# Patient Record
Sex: Female | Born: 1945
Health system: Southern US, Community
[De-identification: ages and names within clinical notes are randomized; demographics above are authoritative.]

## PROBLEM LIST (undated history)

## (undated) DIAGNOSIS — I700 Atherosclerosis of aorta: Secondary | ICD-10-CM

## (undated) DIAGNOSIS — Z87891 Personal history of nicotine dependence: Secondary | ICD-10-CM

## (undated) DIAGNOSIS — D500 Iron deficiency anemia secondary to blood loss (chronic): Secondary | ICD-10-CM

## (undated) DIAGNOSIS — K449 Diaphragmatic hernia without obstruction or gangrene: Secondary | ICD-10-CM

## (undated) DIAGNOSIS — E785 Hyperlipidemia, unspecified: Secondary | ICD-10-CM

## (undated) DIAGNOSIS — Z9889 Other specified postprocedural states: Secondary | ICD-10-CM

## (undated) DIAGNOSIS — Z7982 Long term (current) use of aspirin: Secondary | ICD-10-CM

## (undated) DIAGNOSIS — R0602 Shortness of breath: Secondary | ICD-10-CM

## (undated) DIAGNOSIS — M1990 Unspecified osteoarthritis, unspecified site: Secondary | ICD-10-CM

## (undated) DIAGNOSIS — I739 Peripheral vascular disease, unspecified: Secondary | ICD-10-CM

## (undated) DIAGNOSIS — R053 Chronic cough: Secondary | ICD-10-CM

## (undated) DIAGNOSIS — H9192 Unspecified hearing loss, left ear: Secondary | ICD-10-CM

## (undated) DIAGNOSIS — K257 Chronic gastric ulcer without hemorrhage or perforation: Secondary | ICD-10-CM

## (undated) DIAGNOSIS — I10 Essential (primary) hypertension: Secondary | ICD-10-CM

## (undated) DIAGNOSIS — K219 Gastro-esophageal reflux disease without esophagitis: Secondary | ICD-10-CM

## (undated) DIAGNOSIS — Z7902 Long term (current) use of antithrombotics/antiplatelets: Secondary | ICD-10-CM

## (undated) DIAGNOSIS — R05 Cough: Secondary | ICD-10-CM

## (undated) DIAGNOSIS — D649 Anemia, unspecified: Secondary | ICD-10-CM

## (undated) DIAGNOSIS — J42 Unspecified chronic bronchitis: Secondary | ICD-10-CM

## (undated) HISTORY — PX: BUNIONECTOMY: SHX129

## (undated) HISTORY — DX: Hyperlipidemia, unspecified: E78.5

## (undated) HISTORY — DX: Gastro-esophageal reflux disease without esophagitis: K21.9

## (undated) HISTORY — DX: Essential (primary) hypertension: I10

## (undated) HISTORY — PX: TUBAL LIGATION: SHX77

## (undated) HISTORY — PX: BREAST CYST ASPIRATION: SHX578

---
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
## 1976-04-16 HISTORY — PX: BREAST EXCISIONAL BIOPSY: SUR124

## 2000-04-16 LAB — HM COLONOSCOPY

## 2004-05-09 ENCOUNTER — Ambulatory Visit: Payer: Self-pay | Admitting: Internal Medicine

## 2005-02-16 ENCOUNTER — Ambulatory Visit: Payer: Self-pay | Admitting: Internal Medicine

## 2006-10-25 ENCOUNTER — Telehealth (INDEPENDENT_AMBULATORY_CARE_PROVIDER_SITE_OTHER): Payer: Self-pay | Admitting: *Deleted

## 2008-02-10 ENCOUNTER — Ambulatory Visit: Payer: Self-pay | Admitting: Family Medicine

## 2008-03-31 ENCOUNTER — Ambulatory Visit: Payer: Self-pay | Admitting: Family Medicine

## 2008-03-31 ENCOUNTER — Telehealth (INDEPENDENT_AMBULATORY_CARE_PROVIDER_SITE_OTHER): Payer: Self-pay | Admitting: *Deleted

## 2008-03-31 DIAGNOSIS — J069 Acute upper respiratory infection, unspecified: Secondary | ICD-10-CM | POA: Insufficient documentation

## 2008-04-04 ENCOUNTER — Encounter: Payer: Self-pay | Admitting: Family Medicine

## 2008-04-04 ENCOUNTER — Emergency Department: Payer: Self-pay | Admitting: Emergency Medicine

## 2008-04-06 ENCOUNTER — Ambulatory Visit: Payer: Self-pay | Admitting: Family Medicine

## 2008-04-06 DIAGNOSIS — I10 Essential (primary) hypertension: Secondary | ICD-10-CM | POA: Insufficient documentation

## 2008-04-06 DIAGNOSIS — R079 Chest pain, unspecified: Secondary | ICD-10-CM | POA: Insufficient documentation

## 2008-04-13 ENCOUNTER — Ambulatory Visit: Payer: Self-pay

## 2008-04-13 ENCOUNTER — Encounter: Payer: Self-pay | Admitting: Family Medicine

## 2008-04-19 ENCOUNTER — Ambulatory Visit: Payer: Self-pay | Admitting: Internal Medicine

## 2008-04-19 DIAGNOSIS — K219 Gastro-esophageal reflux disease without esophagitis: Secondary | ICD-10-CM | POA: Insufficient documentation

## 2008-04-22 LAB — CONVERTED CEMR LAB
ALT: 26 units/L (ref 0–35)
AST: 28 units/L (ref 0–37)
Albumin: 4.2 g/dL (ref 3.5–5.2)
Alkaline Phosphatase: 81 units/L (ref 39–117)
BUN: 26 mg/dL — ABNORMAL HIGH (ref 6–23)
Basophils Absolute: 0 10*3/uL (ref 0.0–0.1)
Basophils Relative: 0 % (ref 0.0–3.0)
Bilirubin, Direct: 0.1 mg/dL (ref 0.0–0.3)
CO2: 30 meq/L (ref 19–32)
Calcium: 9.3 mg/dL (ref 8.4–10.5)
Chloride: 102 meq/L (ref 96–112)
Cholesterol: 237 mg/dL (ref 0–200)
Creatinine, Ser: 1.5 mg/dL — ABNORMAL HIGH (ref 0.4–1.2)
Direct LDL: 174.1 mg/dL
Eosinophils Absolute: 0.1 10*3/uL (ref 0.0–0.7)
Eosinophils Relative: 1.3 % (ref 0.0–5.0)
GFR calc Af Amer: 45 mL/min
GFR calc non Af Amer: 37 mL/min
Glucose, Bld: 104 mg/dL — ABNORMAL HIGH (ref 70–99)
HCT: 44.1 % (ref 36.0–46.0)
HDL: 41.6 mg/dL (ref >39.0)
Hemoglobin: 15.1 g/dL — ABNORMAL HIGH (ref 12.0–15.0)
Lymphocytes Relative: 21 % (ref 12.0–46.0)
MCHC: 34.3 g/dL (ref 30.0–36.0)
MCV: 92.9 fL (ref 78.0–100.0)
Monocytes Absolute: 0.3 10*3/uL (ref 0.1–1.0)
Monocytes Relative: 4.1 % (ref 3.0–12.0)
Neutro Abs: 5.4 10*3/uL (ref 1.4–7.7)
Neutrophils Relative %: 73.6 % (ref 43.0–77.0)
Phosphorus: 3.3 mg/dL (ref 2.3–4.6)
Platelets: 265 10*3/uL (ref 150–400)
Potassium: 3.6 meq/L (ref 3.5–5.1)
RBC: 4.75 M/uL (ref 3.87–5.11)
RDW: 13.1 % (ref 11.5–14.6)
Sodium: 140 meq/L (ref 135–145)
TSH: 1.06 microintl units/mL (ref 0.35–5.50)
Total Bilirubin: 0.8 mg/dL (ref 0.3–1.2)
Total CHOL/HDL Ratio: 5.7
Total Protein: 6.6 g/dL (ref 6.0–8.3)
Triglycerides: 143 mg/dL (ref 0–149)
VLDL: 29 mg/dL (ref 0–40)
WBC: 7.4 10*3/uL (ref 4.5–10.5)

## 2008-10-25 ENCOUNTER — Ambulatory Visit: Payer: Self-pay | Admitting: Internal Medicine

## 2008-10-25 DIAGNOSIS — E785 Hyperlipidemia, unspecified: Secondary | ICD-10-CM | POA: Insufficient documentation

## 2008-10-27 LAB — CONVERTED CEMR LAB
Albumin: 4 g/dL (ref 3.5–5.2)
BUN: 26 mg/dL — ABNORMAL HIGH (ref 6–23)
CO2: 29 meq/L (ref 19–32)
Calcium: 9.7 mg/dL (ref 8.4–10.5)
Chloride: 103 meq/L (ref 96–112)
Creatinine, Ser: 1.1 mg/dL (ref 0.4–1.2)
Glucose, Bld: 85 mg/dL (ref 70–99)
Phosphorus: 4 mg/dL (ref 2.3–4.6)
Potassium: 3.9 meq/L (ref 3.5–5.1)
Sodium: 141 meq/L (ref 135–145)

## 2009-04-25 ENCOUNTER — Ambulatory Visit: Payer: Self-pay | Admitting: Internal Medicine

## 2009-04-25 DIAGNOSIS — N3000 Acute cystitis without hematuria: Secondary | ICD-10-CM | POA: Insufficient documentation

## 2009-04-25 LAB — CONVERTED CEMR LAB
Bilirubin Urine: NEGATIVE
Glucose, Urine, Semiquant: NEGATIVE
Ketones, urine, test strip: NEGATIVE
Nitrite: NEGATIVE
Specific Gravity, Urine: 1.01
Urobilinogen, UA: 0.2
pH: 6.5

## 2009-04-27 LAB — CONVERTED CEMR LAB
ALT: 24 units/L (ref 0–35)
AST: 26 units/L (ref 0–37)
Albumin: 4 g/dL (ref 3.5–5.2)
Alkaline Phosphatase: 69 units/L (ref 39–117)
BUN: 25 mg/dL — ABNORMAL HIGH (ref 6–23)
Basophils Absolute: 0.1 10*3/uL (ref 0.0–0.1)
Basophils Relative: 0.9 % (ref 0.0–3.0)
Bilirubin, Direct: 0 mg/dL (ref 0.0–0.3)
CO2: 31 meq/L (ref 19–32)
Calcium: 10.2 mg/dL (ref 8.4–10.5)
Chloride: 102 meq/L (ref 96–112)
Creatinine, Ser: 1.1 mg/dL (ref 0.4–1.2)
Eosinophils Absolute: 0.1 10*3/uL (ref 0.0–0.7)
Eosinophils Relative: 1.5 % (ref 0.0–5.0)
GFR calc non Af Amer: 53.09 mL/min (ref >60)
Glucose, Bld: 88 mg/dL (ref 70–99)
HCT: 44.3 % (ref 36.0–46.0)
Hemoglobin: 14.6 g/dL (ref 12.0–15.0)
Lymphocytes Relative: 20.2 % (ref 12.0–46.0)
Lymphs Abs: 1.7 10*3/uL (ref 0.7–4.0)
MCHC: 33 g/dL (ref 30.0–36.0)
MCV: 96.5 fL (ref 78.0–100.0)
Monocytes Absolute: 0.5 10*3/uL (ref 0.1–1.0)
Monocytes Relative: 5.8 % (ref 3.0–12.0)
Neutro Abs: 6 10*3/uL (ref 1.4–7.7)
Neutrophils Relative %: 71.6 % (ref 43.0–77.0)
Phosphorus: 3.2 mg/dL (ref 2.3–4.6)
Platelets: 233 10*3/uL (ref 150.0–400.0)
Potassium: 3.9 meq/L (ref 3.5–5.1)
RBC: 4.59 M/uL (ref 3.87–5.11)
RDW: 13.1 % (ref 11.5–14.6)
Sodium: 142 meq/L (ref 135–145)
TSH: 1.85 microintl units/mL (ref 0.35–5.50)
Total Bilirubin: 1 mg/dL (ref 0.3–1.2)
Total Protein: 7.2 g/dL (ref 6.0–8.3)
WBC: 8.4 10*3/uL (ref 4.5–10.5)

## 2009-05-13 ENCOUNTER — Ambulatory Visit: Payer: Self-pay | Admitting: Internal Medicine

## 2009-05-13 ENCOUNTER — Encounter: Payer: Self-pay | Admitting: Internal Medicine

## 2009-05-25 ENCOUNTER — Encounter: Payer: Self-pay | Admitting: Internal Medicine

## 2009-10-24 ENCOUNTER — Ambulatory Visit: Payer: Self-pay | Admitting: Internal Medicine

## 2009-10-24 DIAGNOSIS — F438 Other reactions to severe stress: Secondary | ICD-10-CM | POA: Insufficient documentation

## 2010-04-13 ENCOUNTER — Encounter: Payer: Self-pay | Admitting: Internal Medicine

## 2010-05-01 ENCOUNTER — Encounter: Payer: Self-pay | Admitting: Internal Medicine

## 2010-05-01 ENCOUNTER — Ambulatory Visit
Admission: RE | Admit: 2010-05-01 | Discharge: 2010-05-01 | Payer: Self-pay | Source: Home / Self Care | Attending: Internal Medicine | Admitting: Internal Medicine

## 2010-05-01 ENCOUNTER — Encounter (INDEPENDENT_AMBULATORY_CARE_PROVIDER_SITE_OTHER): Payer: Self-pay | Admitting: *Deleted

## 2010-05-01 ENCOUNTER — Other Ambulatory Visit: Payer: Self-pay | Admitting: Internal Medicine

## 2010-05-01 LAB — RENAL FUNCTION PANEL
Albumin: 4.2 g/dL (ref 3.5–5.2)
BUN: 19 mg/dL (ref 6–23)
CO2: 29 mEq/L (ref 19–32)
Calcium: 9.7 mg/dL (ref 8.4–10.5)
Chloride: 99 mEq/L (ref 96–112)
Creatinine, Ser: 1 mg/dL (ref 0.4–1.2)
GFR: 59.07 mL/min — ABNORMAL LOW (ref >60.00)
Glucose, Bld: 87 mg/dL (ref 70–99)
Phosphorus: 3.2 mg/dL (ref 2.3–4.6)
Potassium: 3.5 mEq/L (ref 3.5–5.1)
Sodium: 139 mEq/L (ref 135–145)

## 2010-05-01 LAB — CBC WITH DIFFERENTIAL/PLATELET
Basophils Absolute: 0 10*3/uL (ref 0.0–0.1)
Basophils Relative: 0.4 % (ref 0.0–3.0)
Eosinophils Absolute: 0.1 10*3/uL (ref 0.0–0.7)
Eosinophils Relative: 1 % (ref 0.0–5.0)
HCT: 47.2 % — ABNORMAL HIGH (ref 36.0–46.0)
Hemoglobin: 15.9 g/dL — ABNORMAL HIGH (ref 12.0–15.0)
Lymphocytes Relative: 17.6 % (ref 12.0–46.0)
Lymphs Abs: 1.8 10*3/uL (ref 0.7–4.0)
MCHC: 33.7 g/dL (ref 30.0–36.0)
MCV: 94.8 fl (ref 78.0–100.0)
Monocytes Absolute: 0.6 10*3/uL (ref 0.1–1.0)
Monocytes Relative: 5.4 % (ref 3.0–12.0)
Neutro Abs: 7.9 10*3/uL — ABNORMAL HIGH (ref 1.4–7.7)
Neutrophils Relative %: 75.6 % (ref 43.0–77.0)
Platelets: 274 10*3/uL (ref 150.0–400.0)
RBC: 4.97 Mil/uL (ref 3.87–5.11)
RDW: 13.9 % (ref 11.5–14.6)
WBC: 10.5 10*3/uL (ref 4.5–10.5)

## 2010-05-01 LAB — LDL CHOLESTEROL, DIRECT: Direct LDL: 187.2 mg/dL

## 2010-05-01 LAB — HEPATIC FUNCTION PANEL
ALT: 21 U/L (ref 0–35)
AST: 22 U/L (ref 0–37)
Albumin: 4.2 g/dL (ref 3.5–5.2)
Alkaline Phosphatase: 86 U/L (ref 39–117)
Bilirubin, Direct: 0.1 mg/dL (ref 0.0–0.3)
Total Bilirubin: 1 mg/dL (ref 0.3–1.2)
Total Protein: 6.9 g/dL (ref 6.0–8.3)

## 2010-05-01 LAB — TSH: TSH: 1.24 u[IU]/mL (ref 0.35–5.50)

## 2010-05-01 LAB — LIPID PANEL
Cholesterol: 258 mg/dL — ABNORMAL HIGH (ref 0–200)
HDL: 50.8 mg/dL (ref >39.00)
Total CHOL/HDL Ratio: 5
Triglycerides: 153 mg/dL — ABNORMAL HIGH (ref 0.0–149.0)
VLDL: 30.6 mg/dL (ref 0.0–40.0)

## 2010-05-03 ENCOUNTER — Encounter: Payer: Self-pay | Admitting: Internal Medicine

## 2010-05-16 NOTE — Letter (Signed)
Summary: Results Follow up Letter  [facility]  [address] [place], [place] [address]   Phone: [phone]  Fax: [phone]    05/25/2009 MRN: [id number]  Meghan Moody [address] [place], [place]  [address]  Dear [name],  The following are the results of your recent test(s):  Test         Result    Pap Smear:        Normal _____  Not Normal _____ Comments: ______________________________________________________ Cholesterol: LDL(Bad cholesterol):         Your goal is less than:         HDL (Good cholesterol):       Your goal is more than: Comments:  ______________________________________________________ Mammogram:        Normal __X___  Not Normal _____ Comments:mammo looks fine. Repeat is recommended in 1-2 years   ___________________________________________________________________ Hemoccult:        Normal _____  Not normal _______ Comments:    _____________________________________________________________________ Other Tests:    We routinely do not discuss normal results over the telephone.  If you desire a copy of the results, or you have any questions about this information we can discuss them at your next office visit.   Sincerely,     Tillman Abide, MD

## 2010-05-16 NOTE — Assessment & Plan Note (Signed)
Summary: CPX / LFW   Vital Signs:  Patient profile:   65 year old female Weight:      150 pounds Temp:     98.6 degrees F oral Pulse rate:   68 / minute Pulse rhythm:   regular BP sitting:   138 / 90  (left arm) Cuff size:   regular  Vitals Entered By: Mervin Hack CMA Duncan Dull) (April 25, 2009 11:54 AM) CC: adult physical   History of Present Illness: Urine checked Very yellow---takes a fair amount of vitamins Does have bad aroma No dysuria or hematuria  Otherwise fine Hasn't been to her gyn in some time Still hasn't been back to Dr Judge Stall care here--she prefers to defer to Dr Fenton Malling Will allow mammo though  Allergies: No Known Drug Allergies  Past History:  Past medical, surgical, family and social histories (including risk factors) reviewed for relevance to current acute and chronic problems.  Past Medical History: Reviewed history from 10/25/2008 and no changes required. Hypertension GERD Hyperlipidemia  Past Surgical History: Reviewed history from 04/19/2008 and no changes required. Tubal ligation Bunionectomy  Family History: Reviewed history from 04/19/2008 and no changes required. Dad died of stroke in 45's Mom with CAD 2 brothers--1 with drug addiction problems  Social History: Reviewed history from 10/25/2008 and no changes required. 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 Eyes:  Denies double vision and vision loss-1 eye. ENT:  Denies decreased hearing and ringing in ears; teeth okay--regular with dentist. CV:  Denies chest pain or discomfort, difficulty breathing at night, difficulty breathing while lying down, fainting, lightheadness, near fainting,  palpitations, and shortness of breath with exertion. Resp:  Complains of cough; denies shortness of breath; occ mild cough. GI:  Complains of indigestion; denies abdominal pain, bloody stools, change in bowel habits, and dark tarry stools; takes prilosec--controls heartburn unless she misses. GU:  See HPI; No sex recently--had dyspareunia. MS:  Denies joint pain and joint swelling. Derm:  Complains of lesion(s); denies rash; moles on abdomen and other places Palpable lesion left low back--not sure if it is concerning. Neuro:  Complains of tingling; denies headaches, numbness, and weakness; occ tingling in right arm in bed some cramps in thighs and calves at night. Psych:  Denies anxiety and depression. Heme:  Denies abnormal bruising and enlarge lymph nodes. Allergy:  Denies seasonal allergies and sneezing.  Physical Exam  General:  alert and normal appearance.   Eyes:  pupils equal, pupils round, pupils reactive to light, and no optic disk abnormalities.   Ears:  R ear normal and L ear normal.   Mouth:  no erythema and no lesions.   Neck:  supple, no masses, no thyromegaly, no carotid bruits, and no cervical lymphadenopathy.   Breasts:  deferred at patient's request Lungs:  normal respiratory effort and normal breath sounds.   Heart:  normal rate, regular rhythm, no murmur, and no gallop.   Abdomen:  soft, non-tender, and no masses.   Genitalia:  deferrred at patient's request Msk:  no joint tenderness and no joint swelling.   Pulses:  1+ in feet Extremities:  no edema Neurologic:  alert & oriented X3, strength normal in all extremities, and gait normal.   Skin:  seb keratosis on lower left back No worrisone lesions Axillary Nodes:  No palpable lymphadenopathy Psych:  normally interactive, good eye contact, not anxious appearing, and not depressed appearing.     Impression & Recommendations:  Problem # 1:  PREVENTIVE HEALTH CARE (ICD-V70.0) Assessment Comment Only will do  mammo  Up to date on colon defers Pap  Problem # 2:  HYPERTENSION (ICD-401.9) Assessment: Unchanged  reasonable control no changes needed  Her updated medication list for this problem includes:    Hydrochlorothiazide 25 Mg Tabs (Hydrochlorothiazide) .Marland Kitchen... Take 1 tablet by mouth once daily  BP today: 138/90 Prior BP: 128/80 (10/25/2008)  Labs Reviewed: K+: 3.9 (10/25/2008) Creat: : 1.1 (10/25/2008)   Chol: 237 (04/19/2008)   HDL: 41.6 (04/19/2008)   LDL: DEL (04/19/2008)   TG: 143 (04/19/2008)  Orders: Venipuncture ([address]) TLB-Renal Function Panel (80069-RENAL) TLB-CBC Platelet - w/Differential (85025-CBCD) TLB-Hepatic/Liver Function Pnl (80076-HEPATIC) TLB-TSH (Thyroid Stimulating Hormone) (84443-TSH)  Problem # 3:  ACUTE CYSTITIS (ICD-595.0) Assessment: New  will give 3 days of Rx  Her updated medication list for this problem includes:    Ciprofloxacin Hcl 250 Mg Tabs (Ciprofloxacin hcl) .Marland Kitchen... 1 tab by mouth two times a day for bladder infection  Orders: UA Dipstick W/ Micro (manual) (98119)  Problem # 4:  HYPERLIPIDEMIA (ICD-272.4) Assessment: Unchanged no Rx  Labs Reviewed: SGOT: 28 (04/19/2008)   SGPT: 26 (04/19/2008)   HDL:41.6 (04/19/2008)  LDL:DEL (04/19/2008)  Chol:237 (04/19/2008)  Trig:143 (04/19/2008)  Complete Medication List: 1)  Prilosec Otc 20 Mg Tbec (Omeprazole magnesium) .Marland Kitchen.. 1 by mouth bid 2)  Proair Hfa 108 (90 Base) Mcg/act Aers (Albuterol sulfate) .... 2 inh q4h as needed shortness of breath 3)  Hydrochlorothiazide 25 Mg Tabs (Hydrochlorothiazide) .... Take 1 tablet by mouth once daily 4)  B Complex Tabs (B complex vitamins) .... Take 1 by mouth once daily 5)  Bayer Low Strength 81 Mg Tbec (Aspirin) .... Take 1 by mouth once daily 6)  Centrum Silver Tabs (Multiple vitamins-minerals) .... Take 1 by mouth once daily 7)  Vitamin D3 1000 Unit Tabs (Cholecalciferol) .... Take 1 by mouth once daily 8)  Fish Oil 1000 Mg Caps (Omega-3 fatty acids)  .... Take 1 by mouth once daily 9)  Ciprofloxacin Hcl 250 Mg Tabs (Ciprofloxacin hcl) .Marland Kitchen.. 1 tab by mouth two times a day for bladder infection  Other Orders: Radiology Referral (Radiology)  Patient Instructions: 1)  Please schedule a follow-up appointment in 6 months .  2)  Schedule your mammogram.  Prescriptions: CIPROFLOXACIN HCL 250 MG TABS (CIPROFLOXACIN HCL) 1 tab by mouth two times a day for bladder infection  #6 x 0   Entered and Authorized by:   Cindee Salt MD   Signed by:   Cindee Salt MD on 04/25/2009   Method used:   Electronically to        [facility] [id number] (retail)       [address]       [place], [place]  [address]       Ph: [phone] or [phone]       Fax: [phone]   RxID:   [id number]   Current Allergies (reviewed today): No known allergies   Laboratory Results   Urine Tests  Date/Time Received: April 25, 2009 12:15 PM Date/Time Reported: April 25, 2009 12:15 PM  Routine Urinalysis   Color: yellow Appearance: Cloudy Glucose: negative   (Normal Range: Negative) Bilirubin: negative   (Normal Range: Negative) Ketone: negative   (  Normal Range: Negative) Spec. Gravity: 1.010   (Normal Range: 1.003-1.035) Blood: moderate   (Normal Range: Negative) pH: 6.5   (Normal Range: 5.0-8.0) Protein: trace   (Normal Range: Negative) Urobilinogen: 0.2   (Normal Range: 0-1) Nitrite: negative   (Normal Range: Negative) Leukocyte Esterace: moderate   (Normal Range: Negative)  Urine Microscopic WBC/HPF: 5-10 RBC/HPF: 1-3 Bacteria/HPF: packed Epithelial/HPF: none

## 2010-05-16 NOTE — Assessment & Plan Note (Signed)
Summary: 6 MONTH FOLLOW UP/RBH   Vital Signs:  Patient profile:   65 year old female Weight:      148 pounds BMI:     23.97 Temp:     98.7 degrees F oral Pulse rate:   60 / minute Pulse rhythm:   regular BP sitting:   128 / 80  (left arm) Cuff size:   regular  Vitals Entered By: Mervin Hack CMA Duncan Dull) (October 24, 2009 3:40 PM) CC: 6 month follow-up   History of Present Illness: doing okay Husband has just been diagnosed with prostate cancer Biopsy recently done 12/12 biopsy positive Gleason 6 mostly  Stress from husband's trial also He is having trouble dealing with all this and this carries over to her  Checks BP occ usually 128/80 No headaches NO chest pain No SOB  stomach quiet  continues on medication   Allergies: No Known Drug Allergies  Past History:  Past medical, surgical, family and social histories (including risk factors) reviewed for relevance to current acute and chronic problems.  Past Medical History: Reviewed history from 10/25/2008 and no changes required. Hypertension GERD Hyperlipidemia  Past Surgical History: Reviewed history from 04/19/2008 and no changes required. Tubal ligation Bunionectomy  Family History: Reviewed history from 04/19/2008 and no changes required. Dad died of stroke in 77's Mom with CAD 2 brothers--1 with drug addiction problems  Social History: Reviewed history from 10/25/2008 and no changes required. 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   Neck:  supple, no masses, no thyromegaly, no carotid bruits,  and no cervical lymphadenopathy.   Lungs:  normal respiratory effort and normal breath sounds.   Heart:  normal rate, regular rhythm, no murmur, and no gallop.   Abdomen:  soft and non-tender.   Msk:  no joint tenderness and no joint swelling.   Extremities:  no edema Psych:  normally interactive, good eye contact, not anxious appearing, and not depressed appearing.     Impression & Recommendations:  Problem # 1:  ANXIETY, SITUATIONAL (ICD-308.3) Assessment New issues with her husband she is dealing with this okay  Problem # 2:  HYPERTENSION (ICD-401.9) Assessment: Unchanged doing well no changes needed  Her updated medication list for this problem includes:    Hydrochlorothiazide 25 Mg Tabs (Hydrochlorothiazide) .Marland Kitchen... Take 1 tablet by mouth once daily  BP today: 128/80 Prior BP: 138/90 (04/25/2009)  Labs Reviewed: K+: 3.9 (04/25/2009) Creat: : 1.1 (04/25/2009)   Chol: 237 (04/19/2008)   HDL: 41.6 (04/19/2008)   LDL: DEL (04/19/2008)   TG: 143 (04/19/2008)  Problem # 3:  GERD (ICD-530.81) Assessment: Unchanged doing well no changes needed  Her updated medication list for this problem includes:    Prilosec Otc 20 Mg Tbec (Omeprazole magnesium) .Marland Kitchen... 1 by mouth bid  Complete Medication List: 1)  Prilosec Otc 20 Mg Tbec (Omeprazole magnesium) .Marland Kitchen.. 1 by mouth bid 2)  Proair Hfa 108 (90 Base) Mcg/act Aers (Albuterol sulfate) .... 2 inh q4h as needed shortness of breath 3)  Hydrochlorothiazide 25 Mg Tabs (Hydrochlorothiazide) .... Take 1 tablet by mouth once daily 4)  B Complex Tabs (B complex vitamins) .... Take 1 by mouth once daily 5)  Bayer Low Strength 81 Mg Tbec (Aspirin) .... Take 1 by mouth once daily 6)  Centrum Silver Tabs (Multiple vitamins-minerals) .... Take 1 by mouth once daily 7)  Vitamin D3 1000 Unit Tabs (Cholecalciferol) .... Take 1 by mouth once daily 8)  Fish Oil 1000 Mg Caps (Omega-3 fatty acids) .... Take 1 by mouth once daily 9)  Vitamin B-12 1000 Mcg  Tabs (Cyanocobalamin) .... Take 1 by mouth once daily as needed 10)  Benzonatate 200 Mg Caps (Benzonatate) .... Take 1 by mouth three times a day as needed  Patient Instructions: 1)  Please schedule a follow-up appointment in 6 months for Medicare physical  Current Allergies (reviewed today): No known allergies

## 2010-05-18 NOTE — Letter (Signed)
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

## 2010-05-18 NOTE — Miscellaneous (Signed)
Summary: Pap results   Clinical Lists Changes  Observations: Added new observation of PAP SMEAR: normal (04/13/2010 14:58)      Preventive Care Screening  Pap Smear:    Date:  04/13/2010    Results:  normal

## 2010-05-18 NOTE — Miscellaneous (Signed)
Summary: FLU VACCINE   Clinical Lists Changes  Observations: Added new observation of FLU VAX: Historical (05/01/2010 14:48)      Influenza Immunization History:    Influenza # 1:  Historical (05/01/2010)

## 2010-05-18 NOTE — Letter (Signed)
Summary: Pre Visit Letter Revised  [facility]  [address] [place], [place] [address]   Phone: [phone]  Fax: [phone]        05/01/2010 MRN: [id number] LEXYS MILLINER [address] [place], [place]  [address]             Procedure Date:  06-05-10   Welcome to the Gastroenterology Division at [facility].    You are scheduled to see a nurse for your pre-procedure visit on 05-22-10 at 10:30a.m. on the 3rd floor at [facility], [address]  We ask that you try to arrive at our office 15 minutes prior to your appointment time to allow for check-in.  Please take a minute to review the attached form.  If you answer "Yes" to one or more of the questions on the first page, we ask that you call the person listed at your earliest opportunity.  If you answer "No" to all of the questions, please complete the rest of the form and bring it to your appointment.    Your nurse visit will consist of discussing your medical and surgical history, your immediate family medical history, and your medications.   If you are unable to list all of your medications on the form, please bring the medication bottles to your appointment and we will list them.  We will need to be aware of both prescribed and over the counter drugs.  We will need to know exact dosage information as well.    Please be prepared to read and sign documents such as consent forms, a financial agreement, and acknowledgement forms.  If necessary, and with your consent, a friend or relative is welcome to sit-in on the nurse visit with you.  Please bring your insurance card so that we may make a copy of it.  If your insurance requires a referral to see a specialist, please bring your referral form from your primary care physician.  No co-pay is required for this nurse visit.     If you cannot keep your appointment, please call [phone] to cancel or reschedule prior to your appointment date.  This allows [place] the  opportunity to schedule an appointment for another patient in need of care.    Thank you for choosing  Gastroenterology for your medical needs.  We appreciate the opportunity to care for you.  Please visit [place] at our website  to learn more about our practice.  Sincerely, The Gastroenterology Division

## 2010-05-18 NOTE — Assessment & Plan Note (Signed)
Summary: MEDICARE CPX/DLO   Vital Signs:  Patient profile:   65 year old female Height:      65 inches Weight:      143 pounds Temp:     98.0 degrees F oral Pulse rate:   70 / minute Pulse rhythm:   regular BP sitting:   147 / 93  (left arm) Cuff size:   regular  Vitals Entered By: Mervin Hack CMA Duncan Dull) (May 01, 2010 11:51 AM) CC: adult physical   History of Present Illness: Husband died 1 month ago has been having good days and bad Feels that her grieving is fairly normal They had sig independent lives so this is easier for her  Saw Dr Toya Smothers Pap smear done--always normal mammo due next month due for colonoscopy    Allergies: No Known Drug Allergies  Past History:  Past medical, surgical, family and social histories (including risk factors) reviewed for relevance to current acute and chronic problems.  Past Medical History: Reviewed history from 10/25/2008 and no changes required. Hypertension GERD Hyperlipidemia  Past Surgical History: Reviewed history from 04/19/2008 and no changes required. Tubal ligation Bunionectomy  Family History: Reviewed history from 04/19/2008 and no changes required. 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 Trying to change habits wears seat belt. Eyes:  Denies double vision and vision loss-1 eye. 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 CV:  Denies chest pain or discomfort, difficulty breathing at night, difficulty breathing while lying down, fainting, lightheadness,  palpitations, shortness of breath with exertion, and swelling of feet. Resp:  Denies cough and shortness of breath. GI:  Complains of indigestion; denies abdominal pain, bloody stools, change in bowel habits, dark tarry stools, nausea, and vomiting; some heartburn if she forgets prilosec. GU:  Complains of incontinence; denies dysuria; occ slight urge incontinence. not bad enough to need pad no sexual problems---discussed safe sex if she has new relationship. MS:  Complains of joint pain and low back pain; denies joint swelling; occ finger pain--no meds some low back pain afer mopping or doing yard work---discussed back fitness and symptom care. Derm:  Denies lesion(s) and rash. Neuro:  Denies headaches, numbness, tingling, and weakness. Psych:  Denies anxiety and depression; grieving now but doing okay. Heme:  Denies abnormal bruising and enlarge lymph nodes. Allergy:  Denies seasonal allergies and sneezing.  Physical Exam  General:  alert and normal appearance.   Eyes:  pupils equal, pupils round, pupils reactive to light, and no optic disk abnormalities.   Ears:  R ear normal and L ear normal.   Mouth:  no erythema, no exudates, and no lesions.   Neck:  supple, no masses, no thyromegaly, no carotid bruits, and no cervical lymphadenopathy.   Lungs:  normal respiratory effort, no intercostal retractions, no accessory muscle use, and normal breath sounds.   Heart:  normal rate, regular rhythm, no murmur, and no gallop.   Abdomen:  soft, non-tender, and no masses.   Msk:  no joint tenderness and no joint swelling.   Pulses:  1+  in feet Extremities:  no edema Neurologic:  alert & oriented X3, strength normal in all extremities, and gait normal.   Skin:  no rashes and no suspicious lesions.   Axillary Nodes:  No palpable lymphadenopathy Psych:  normally interactive, good eye contact, not anxious appearing, and not depressed appearing.     Impression & Recommendations:  Problem # 1:   PREVENTIVE HEALTH CARE (ICD-V70.0) Assessment Comment Only due for pneumovax will go today to [facility] for flu vaccine Rx given for zostavax will set up direct colonoscopy mammo is scheduled  Problem # 2:  HYPERTENSION (ICD-401.9) Assessment: Unchanged  up a bit but lots of stress no changes for now check labs  Her updated medication list for this problem includes:    Hydrochlorothiazide 25 Mg Tabs (Hydrochlorothiazide) .Marland Kitchen... Take 1 tablet by mouth once daily  BP today: 147/93 Prior BP: 128/80 (10/24/2009)  Labs Reviewed: K+: 3.9 (04/25/2009) Creat: : 1.1 (04/25/2009)   Chol: 237 (04/19/2008)   HDL: 41.6 (04/19/2008)   LDL: DEL (04/19/2008)   TG: 143 (04/19/2008)  Orders: TLB-Renal Function Panel (80069-RENAL) TLB-CBC Platelet - w/Differential (85025-CBCD) TLB-Hepatic/Liver Function Pnl (80076-HEPATIC) TLB-TSH (Thyroid Stimulating Hormone) (84443-TSH) Venipuncture ([address])  Problem # 3:  GERD (ICD-530.81) Assessment: Unchanged okay with meds  Her updated medication list for this problem includes:    Prilosec Otc 20 Mg Tbec (Omeprazole magnesium) .Marland Kitchen... 1 by mouth bid  Problem # 4:  ANXIETY, SITUATIONAL (ICD-308.3) Assessment: Deteriorated now with grieving seems to be doing okay with this though  Complete Medication List: 1)  Prilosec Otc 20 Mg Tbec (Omeprazole magnesium) .Marland Kitchen.. 1 by mouth bid 2)  Hydrochlorothiazide 25 Mg Tabs (Hydrochlorothiazide) .... Take 1 tablet by mouth once daily 3)  B Complex Tabs (B complex vitamins) .... Take 1 by mouth once daily 4)  Bayer Low Strength 81 Mg Tbec (Aspirin) .... Take 1 by mouth once daily 5)  Centrum Silver Tabs (Multiple vitamins-minerals) .... Take 1 by mouth once daily 6)  Vitamin D3 1000 Unit Tabs (Cholecalciferol) .... Take 1 by mouth once daily 7)  Fish Oil 1000 Mg Caps (Omega-3 fatty acids) .... Take 1 by mouth once daily 8)  Vitamin B-12 1000 Mcg Tabs (Cyanocobalamin) .... Take 1 by mouth once daily as needed 9)   Benzonatate 200 Mg Caps (Benzonatate) .... Take 1 by mouth three times a day as needed  Other Orders: TLB-Lipid Panel (80061-LIPID) Pneumococcal Vaccine (60[address]) Admin 1st Vaccine (09811) Gastroenterology Referral (GI)  Patient Instructions: 1)  Please schedule a follow-up appointment in 6 months .  2)  Schedule a colonoscopy/ sigmoidoscopy to help detect colon cancer.    Orders Added: 1)  Est. Patient 65& > [99397] 2)  TLB-Renal Function Panel [80069-RENAL] 3)  TLB-CBC Platelet - w/Differential [85025-CBCD] 4)  TLB-Hepatic/Liver Function Pnl [80076-HEPATIC] 5)  TLB-TSH (Thyroid Stimulating Hormone) [84443-TSH] 6)  Venipuncture [91478] 7)  TLB-Lipid Panel [80061-LIPID] 8)  Pneumococcal Vaccine [90732] 9)  Admin 1st Vaccine [90471] 10)  Gastroenterology Referral [GI]   Immunizations Administered:  Pneumonia Vaccine:    Vaccine Type: Pneumovax    Site: right deltoid    Mfr: Merck    Dose: 0.5 ml    Route: IM    Given by: Mervin Hack CMA (AAMA)    Exp. Date: 08/24/2011    Lot #: [id number]    VIS given: 03/21/09 version given May 01, 2010.   Immunizations Administered:  Pneumonia Vaccine:    Vaccine Type: Pneumovax    Site: right deltoid  Mfr: Merck    Dose: 0.5 ml    Route: IM    Given by: Mervin Hack CMA (AAMA)    Exp. Date: 08/24/2011    Lot #: [id number]    VIS given: 03/21/09 version given May 01, 2010.  Current Allergies (reviewed today): No known allergies

## 2010-05-31 ENCOUNTER — Ambulatory Visit: Payer: Self-pay | Admitting: Obstetrics and Gynecology

## 2010-06-05 ENCOUNTER — Other Ambulatory Visit: Payer: Self-pay | Admitting: Gastroenterology

## 2010-08-24 ENCOUNTER — Other Ambulatory Visit: Payer: Self-pay | Admitting: *Deleted

## 2010-08-24 MED ORDER — OMEPRAZOLE MAGNESIUM 20 MG PO TBEC: 20.0000 mg | DELAYED_RELEASE_TABLET | Freq: Two times a day (BID) | ORAL | Status: DC

## 2010-08-24 NOTE — Telephone Encounter (Signed)
rx called into pharmacy

## 2010-09-22 ENCOUNTER — Telehealth: Payer: Self-pay | Admitting: *Deleted

## 2010-09-22 NOTE — Telephone Encounter (Signed)
Patient says that she been having terrible pain in her neck for a few months and started seeing a chiropractor and they have suggested that she come in for an xray. Patient is asking if she can come in for an xray here. Please advise.

## 2010-09-23 NOTE — Telephone Encounter (Signed)
Really needs to have an appt  I can do the x-ray then if appropriate

## 2010-09-25 NOTE — Telephone Encounter (Signed)
Patient called back and scheduled appt 

## 2010-09-25 NOTE — Telephone Encounter (Signed)
.  left message to have patient return my call.  

## 2010-10-04 ENCOUNTER — Encounter: Payer: Self-pay | Admitting: Internal Medicine

## 2010-10-06 ENCOUNTER — Ambulatory Visit (INDEPENDENT_AMBULATORY_CARE_PROVIDER_SITE_OTHER): Payer: Medicare Other | Admitting: Internal Medicine

## 2010-10-06 ENCOUNTER — Ambulatory Visit: Payer: Self-pay | Admitting: Internal Medicine

## 2010-10-06 ENCOUNTER — Encounter: Payer: Self-pay | Admitting: Internal Medicine

## 2010-10-06 VITALS — BP 120/70 | HR 74 | Temp 98.6°F | Ht 65.0 in | Wt 144.0 lb

## 2010-10-06 DIAGNOSIS — G5690 Unspecified mononeuropathy of unspecified upper limb: Secondary | ICD-10-CM

## 2010-10-06 NOTE — Progress Notes (Signed)
  Subjective:    Patient ID: Meghan Moody, female    DOB: 12-08-1945, 65 y.o.   MRN: [id number]  HPI Having numbness in left arm First noticed while doing pull ups in gym--started 4-5 months ago Started with left thumb numbness Gets heavy feeling along arm and forearm down to radial wrist  Neck is somewhat stiff though fair motion Notices the numbness with certain motions---like hugging someone Has slight sensation like shoulder going out of place but no actual dislocation No arm weakness No sig pain  Went to chiropractor Neck felt better but limited by insurance (he couldn't do x-rays)  Current Outpatient Prescriptions on File Prior to Visit  Medication Sig Dispense Refill  . aspirin 81 MG tablet Take 81 mg by mouth daily.        Marland Kitchen b complex vitamins tablet Take 1 tablet by mouth daily.        . Cholecalciferol (VITAMIN D3) 1000 UNITS CAPS Take by mouth daily.        . fish oil-omega-3 fatty acids 1000 MG capsule Take 2 g by mouth daily.        . hydrochlorothiazide 25 MG tablet Take 25 mg by mouth daily.        . Multiple Vitamins-Minerals (CENTRUM SILVER PO) Take by mouth daily.        Marland Kitchen omeprazole (PRILOSEC OTC) 20 MG tablet Take 1 tablet (20 mg total) by mouth 2 (two) times daily.  60 tablet  6  . vitamin B-12 (CYANOCOBALAMIN) 1000 MCG tablet Take 1,000 mcg by mouth daily.         Past Medical History  Diagnosis Date  . Hypertension   . GERD (gastroesophageal reflux disease)   . Hyperlipidemia     Past Surgical History  Procedure Date  . Tubal ligation   . Bunionectomy     Family History  Problem Relation Age of Onset  . Coronary artery disease Mother   . Stroke Father   . Drug abuse Brother     History   Social History  . Marital Status: Single    Spouse Name: N/A    Number of Children: 1  . Years of Education: N/A   Occupational History  . 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 Smoking status: Former Games developer  .  Smokeless tobacco: Never Used  . Alcohol Use: Yes  . Drug Use: No  . Sexually Active: Not on file   Other Topics Concern  . Not on file   Social History Narrative  . No narrative on file   Review of Systems No chest pain No SOb No change in stamina or exercise tolerance     Objective:   Physical Exam  Constitutional: She appears well-developed and well-nourished. No distress.  Neck:       Normal rotation and flexion/extension Mild restriction of tilt--more to left than right Mild tightness in trapezii  Musculoskeletal: Normal range of motion. She exhibits no tenderness.       Some crepitus in left shoulder Normal ROM in shoulders  Neurological:       Normal strength, tone and bulk in arms Reflexes symmetric in arms Normal sensation          Assessment & Plan:

## 2010-10-06 NOTE — Patient Instructions (Addendum)
Please try naproxen 220mg  (aleve) 1-2 twice a day to see if it helps the numbness Call if worsens Cancel July visit

## 2010-10-06 NOTE — Assessment & Plan Note (Addendum)
Unlikely to be cervical disc problem--more likely peripheral like near brachial plexus No weakness or sig pain Discussed avoiding activities that exacerbate ?heat Try NSAIDs No reason for imaging now If worsens, would try steroids orally and then  would consider referral or MRI

## 2010-10-30 ENCOUNTER — Telehealth: Payer: Self-pay | Admitting: *Deleted

## 2010-10-30 ENCOUNTER — Ambulatory Visit: Payer: Self-pay | Admitting: Internal Medicine

## 2010-10-30 MED ORDER — BENZONATATE 200 MG PO CAPS: 200.0000 mg | ORAL_CAPSULE | Freq: Three times a day (TID) | ORAL | Status: DC | PRN

## 2010-10-30 NOTE — Telephone Encounter (Signed)
Pt is asking for a refill on tesselon.  She had this filled 04/2008 for 60 and she says she takes this as needed for a cough.  She has a cough now, with a little sore throat and she is asking that a refill be called to [facility].  She states you had told her that if she ever needed a refill to just call.

## 2010-10-30 NOTE — Telephone Encounter (Signed)
Okay to refill #60 x 0 1 tid prn for cough  Have her set up appt if sig fever, shortness of breath or cough is not improving after a week or so

## 2010-10-30 NOTE — Telephone Encounter (Signed)
rx sent to pharmacy by e-script Spoke with patient and advised results   

## 2010-11-22 ENCOUNTER — Other Ambulatory Visit: Payer: Self-pay | Admitting: Internal Medicine

## 2010-11-27 ENCOUNTER — Ambulatory Visit (INDEPENDENT_AMBULATORY_CARE_PROVIDER_SITE_OTHER): Payer: Medicare Other | Admitting: Family Medicine

## 2010-11-27 ENCOUNTER — Encounter: Payer: Self-pay | Admitting: Family Medicine

## 2010-11-27 VITALS — BP 120/70 | HR 73 | Temp 98.2°F | Ht 66.0 in | Wt 145.1 lb

## 2010-11-27 DIAGNOSIS — M549 Dorsalgia, unspecified: Secondary | ICD-10-CM

## 2010-11-27 MED ORDER — CARISOPRODOL 350 MG PO TABS: 350.0000 mg | ORAL_TABLET | Freq: Four times a day (QID) | ORAL | Status: DC | PRN

## 2010-11-27 MED ORDER — IBUPROFEN 800 MG PO TABS: 800.0000 mg | ORAL_TABLET | Freq: Three times a day (TID) | ORAL | Status: DC | PRN

## 2010-11-27 NOTE — Progress Notes (Signed)
  Subjective:    Patient ID: Meghan Moody, female    DOB: Feb 02, 1946, 65 y.o.   MRN: [id number]  HPI  Meghan Moody, a 65 y.o. female presents today in the office for the following:    Was getting ready to go to church and felt like about six inches of her back fell to the floor. Feeling better some today. Was still hurting a lot around six i the morning.   Did not go to the emergency room -- taking some Soma and Ibuprofen.   Back Pain: Patient presents for presents evaluation of low back problems.  Symptoms have been present for 2 days and include pain in low back (dull in character; 3/10 in severity) and stiffness in low back. Initial inciting event: bending over. Symptoms are worst: morning. Alleviating factors identifiable by patient are medication motrin and soma. Exacerbating factors identifiable by patient are bending forwards. Treatments so far initiated by patient: motrin, soma Previous lower back problems: none. Previous workup: none. Previous treatments: none.  The PMH, PSH, Social History, Family History, Medications, and allergies have been reviewed in [facility] and have been updated if relevant.  Review of Systems REVIEW OF SYSTEMS  GEN: No fevers, chills. Nontoxic. Primarily MSK c/o today. MSK: Detailed in the HPI GI: tolerating PO intake without difficulty Neuro: Occ prior L arm numbness with motion Otherwise the pertinent positives of the ROS are noted above.      Objective:   Physical Exam   Physical Exam  Blood pressure 120/70, pulse 73, temperature 98.2 F (36.8 C), temperature source Oral, height 5\' 6"  (1.676 m), weight 145 lb 1.9 oz (65.826 kg), SpO2 98.00%.  Gen: Well-developed,well-nourished,in no acute distress; alert,appropriate and cooperative throughout examination HEENT: Normocephalic and atraumatic without obvious abnormalities.  Ears, externally no deformities Pulm: Breathing comfortably in no respiratory distress Range of motion at  the waist: Flexion:  normal Extension: normal Lateral bending: normal Rotation: all normal  No echymosis or edema Rises to examination table with no difficulty Gait: non antalgic  Inspection/Deformity: N Paraspinus T: Y - B S1 - L3  B Ankle Dorsiflexion (L5,4): 5/5 B Great Toe Dorsiflexion (L5,4): 5/5 Heel Walk (L5): WNL Toe Walk (S1): WNL Rise/Squat (L4): WNL  SENSORY B Medial Foot (L4): WNL B Dorsum (L5): WNL B Lateral (S1): WNL Light Touch: WNL  B SLR, seated: neg B SLR, supine: neg B FABER: neg B Reverse FABER: neg B Greater Troch: NT B Log Roll: neg B Stork: NT B Sciatic Notch: NT Leg Lengths: equal       Assessment & Plan:   1. Back pain    Motrin, Soma  I reviewed with the patient the structures involved and how they related to their diagnosis.   Conservative algorithms for acute back pain generally begin with the following: NSAIDS Muscle Relaxants  Start with medications, core rehab, and progress from there following low back pain algorithm.  No red flags are present.

## 2011-02-19 ENCOUNTER — Other Ambulatory Visit: Payer: Self-pay | Admitting: *Deleted

## 2011-02-19 MED ORDER — BENZONATATE 200 MG PO CAPS: 200.0000 mg | ORAL_CAPSULE | Freq: Three times a day (TID) | ORAL | Status: DC | PRN

## 2011-02-19 NOTE — Telephone Encounter (Signed)
Rx sent electronically.  

## 2011-03-16 ENCOUNTER — Telehealth: Payer: Self-pay | Admitting: Internal Medicine

## 2011-03-16 NOTE — Telephone Encounter (Signed)
Pt has fever blister.  Can a prescription be called in to [facility]?  Call back is [phone].  Thanks

## 2011-03-19 ENCOUNTER — Other Ambulatory Visit: Payer: Self-pay | Admitting: *Deleted

## 2011-03-19 MED ORDER — OMEPRAZOLE MAGNESIUM 20 MG PO TBEC: 20.0000 mg | DELAYED_RELEASE_TABLET | Freq: Two times a day (BID) | ORAL | Status: DC

## 2011-03-19 NOTE — Telephone Encounter (Signed)
Spoke with patient and she stated that she used Carmax and some other OTC cold sore ointment and the fever blister is clearing.  Patient stated that she has a Rx to get Zostavax from her pharmacy but the Rx isn't recent.  Advised patient that if the pharmacy won't take that Rx then call us back and we can send in a new Rx.

## 2011-05-17 ENCOUNTER — Ambulatory Visit (INDEPENDENT_AMBULATORY_CARE_PROVIDER_SITE_OTHER): Payer: Medicare Other | Admitting: Internal Medicine

## 2011-05-17 ENCOUNTER — Encounter: Payer: Self-pay | Admitting: Internal Medicine

## 2011-05-17 VITALS — BP 126/76 | HR 80 | Temp 98.1°F | Resp 20 | Ht 65.5 in | Wt 150.2 lb

## 2011-05-17 DIAGNOSIS — K219 Gastro-esophageal reflux disease without esophagitis: Secondary | ICD-10-CM

## 2011-05-17 DIAGNOSIS — R059 Cough, unspecified: Secondary | ICD-10-CM

## 2011-05-17 DIAGNOSIS — I10 Essential (primary) hypertension: Secondary | ICD-10-CM

## 2011-05-17 DIAGNOSIS — R05 Cough: Secondary | ICD-10-CM

## 2011-05-17 DIAGNOSIS — Z0000 Encounter for general adult medical examination without abnormal findings: Secondary | ICD-10-CM

## 2011-05-17 MED ORDER — BENZONATATE 200 MG PO CAPS: 200.0000 mg | ORAL_CAPSULE | Freq: Three times a day (TID) | ORAL | Status: DC | PRN
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
## 2011-05-17 NOTE — Assessment & Plan Note (Signed)
Generally doing well New relationship so she is upbeat and involved Will give Rx again for zostavax Not sure about colon so will do immunoassay Getting mammo soon

## 2011-05-17 NOTE — Assessment & Plan Note (Signed)
BP Readings from Last 3 Encounters:  05/17/11 126/76  11/27/10 120/70  10/06/10 120/70   Good control No change Due for labs

## 2011-05-17 NOTE — Assessment & Plan Note (Signed)
Good control on prilosec Triggers chronic cough so needs to continue

## 2011-05-17 NOTE — Patient Instructions (Signed)
Please set up appt with the lung specialist Dr Kendrick Fries

## 2011-05-18 LAB — CBC WITH DIFFERENTIAL/PLATELET
Basophils Relative: 0.6 % (ref 0.0–3.0)
Eosinophils Relative: 1.9 % (ref 0.0–5.0)
HCT: 45.7 % (ref 36.0–46.0)
Hemoglobin: 15.3 g/dL — ABNORMAL HIGH (ref 12.0–15.0)
MCV: 93.6 fl (ref 78.0–100.0)
Monocytes Absolute: 0.6 10*3/uL (ref 0.1–1.0)
Neutro Abs: 5.8 10*3/uL (ref 1.4–7.7)
Neutrophils Relative %: 64.9 % (ref 43.0–77.0)
RBC: 4.89 Mil/uL (ref 3.87–5.11)
WBC: 8.9 10*3/uL (ref 4.5–10.5)

## 2011-05-18 LAB — HEPATIC FUNCTION PANEL
ALT: 19 U/L (ref 0–35)
AST: 21 U/L (ref 0–37)
Total Bilirubin: 0.4 mg/dL (ref 0.3–1.2)

## 2011-05-18 LAB — BASIC METABOLIC PANEL
Chloride: 104 mEq/L (ref 96–112)
Creatinine, Ser: 0.9 mg/dL (ref 0.4–1.2)
Potassium: 3.1 mEq/L — ABNORMAL LOW (ref 3.5–5.1)
Sodium: 140 mEq/L (ref 135–145)

## 2011-05-18 LAB — LIPID PANEL
Cholesterol: 234 mg/dL — ABNORMAL HIGH (ref 0–200)
Total CHOL/HDL Ratio: 6

## 2011-05-18 LAB — LDL CHOLESTEROL, DIRECT: Direct LDL: 158.2 mg/dL

## 2011-05-23 ENCOUNTER — Ambulatory Visit (INDEPENDENT_AMBULATORY_CARE_PROVIDER_SITE_OTHER): Payer: Medicare Other | Admitting: Pulmonary Disease

## 2011-05-23 ENCOUNTER — Encounter: Payer: Self-pay | Admitting: Pulmonary Disease

## 2011-05-23 VITALS — BP 118/76 | HR 77 | Temp 97.8°F | Ht 66.0 in | Wt 152.0 lb

## 2011-05-23 DIAGNOSIS — K219 Gastro-esophageal reflux disease without esophagitis: Secondary | ICD-10-CM

## 2011-05-23 DIAGNOSIS — R05 Cough: Secondary | ICD-10-CM

## 2011-05-23 DIAGNOSIS — R059 Cough, unspecified: Secondary | ICD-10-CM

## 2011-05-23 MED ORDER — MOMETASONE FUROATE 50 MCG/ACT NA SUSP: 2.0000 | Freq: Every day | NASAL | Status: DC

## 2011-05-23 NOTE — Assessment & Plan Note (Addendum)
Meghan Moody chronic cough has features consistent with ongoing acid reflux and untreated post nasal drip.  She think that increasing the prilosec to bid dosing has helped, but she is not following typical lifestyle modifications to treat GERD.  Her post nasal drip may be due to her reflux.  Plan: -Add GERD lifestyle modifications -Start nasal steroid and nasal rinses -add anti-histamine and decongestant -obtain a chest x-ray -if no improvement, consider esophageal impedence study

## 2011-05-23 NOTE — Patient Instructions (Signed)
   Chronic cough, or cough lasting longer than 8 weeks, can be caused by many different conditions, including smoking, asthma, sinus congestion/runny nose, acid reflux or medications.  We believe that your cough is due to post nasal drip and acid reflux  We recommend following:  Use Neil Med rinses with distilled water at least twice per day using the instructions on the package. 1/2 hour after using the [facility] Med rinse, use Nasonex two puffs in each nostril once per day. Use chlortrimeton and an over the counter decongestant (ask the pharmacist for a recommendation) as needed for the cough. Follow the reflux diet instructions we have given you.  Take delsym two tsp every 12 hours. Swallowing water or using ice chips/non mint and menthol containing candies (such as lifesavers or sugarless jolly ranchers) are also effective. You should rest your voice and avoid activities that you know make you cough.   Once you have eliminated the cough for 3 straight days try reducing the delsym as tolerated.   We will see you back in 3 months or sooner if needed.

## 2011-05-23 NOTE — Progress Notes (Signed)
Subjective:    Patient ID: Meghan Moody, female    DOB: Nov 22, 1945, 66 y.o.   MRN: [id number]  HPI 66 y/o female with GERD presents to our clinic for further evaluation of cough.  She states that she has had the cough for 40 years or more and this is the first time she has sought care with a pulmonologist.  She states that she frequently gets the dry cough when she tries to sing or speak on the telephone.  She frequently feels a tickling sensation in her throat and a sensation that she has to clear her throat.  She has some mild dyspnea when she exerts herself, but no chest pain or wheezing.  She states that she was diagnosed with GERD and a hiatal hernia after an endoscopy at [facility].  She has been taking prilosec for years, but recently doubled this to see if it would help with the cough.  She thinks it may have helped some.  She states that she still has some ongoing reflux symptoms and that if she doesn't take the prilosec she has significant reflux symptoms.  She also notes that she has ongoing sinus congestion and post nasal drip which are untreated.  Past Medical History  Diagnosis Date  . Hypertension   . GERD (gastroesophageal reflux disease)   . Hyperlipidemia      Family History  Problem Relation Age of Onset  . Coronary artery disease Mother   . Stroke Father   . Drug abuse Brother      History   Social History  . Marital Status: Widowed    Spouse Name: N/A    Number of Children: 1  . Years of Education: N/A   Occupational History  . 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 Smoking status: Former Smoker -- 0.3 packs/day for 40 years    Types: Cigarettes    Quit date: 04/16/2006  . Smokeless tobacco: Never Used  . Alcohol Use: No  . Drug Use: No  . Sexually Active: Not on file   Other Topics Concern  . Not on file   Social History Narrative   Widowed1 son     No Known Allergies   Outpatient Prescriptions Prior to Visit    Medication Sig Dispense Refill  . benzonatate (TESSALON) 200 MG capsule Take 1 capsule (200 mg total) by mouth 3 (three) times daily as needed for cough.  60 capsule  0  . hydrochlorothiazide 25 MG tablet TAKE 1 TABLET BY MOUTH EVERY DAY  90 tablet  3  . omeprazole (PRILOSEC OTC) 20 MG tablet Take 1 tablet (20 mg total) by mouth 2 (two) times daily.  60 tablet  11  . aspirin 81 MG tablet Take 81 mg by mouth daily.        Marland Kitchen b complex vitamins tablet Take 1 tablet by mouth daily.        . Cholecalciferol (VITAMIN D3) 1000 UNITS CAPS Take by mouth daily.        . fish oil-omega-3 fatty acids 1000 MG capsule Take 2 g by mouth daily.        . Multiple Vitamins-Minerals (CENTRUM SILVER PO) Take by mouth daily.        . vitamin B-12 (CYANOCOBALAMIN) 1000 MCG tablet Take 1,000 mcg by mouth daily.          Review of Systems  Constitutional: Negative for fever, chills and unexpected weight change.  HENT: Positive for  postnasal drip. Negative for ear pain, nosebleeds, congestion, sore throat, rhinorrhea, sneezing, trouble swallowing, dental problem, voice change and sinus pressure.   Eyes: Negative for visual disturbance.  Respiratory: Positive for cough. Negative for choking and shortness of breath.   Cardiovascular: Negative for chest pain and leg swelling.  Gastrointestinal: Negative for vomiting, abdominal pain and diarrhea.  Genitourinary: Negative for difficulty urinating.  Musculoskeletal: Negative for arthralgias.  Skin: Negative for rash.  Neurological: Negative for tremors, syncope and headaches.  Hematological: Does not bruise/bleed easily.       Objective:   Physical Exam  Filed Vitals:   05/23/11 1654  BP: 118/76  Pulse: 77  Temp: 97.8 F (36.6 C)  TempSrc: Oral  Height: 5\' 6"  (1.676 m)  Weight: 152 lb (68.947 kg)  SpO2: 94%    Gen: well appearing, no acute distress HEENT: NCAT, PERRL, EOMi, OP clear, neck supple without masses PULM: cta b, good airmovement CV: RRR,  no mgr, no JVD AB: BS+, soft, nontender, no hsm Ext: warm, no edema, no clubbing, no cyanosis Derm: no rash or skin breakdown Neuro: A&Ox4, CN II-XII intact, strength 5/5 in all 4 extremities       Assessment & Plan:   Cough Ms. Hardcastle's chronic cough has features consistent with ongoing acid reflux and untreated post nasal drip.  She think that increasing the prilosec to bid dosing has helped, but she is not following typical lifestyle modifications to treat GERD.  Her post nasal drip may be due to her reflux.  Plan: -Add GERD lifestyle modifications -Start nasal steroid and nasal rinses -add anti-histamine and decongestant -obtain a chest x-ray -if no improvement, consider esophageal impedence study  GERD Likely contributing to cough.  Have given lifestyle modification recommendations and instructions.  May need an esophageal impedence study if still having cough despite these measures.    Updated Medication List Outpatient Encounter Prescriptions as of 05/23/2011  Medication Sig Dispense Refill  . benzonatate (TESSALON) 200 MG capsule Take 1 capsule (200 mg total) by mouth 3 (three) times daily as needed for cough.  60 capsule  0  . hydrochlorothiazide 25 MG tablet TAKE 1 TABLET BY MOUTH EVERY DAY  90 tablet  3  . omeprazole (PRILOSEC OTC) 20 MG tablet Take 1 tablet (20 mg total) by mouth 2 (two) times daily.  60 tablet  11  . mometasone (NASONEX) 50 MCG/ACT nasal spray Place 2 sprays into the nose daily.  17 g  2  . DISCONTD: aspirin 81 MG tablet Take 81 mg by mouth daily.        Marland Kitchen DISCONTD: b complex vitamins tablet Take 1 tablet by mouth daily.        Marland Kitchen DISCONTD: Cholecalciferol (VITAMIN D3) 1000 UNITS CAPS Take by mouth daily.        Marland Kitchen DISCONTD: fish oil-omega-3 fatty acids 1000 MG capsule Take 2 g by mouth daily.        Marland Kitchen DISCONTD: Multiple Vitamins-Minerals (CENTRUM SILVER PO) Take by mouth daily.        Marland Kitchen DISCONTD: vitamin B-12 (CYANOCOBALAMIN) 1000 MCG tablet Take 1,000  mcg by mouth daily.

## 2011-05-23 NOTE — Assessment & Plan Note (Signed)
Likely contributing to cough.  Have given lifestyle modification recommendations and instructions.  May need an esophageal impedence study if still having cough despite these measures.

## 2011-05-24 ENCOUNTER — Other Ambulatory Visit: Payer: Self-pay | Admitting: Pulmonary Disease

## 2011-05-24 ENCOUNTER — Ambulatory Visit (INDEPENDENT_AMBULATORY_CARE_PROVIDER_SITE_OTHER)
Admission: RE | Admit: 2011-05-24 | Discharge: 2011-05-24 | Disposition: A | Discharge status: Home / Self Care | Payer: Medicare Other | Source: Ambulatory Visit | Attending: Pulmonary Disease | Admitting: Pulmonary Disease

## 2011-05-24 ENCOUNTER — Encounter: Payer: Self-pay | Admitting: *Deleted

## 2011-05-24 DIAGNOSIS — R059 Cough, unspecified: Secondary | ICD-10-CM

## 2011-05-24 DIAGNOSIS — R05 Cough: Secondary | ICD-10-CM

## 2011-05-24 MED ORDER — TRIAMTERENE-HCTZ 37.5-25 MG PO TABS: 1.0000 | ORAL_TABLET | Freq: Every day | ORAL | Status: DC

## 2011-05-25 ENCOUNTER — Telehealth: Payer: Self-pay | Admitting: *Deleted

## 2011-05-25 NOTE — Telephone Encounter (Signed)
Message copied by Christen Butter on Fri May 25, 2011  9:34 AM ------      Message from: Veto Kemps B      Created: Thu May 24, 2011  7:01 PM       L,            Can you let her know that her CXR was normal?            Thanks,      Kipp Brood

## 2011-05-25 NOTE — Telephone Encounter (Signed)
Spoke with pt and notified of results per Dr. Kendrick Fries. Pt verbalized understanding.

## 2011-06-01 ENCOUNTER — Other Ambulatory Visit: Payer: Medicare Other

## 2011-06-01 DIAGNOSIS — Z1211 Encounter for screening for malignant neoplasm of colon: Secondary | ICD-10-CM

## 2011-06-06 ENCOUNTER — Encounter: Payer: Self-pay | Admitting: *Deleted

## 2011-06-20 ENCOUNTER — Other Ambulatory Visit: Payer: Self-pay | Admitting: Internal Medicine

## 2011-06-20 DIAGNOSIS — E876 Hypokalemia: Secondary | ICD-10-CM

## 2011-06-22 ENCOUNTER — Other Ambulatory Visit (INDEPENDENT_AMBULATORY_CARE_PROVIDER_SITE_OTHER): Payer: Medicare Other

## 2011-06-22 DIAGNOSIS — E876 Hypokalemia: Secondary | ICD-10-CM

## 2011-06-22 LAB — BASIC METABOLIC PANEL
BUN: 29 mg/dL — ABNORMAL HIGH (ref 6–23)
Calcium: 9.5 mg/dL (ref 8.4–10.5)
Creatinine, Ser: 1.1 mg/dL (ref 0.4–1.2)
GFR: 52.73 mL/min — ABNORMAL LOW (ref >60.00)

## 2011-07-04 ENCOUNTER — Other Ambulatory Visit: Payer: Self-pay | Admitting: *Deleted

## 2011-07-04 MED ORDER — POTASSIUM CHLORIDE CRYS ER 20 MEQ PO TBCR: 20.0000 meq | EXTENDED_RELEASE_TABLET | Freq: Every day | ORAL | Status: DC

## 2011-07-04 NOTE — Telephone Encounter (Signed)
Advised pt of lab results, follow up lab appt scheduled, medicine sent to pharmacy.

## 2011-07-23 ENCOUNTER — Other Ambulatory Visit: Payer: Self-pay | Admitting: Internal Medicine

## 2011-07-23 DIAGNOSIS — E876 Hypokalemia: Secondary | ICD-10-CM

## 2011-07-24 ENCOUNTER — Other Ambulatory Visit (INDEPENDENT_AMBULATORY_CARE_PROVIDER_SITE_OTHER): Payer: Medicare Other

## 2011-07-24 DIAGNOSIS — E876 Hypokalemia: Secondary | ICD-10-CM

## 2011-07-24 LAB — POTASSIUM: Potassium: 4.1 mEq/L (ref 3.5–5.1)

## 2011-07-25 ENCOUNTER — Encounter: Payer: Self-pay | Admitting: *Deleted

## 2011-09-27 ENCOUNTER — Ambulatory Visit (INDEPENDENT_AMBULATORY_CARE_PROVIDER_SITE_OTHER): Payer: Medicare Other | Admitting: Pulmonary Disease

## 2011-09-27 ENCOUNTER — Encounter: Payer: Self-pay | Admitting: Pulmonary Disease

## 2011-09-27 VITALS — BP 124/78 | HR 79 | Temp 97.9°F | Ht 65.5 in | Wt 151.1 lb

## 2011-09-27 DIAGNOSIS — R059 Cough, unspecified: Secondary | ICD-10-CM

## 2011-09-27 DIAGNOSIS — R05 Cough: Secondary | ICD-10-CM

## 2011-09-27 MED ORDER — MOMETASONE FUROATE 50 MCG/ACT NA SUSP: 2.0000 | Freq: Every day | NASAL | Status: AC

## 2011-09-27 NOTE — Progress Notes (Signed)
  Subjective:    Patient ID: Meghan Moody, female    DOB: March 22, 1946, 66 y.o.   MRN: [id number]  HPI  09/27/11 ROV -- f/u cough -- Meghan Moody is a pleasant woman who is following up with [place] for cough.  She is doing much better and her cough has gone away.  She is taking pseudoephedrine and chlorpheniramine twice a day.  She never really used the nasonex and she did not try the saline rinses.  She is following the GERD diet and continues to take prilosec.  Review of Systems  not performed    Objective:   Physical Exam  Filed Vitals:   09/27/11 1609  BP: 124/78  Pulse: 79  Temp: 97.9 F (36.6 C)  TempSrc: Oral  Height: 5' 5.5" (1.664 m)  Weight: 151 lb 1.9 oz (68.548 kg)  SpO2: 94%    Gen: well appearing, no acute distress HEENT: NCAT, PERRL, EOMi, OP clear, PULM: CTA B CV: RRR, no mgr, no JVD AB: non distended Ext: warm, no edema, no clubbing, no cyanosis       Assessment & Plan:   Cough Ms. Stumph has responded well to decongestants and following the GERD diet.  I advised that she could transition from the chlorpheniramine and pseudoephedrine combination to nasonex. She is taking the pseudoephedrine twice a day continuously.  I advised that it would be better to use this only when the cough is bothersome and not every day due to side effects.    Nasonex Rx sent.  She will follow up with [place] as needed.   Updated Medication List Outpatient Encounter Prescriptions as of 09/27/2011  Medication Sig Dispense Refill  . chlorpheniramine (CHLOR-TRIMETON) 4 MG tablet Take 4 mg by mouth 2 (two) times daily.      Marland Kitchen omeprazole (PRILOSEC OTC) 20 MG tablet Take 1 tablet (20 mg total) by mouth 2 (two) times daily.  60 tablet  11  . potassium chloride SA (K-DUR,KLOR-CON) 20 MEQ tablet Take 1 tablet (20 mEq total) by mouth daily.  30 tablet  11  . pseudoephedrine (SUDAFED) 30 MG tablet Take 30 mg by mouth 2 (two) times daily.      Marland Kitchen triamterene-hydrochlorothiazide (MAXZIDE-25) 37.5-25  MG per tablet Take 1 each (1 tablet total) by mouth daily.  90 tablet  3  . benzonatate (TESSALON) 200 MG capsule Take 1 capsule (200 mg total) by mouth 3 (three) times daily as needed for cough.  60 capsule  0  . mometasone (NASONEX) 50 MCG/ACT nasal spray Place 2 sprays into the nose daily.  17 g  2  . DISCONTD: mometasone (NASONEX) 50 MCG/ACT nasal spray Place 2 sprays into the nose daily.  17 g  2

## 2011-09-27 NOTE — Assessment & Plan Note (Signed)
Meghan Moody has responded well to decongestants and following the GERD diet.  I advised that she could transition from the chlorpheniramine and pseudoephedrine combination to nasonex. She is taking the pseudoephedrine twice a day continuously.  I advised that it would be better to use this only when the cough is bothersome and not every day due to side effects.    Nasonex Rx sent.  She will follow up with [place] as needed.

## 2011-09-27 NOTE — Patient Instructions (Signed)
Start using nasonex two sprays into each nostril daily After a week, you can stop the chlorpheniramine and the pseudoephedrine We will see you back as needed for cough.

## 2011-11-16 ENCOUNTER — Telehealth: Payer: Self-pay | Admitting: Internal Medicine

## 2011-11-16 ENCOUNTER — Ambulatory Visit (INDEPENDENT_AMBULATORY_CARE_PROVIDER_SITE_OTHER): Payer: Medicare Other | Admitting: Internal Medicine

## 2011-11-16 ENCOUNTER — Encounter: Payer: Self-pay | Admitting: Internal Medicine

## 2011-11-16 ENCOUNTER — Telehealth: Payer: Self-pay

## 2011-11-16 VITALS — BP 140/86 | HR 72 | Temp 97.8°F | Ht 65.5 in | Wt 151.5 lb

## 2011-11-16 DIAGNOSIS — R079 Chest pain, unspecified: Secondary | ICD-10-CM | POA: Insufficient documentation

## 2011-11-16 MED ORDER — OMEPRAZOLE MAGNESIUM 20 MG PO TBEC: 20.0000 mg | DELAYED_RELEASE_TABLET | Freq: Two times a day (BID) | ORAL | Status: DC

## 2011-11-16 MED ORDER — POTASSIUM CHLORIDE CRYS ER 20 MEQ PO TBCR: 20.0000 meq | EXTENDED_RELEASE_TABLET | Freq: Every day | ORAL | Status: DC

## 2011-11-16 MED ORDER — TRIAMTERENE-HCTZ 37.5-25 MG PO TABS: 1.0000 | ORAL_TABLET | Freq: Every day | ORAL | Status: DC

## 2011-11-16 NOTE — Telephone Encounter (Signed)
Called patient but was unable to reach her, spoke to [facility] and he advised that she go to the ER.

## 2011-11-16 NOTE — Telephone Encounter (Signed)
Triage Record Num: [id number] Operator: Durward Mallard DiMatteis Patient Name: Meghan Moody Call Date & Time: 11/16/2011 1:15:32PM Patient Phone: [phone] PCP: Tillman Abide Patient Gender: Female PCP Fax : [phone] Patient DOB: 10/26/1945 Practice Name: [facility] Reason for Call: Addendum to previous triage from 11/16/11 at 12:14pm: RN called pt back and instructed that Dr Alphonsus Sias strongly encourages her to go to ER; pt continues to refuse; she states tht this is stress; instructed that if she is refusing ER that she needs to be seen in office now; pt requests to talk to Dr Alphonsus Sias; instructed that he is with patients now; states that she will come to the office now; suggested another adult to drive; states that she will drive herself but her mom will be with her; she states she is 20 min from office but will come now; office called and spoke to [facility] and was made aware that patient continues to refuse ER and on her way to office now; RN got the impression that pt would have waited until appt at 4:00pm today if RN didn't call office to see what DR Alphonsus Sias suggested even though RN suggested many times for pt to go to ER now Protocol(s) Used: Office Note Recommended Outcome per Protocol: Information Noted and Sent to Office Reason for Outcome: Caller information to office Care Advice: ~

## 2011-11-16 NOTE — Telephone Encounter (Signed)
See OV note.  

## 2011-11-16 NOTE — Telephone Encounter (Signed)
Addendum to previous triage from 11/16/11 at 12:14pm: RN called pt back and instructed that Dr Alphonsus Sias strongly encourages her to go to ER; pt continues to refuse; she states tht this is stress; instructed that if she is refusing ER that she needs to be seen in office now; pt requests to talk to Dr Alphonsus Sias; instructed that he is with patients now; states that she will come to the office now; suggested another adult to drive; states that she will drive herself but her mom will be with her; she states she is 20 min from office but will come now; office called and spoke to [facility] and was made aware that patient continues to refuse ER and on her way to office now; RN got the impression that pt would have waited until appt at 4:00pm today if RN didn't call office to see what DR Alphonsus Sias suggested even though RN suggested many times for pt to go to ER now

## 2011-11-16 NOTE — Telephone Encounter (Signed)
She apparently didn't want to go to ER Trying to get her in here immediately---with instructions that she shouldn't be driving

## 2011-11-16 NOTE — Telephone Encounter (Signed)
Caller: Meghan Moody/Patient; PCP: Tillman Abide; CB#: [phone];  Call regarding Chest Pain/Chest Discomfort; pain is located on the outer right  breast area and radiated to the back area; rt shoulder hurts; denies SOB; hurts with movement of the rt arm; hurts mostly when laying down;  sx started 09/2011; pain comes and goes;   went away and started again 11/13/11; pain is present now; rates 3/10; Triaged per Chest Pain Guideline; Call 911 d/t chest pain now; pt is wanting to come to the office after trying to convince her to go to ER;  states that she had this in 2009; pt already has an appt at 4:00pm today; she feels that it is stress and she can not afford ER visit; office called and spoke to Dr Karle Starch nurse; she will talk to MD and call pt back at the above number; pt will comply

## 2011-11-16 NOTE — Telephone Encounter (Signed)
Confirmed with the manager at [facility] that the patient be advised to call 911 or come into the office immediately.  [facility] called back and had notified the patient to call 911 or come into the office immediately and that the patient said she was leaving at that moment to come into the office immediately.

## 2011-11-16 NOTE — Assessment & Plan Note (Signed)
History and exam clearly consistent with costochondritis She feels her stress may be exacerbating things Also doing a lot of [place] to [place] things out EKG is benign  Okay to try ice/heat Ibuprofen prn

## 2011-11-16 NOTE — Progress Notes (Signed)
Subjective:    Patient ID: Meghan Moody, female    DOB: [birth year], 66 y.o.   MRN: [id number]  HPI Has had intermittent chest pain for about 1 month Thinks it may have started after moving a mattress to her mom's house Noticed again when she turned over this AM so finally called for appt Pain along right sternal border ~T8-9  Has tried ibuprofen 800mg  if it is really hurting This does help  No SOB No edema No regular exercise but no change in stamina  Current Outpatient Prescriptions on File Prior to Visit  Medication Sig Dispense Refill  . benzonatate (TESSALON) 200 MG capsule Take 1 capsule (200 mg total) by mouth 3 (three) times daily as needed for cough.  60 capsule  0  . chlorpheniramine (CHLOR-TRIMETON) 4 MG tablet Take 4 mg by mouth 2 (two) times daily.      . mometasone (NASONEX) 50 MCG/ACT nasal spray Place 2 sprays into the nose daily.  17 g  2  . omeprazole (PRILOSEC OTC) 20 MG tablet Take 1 tablet (20 mg total) by mouth 2 (two) times daily.  60 tablet  11  . potassium chloride SA (K-DUR,KLOR-CON) 20 MEQ tablet Take 1 tablet (20 mEq total) by mouth daily.  30 tablet  11  . pseudoephedrine (SUDAFED) 30 MG tablet Take 30 mg by mouth 2 (two) times daily.      Marland Kitchen triamterene-hydrochlorothiazide (MAXZIDE-25) 37.5-25 MG per tablet Take 1 each (1 tablet total) by mouth daily.  90 tablet  3    No Known Allergies  Past Medical History  Diagnosis Date  . Hypertension   . GERD (gastroesophageal reflux disease)   . Hyperlipidemia     Past Surgical History  Procedure Date  . Tubal ligation   . Bunionectomy     Family History  Problem Relation Age of Onset  . Coronary artery disease Mother   . Stroke Father   . Drug abuse Brother     History   Social History  . Marital Status: Widowed    Spouse Name: N/A    Number of Children: 1  . Years of Education: N/A   Occupational History  . 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 Smoking  status: Former Smoker -- 0.3 packs/day for 40 years    Types: Cigarettes    Quit date: 04/16/2006  . Smokeless tobacco: Never Used  . Alcohol Use: No  . Drug Use: No  . Sexually Active: Not on file   Other Topics Concern  . Not on file   Social History Narrative   Widowed1 son   Review of Systems Now off pseudoephedrine from pulmonologist. Diagnosed as reflux Post nasal drip and cough are worsening now On prilosec and no reflux symptoms    Objective:   Physical Exam  Constitutional: She appears well-developed and well-nourished. No distress.  Neck: Normal range of motion. Neck supple. No thyromegaly present.  Cardiovascular: Normal rate, regular rhythm and normal heart sounds.  Exam reveals no gallop.   No murmur heard. Pulmonary/Chest: Effort normal and breath sounds normal. No respiratory distress. She has no wheezes. She has no rales. She exhibits tenderness.       Isolated tenderness along right costal cartilage ~T6  Abdominal: Soft. There is no tenderness.  Musculoskeletal: She exhibits no edema and no tenderness.  Lymphadenopathy:    She has no cervical adenopathy.  Psychiatric: She has a normal mood and affect. Her behavior is  normal.          Assessment & Plan:

## 2011-11-16 NOTE — Telephone Encounter (Signed)
Patient came in the office to visit.

## 2012-04-16 LAB — HM DEXA SCAN

## 2012-05-19 ENCOUNTER — Encounter: Payer: Medicare Other | Admitting: Internal Medicine

## 2012-06-12 ENCOUNTER — Other Ambulatory Visit: Payer: Self-pay | Admitting: Internal Medicine

## 2012-08-20 ENCOUNTER — Other Ambulatory Visit: Payer: Self-pay | Admitting: Internal Medicine

## 2012-09-22 ENCOUNTER — Ambulatory Visit: Payer: Self-pay | Admitting: Obstetrics and Gynecology

## 2012-11-10 ENCOUNTER — Encounter: Payer: Self-pay | Admitting: Internal Medicine

## 2012-11-10 ENCOUNTER — Ambulatory Visit (INDEPENDENT_AMBULATORY_CARE_PROVIDER_SITE_OTHER): Payer: Medicare Other | Admitting: Internal Medicine

## 2012-11-10 VITALS — BP 120/70 | HR 69 | Temp 98.4°F | Ht 65.5 in | Wt 148.0 lb

## 2012-11-10 DIAGNOSIS — Z0000 Encounter for general adult medical examination without abnormal findings: Secondary | ICD-10-CM

## 2012-11-10 DIAGNOSIS — Z23 Encounter for immunization: Secondary | ICD-10-CM

## 2012-11-10 DIAGNOSIS — M436 Torticollis: Secondary | ICD-10-CM

## 2012-11-10 DIAGNOSIS — Z1211 Encounter for screening for malignant neoplasm of colon: Secondary | ICD-10-CM

## 2012-11-10 DIAGNOSIS — I10 Essential (primary) hypertension: Secondary | ICD-10-CM

## 2012-11-10 DIAGNOSIS — K219 Gastro-esophageal reflux disease without esophagitis: Secondary | ICD-10-CM

## 2012-11-10 DIAGNOSIS — E785 Hyperlipidemia, unspecified: Secondary | ICD-10-CM

## 2012-11-10 LAB — BASIC METABOLIC PANEL
BUN: 24 mg/dL — ABNORMAL HIGH (ref 6–23)
Calcium: 9.5 mg/dL (ref 8.4–10.5)
Chloride: 104 mEq/L (ref 96–112)
Creatinine, Ser: 1.1 mg/dL (ref 0.4–1.2)

## 2012-11-10 LAB — LIPID PANEL
Cholesterol: 242 mg/dL — ABNORMAL HIGH (ref 0–200)
HDL: 39.8 mg/dL (ref >39.00)
Triglycerides: 178 mg/dL — ABNORMAL HIGH (ref 0.0–149.0)

## 2012-11-10 LAB — HEPATIC FUNCTION PANEL
Bilirubin, Direct: 0 mg/dL (ref 0.0–0.3)
Total Bilirubin: 0.6 mg/dL (ref 0.3–1.2)

## 2012-11-10 LAB — CBC WITH DIFFERENTIAL/PLATELET
Eosinophils Absolute: 0.2 10*3/uL (ref 0.0–0.7)
Eosinophils Relative: 1.8 % (ref 0.0–5.0)
Lymphocytes Relative: 26.7 % (ref 12.0–46.0)
MCHC: 33.1 g/dL (ref 30.0–36.0)
MCV: 91.4 fl (ref 78.0–100.0)
Monocytes Absolute: 0.6 10*3/uL (ref 0.1–1.0)
Neutrophils Relative %: 63.9 % (ref 43.0–77.0)
Platelets: 282 10*3/uL (ref 150.0–400.0)
WBC: 8.8 10*3/uL (ref 4.5–10.5)

## 2012-11-10 LAB — MAGNESIUM: Magnesium: 1.9 mg/dL (ref 1.5–2.5)

## 2012-11-10 LAB — LDL CHOLESTEROL, DIRECT: Direct LDL: 163.1 mg/dL

## 2012-11-10 LAB — TSH: TSH: 1.15 u[IU]/mL (ref 0.35–5.50)

## 2012-11-10 NOTE — Progress Notes (Signed)
Subjective:    Patient ID: Meghan Moody, female    DOB: Dec 08, 1945, 67 y.o.   MRN: [id number]  HPI Here for physical  Sees Dr Toya Smothers Just had pap smear--discussed that these are really not needed Just had mammogram this year  Has had some decreased ROM in neck Has tried changing pillows Having trouble checking blind spot in car No known injury Has tried ben gay---some help  Concerned about her leg veins No swelling or pain They are all superficial--discussed cosmetic  Current Outpatient Prescriptions on File Prior to Visit  Medication Sig Dispense Refill  . KLOR-CON M20 20 MEQ tablet TAKE 1 TABLET (20 MEQ TOTAL) BY MOUTH DAILY.  30 tablet  1  . PRILOSEC OTC 20 MG tablet TAKE 1 TABLET BY MOUTH TWICE A DAY  60 tablet  1  . triamterene-hydrochlorothiazide (MAXZIDE-25) 37.5-25 MG per tablet TAKE 1 EACH (1 TABLET TOTAL) BY MOUTH DAILY.  90 tablet  3   No current facility-administered medications on file prior to visit.    No Known Allergies  Past Medical History  Diagnosis Date  . Hypertension   . GERD (gastroesophageal reflux disease)   . Hyperlipidemia     Past Surgical History  Procedure Laterality Date  . Tubal ligation    . Bunionectomy      Family History  Problem Relation Age of Onset  . Coronary artery disease Mother   . Stroke Father   . Drug abuse Brother     History   Social History  . Marital Status: Widowed    Spouse Name: N/A    Number of Children: 1  . Years of Education: N/A   Occupational History  . 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 Smoking status: Former Smoker -- 0.30 packs/day for 40 years    Types: Cigarettes    Quit date: 04/16/2006  . Smokeless tobacco: Never Used  . Alcohol Use: No  . Drug Use: No  . Sexually Active: Not on file   Other Topics Concern  . Not on file   Social History Narrative   Widowed, 1 son   New relationship since 2013      No living will   Not sure about  health care POA   Would accept resuscitation attempts but no prolonged ventilation.   Would accept tube feeds   Review of Systems  Constitutional: Negative for fatigue and unexpected weight change.       Walks the dog regularly--- over 1 hour daily Wears seat belt  HENT: Positive for hearing loss, rhinorrhea, postnasal drip and tinnitus.        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       No diplopia or unilateral vision loss Saw Dr Fransico Michael for floaters Early cataracts  Respiratory: Positive for cough. Negative for chest tightness and shortness of breath.        Cough is from the allergies  Cardiovascular: Negative for chest pain, palpitations and leg swelling.  Gastrointestinal: Negative for nausea, vomiting, abdominal pain, constipation and blood in stool.       Heartburn controlled by prilosec  Endocrine: Negative for cold intolerance and heat intolerance.  Genitourinary: Positive for urgency. Negative for dysuria and dyspareunia.       Mild trouble with mixed incontinence--hasn't needed a pad  Musculoskeletal: Positive for myalgias and arthralgias. Negative for back  pain and joint swelling.       Right 2nd and 3 rd fingers---some nodules and stiffness Neck  Gets cramps at times in thighs and calves. Occasional in arms  Skin: Negative for rash.       No suspicious lesions  Allergic/Immunologic: Positive for environmental allergies. Negative for immunocompromised state.  Neurological: Negative for dizziness, syncope, weakness, light-headedness, numbness and headaches.  Hematological: Negative for adenopathy. Bruises/bleeds easily.  Psychiatric/Behavioral: Negative for sleep disturbance and dysphoric mood. The patient is not nervous/anxious.        Objective:   Physical Exam  Constitutional: She is oriented to person, place, and time. She appears well-developed and well-nourished. No  distress.  HENT:  Head: Normocephalic and atraumatic.  Right Ear: External ear normal.  Left Ear: External ear normal.  Mouth/Throat: Oropharynx is clear and moist. No oropharyngeal exudate.  Eyes: Conjunctivae and EOM are normal. Pupils are equal, round, and reactive to light.  Neck: No thyromegaly present.  Very little tilt Decreased flexion and extension and exp rotation to the left  Cardiovascular: Normal rate, regular rhythm, normal heart sounds and intact distal pulses.  Exam reveals no gallop.   No murmur heard. Pulmonary/Chest: Effort normal and breath sounds normal. No respiratory distress. She has no wheezes. She has no rales.  Abdominal: Soft. There is no tenderness.  Musculoskeletal: She exhibits no edema and no tenderness.  Lymphadenopathy:    She has no cervical adenopathy.  Neurological: She is alert and oriented to person, place, and time.  Skin: No rash noted. No erythema.  Scattered benign nevi  Psychiatric: She has a normal mood and affect. Her behavior is normal.          Assessment & Plan:

## 2012-11-10 NOTE — Assessment & Plan Note (Signed)
Will refer for PT.   

## 2012-11-10 NOTE — Assessment & Plan Note (Signed)
Discussed primary prevention---no meds for now

## 2012-11-10 NOTE — Assessment & Plan Note (Signed)
Quiet on the med 

## 2012-11-10 NOTE — Assessment & Plan Note (Signed)
BP Readings from Last 3 Encounters:  11/10/12 120/70  11/16/11 140/86  09/27/11 124/78   Good control

## 2012-11-10 NOTE — Assessment & Plan Note (Signed)
Healthy UTD other than doing fecal immunoassay again Some night cramps--discussed Rx and will check labs

## 2012-11-11 ENCOUNTER — Encounter: Payer: Self-pay | Admitting: *Deleted

## 2012-11-11 ENCOUNTER — Other Ambulatory Visit: Payer: Self-pay | Admitting: Internal Medicine

## 2013-01-02 ENCOUNTER — Other Ambulatory Visit: Payer: Self-pay | Admitting: Internal Medicine

## 2013-02-19 ENCOUNTER — Other Ambulatory Visit: Payer: Self-pay

## 2013-05-02 ENCOUNTER — Other Ambulatory Visit: Payer: Self-pay | Admitting: Internal Medicine

## 2013-07-02 ENCOUNTER — Other Ambulatory Visit: Payer: Self-pay | Admitting: Internal Medicine

## 2013-12-31 ENCOUNTER — Other Ambulatory Visit: Payer: Self-pay | Admitting: Internal Medicine

## 2014-01-29 ENCOUNTER — Other Ambulatory Visit: Payer: Self-pay

## 2014-01-29 ENCOUNTER — Ambulatory Visit: Payer: Self-pay | Admitting: Obstetrics and Gynecology

## 2014-03-02 ENCOUNTER — Other Ambulatory Visit: Payer: Self-pay

## 2014-03-02 MED ORDER — CARISOPRODOL 350 MG PO TABS: 350.0000 mg | ORAL_TABLET | Freq: Three times a day (TID) | ORAL | Status: AC

## 2014-03-02 MED ORDER — IBUPROFEN 800 MG PO TABS: 800.0000 mg | ORAL_TABLET | Freq: Three times a day (TID) | ORAL | Status: AC | PRN

## 2014-03-02 NOTE — Telephone Encounter (Signed)
Approved: ibuprofen #100 x 0 Carisoprodol #30 x 0   1 tid prn

## 2014-03-02 NOTE — Telephone Encounter (Signed)
When pt bends a lot ie; vacuuming or gardening pt has on and off lower back pain. Pt gardened 03/01/14 and had lower back pain located on the spine. Pt took generic soma and ibuprofen and pt states she is OK today but pt is out of med (has not had filled since 2012) and pt request [facility]rod[facility] Please advise. Pt last seen 11/10/12.

## 2014-03-02 NOTE — Telephone Encounter (Signed)
rx sent to pharmacy by e-script  

## 2014-06-26 ENCOUNTER — Other Ambulatory Visit: Payer: Self-pay | Admitting: Internal Medicine

## 2014-07-07 ENCOUNTER — Other Ambulatory Visit: Payer: Self-pay | Admitting: Internal Medicine

## 2014-07-15 LAB — HM MAMMOGRAPHY: HM Mammogram: NORMAL (ref 0–4)

## 2014-09-21 ENCOUNTER — Other Ambulatory Visit: Payer: Self-pay | Admitting: Internal Medicine

## 2014-09-23 ENCOUNTER — Telehealth: Payer: Self-pay | Admitting: Internal Medicine

## 2014-09-23 MED ORDER — TRIAMTERENE-HCTZ 37.5-25 MG PO TABS: 1.0000 | ORAL_TABLET | Freq: Every day | ORAL | Status: DC

## 2014-09-23 NOTE — Telephone Encounter (Signed)
She said her drug store call and said a rx was denied that she needed to make an appointment   Didn't say which rx

## 2014-09-23 NOTE — Telephone Encounter (Signed)
rx sent to pharmacy by e-script  

## 2014-09-23 NOTE — Telephone Encounter (Signed)
Pt made a med refill appointment for 7/1

## 2014-09-23 NOTE — Telephone Encounter (Signed)
Does she need a refill on something?

## 2014-10-11 ENCOUNTER — Other Ambulatory Visit: Payer: Self-pay

## 2014-10-15 ENCOUNTER — Ambulatory Visit (INDEPENDENT_AMBULATORY_CARE_PROVIDER_SITE_OTHER): Payer: Medicare Other | Admitting: Internal Medicine

## 2014-10-15 ENCOUNTER — Encounter: Payer: Self-pay | Admitting: Internal Medicine

## 2014-10-15 VITALS — BP 120/78 | HR 70 | Temp 97.7°F | Ht 66.0 in | Wt 147.0 lb

## 2014-10-15 DIAGNOSIS — F39 Unspecified mood [affective] disorder: Secondary | ICD-10-CM | POA: Insufficient documentation

## 2014-10-15 DIAGNOSIS — Z658 Other specified problems related to psychosocial circumstances: Secondary | ICD-10-CM | POA: Diagnosis not present

## 2014-10-15 DIAGNOSIS — E785 Hyperlipidemia, unspecified: Secondary | ICD-10-CM | POA: Diagnosis not present

## 2014-10-15 DIAGNOSIS — K219 Gastro-esophageal reflux disease without esophagitis: Secondary | ICD-10-CM

## 2014-10-15 DIAGNOSIS — I10 Essential (primary) hypertension: Secondary | ICD-10-CM | POA: Diagnosis not present

## 2014-10-15 DIAGNOSIS — F439 Reaction to severe stress, unspecified: Secondary | ICD-10-CM

## 2014-10-15 LAB — LIPID PANEL
CHOLESTEROL: 219 mg/dL — AB (ref 0–200)
HDL: 35.7 mg/dL — AB (ref >39.00)
LDL Cholesterol: 159 mg/dL — ABNORMAL HIGH (ref 0–99)
NONHDL: 183.3
Total CHOL/HDL Ratio: 6
Triglycerides: 120 mg/dL (ref 0.0–149.0)
VLDL: 24 mg/dL (ref 0.0–40.0)

## 2014-10-15 LAB — COMPREHENSIVE METABOLIC PANEL
ALT: 16 U/L (ref 0–35)
AST: 18 U/L (ref 0–37)
Albumin: 4.1 g/dL (ref 3.5–5.2)
Alkaline Phosphatase: 97 U/L (ref 39–117)
BILIRUBIN TOTAL: 0.5 mg/dL (ref 0.2–1.2)
BUN: 29 mg/dL — ABNORMAL HIGH (ref 6–23)
CALCIUM: 9.4 mg/dL (ref 8.4–10.5)
CO2: 29 mEq/L (ref 19–32)
Chloride: 102 mEq/L (ref 96–112)
Creatinine, Ser: 1.15 mg/dL (ref 0.40–1.20)
GFR: 49.6 mL/min — AB (ref >60.00)
Glucose, Bld: 103 mg/dL — ABNORMAL HIGH (ref 70–99)
Potassium: 2.9 mEq/L — ABNORMAL LOW (ref 3.5–5.1)
Sodium: 141 mEq/L (ref 135–145)
Total Protein: 7 g/dL (ref 6.0–8.3)

## 2014-10-15 LAB — CBC WITH DIFFERENTIAL/PLATELET
BASOS PCT: 0.7 % (ref 0.0–3.0)
Basophils Absolute: 0.1 10*3/uL (ref 0.0–0.1)
EOS PCT: 2 % (ref 0.0–5.0)
Eosinophils Absolute: 0.2 10*3/uL (ref 0.0–0.7)
HCT: 39.7 % (ref 36.0–46.0)
Hemoglobin: 12.7 g/dL (ref 12.0–15.0)
Lymphocytes Relative: 18.9 % (ref 12.0–46.0)
Lymphs Abs: 1.7 10*3/uL (ref 0.7–4.0)
MCHC: 31.9 g/dL (ref 30.0–36.0)
MCV: 82.2 fl (ref 78.0–100.0)
Monocytes Absolute: 0.6 10*3/uL (ref 0.1–1.0)
Monocytes Relative: 6.8 % (ref 3.0–12.0)
Neutro Abs: 6.6 10*3/uL (ref 1.4–7.7)
Neutrophils Relative %: 71.6 % (ref 43.0–77.0)
Platelets: 375 10*3/uL (ref 150.0–400.0)
RBC: 4.83 Mil/uL (ref 3.87–5.11)
RDW: 16 % — AB (ref 11.5–15.5)
WBC: 9.2 10*3/uL (ref 4.0–10.5)

## 2014-10-15 LAB — T4, FREE: Free T4: 0.86 ng/dL (ref 0.60–1.60)

## 2014-10-15 MED ORDER — TRIAMTERENE-HCTZ 37.5-25 MG PO TABS: 1.0000 | ORAL_TABLET | Freq: Every day | ORAL | Status: DC

## 2014-10-15 MED ORDER — POTASSIUM CHLORIDE ER 10 MEQ PO TBCR: 10.0000 meq | EXTENDED_RELEASE_TABLET | Freq: Every day | ORAL | Status: DC

## 2014-10-15 NOTE — Assessment & Plan Note (Signed)
BP Readings from Last 3 Encounters:  10/15/14 120/78  11/10/12 120/70  11/16/11 140/86   Good control Due for labs

## 2014-10-15 NOTE — Assessment & Plan Note (Signed)
Discussed brother's situation

## 2014-10-15 NOTE — Progress Notes (Signed)
Subjective:    Patient ID: Meghan DemarkJean B Montefusco, female    DOB: July 28, 1945, 69 y.o.   MRN: [id number]  HPI Here for follow up and med refills  Brother with massive stroke-- now in hospice home dying Mom had been in hospital and now in rehab Very difficult family stressors  Hasn't been in for a while Keeps up with gyn--Dr Knowles-Jonas BP has okay No headaches No chest pain or SOB Not much exercise Still running business No edema No dizziness or syncope  Remains on PPI daily Controls heartburn and keeps her from coughing No swallowing problems Also on antihistamine/decongestant --for chronic PND  Discussed cholesterol Prefers no meds  Current Outpatient Prescriptions on File Prior to Visit  Medication Sig Dispense Refill  . aspirin 81 MG tablet Take 81 mg by mouth daily.    Marland Kitchen. KLOR-CON M20 20 MEQ tablet TAKE 1 TABLET BY MOUTH DAILY 90 tablet 2  . PRILOSEC OTC 20 MG tablet TAKE 1 TABLET BY MOUTH TWICE A DAY 56 tablet 3  . triamterene-hydrochlorothiazide (MAXZIDE-25) 37.5-25 MG per tablet Take 1 tablet by mouth daily. 30 tablet 0   No current facility-administered medications on file prior to visit.    No Known Allergies  Past Medical History  Diagnosis Date  . Hypertension   . GERD (gastroesophageal reflux disease)   . Hyperlipidemia     Past Surgical History  Procedure Laterality Date  . Tubal ligation    . Bunionectomy      Family History  Problem Relation Age of Onset  . Coronary artery disease Mother   . Stroke Father   . Drug abuse Brother     History   Social History  . Marital Status: Married    Spouse Name: N/A  . Number of Children: 1  . Years of Education: N/A   Occupational History  . 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 Smoking status: Former Smoker -- 0.30 packs/day for 40 years    Types: Cigarettes    Quit date: 04/16/2006  . Smokeless tobacco: Never Used  . Alcohol Use: No  . Drug Use: No  . Sexual  Activity: Not on file   Other Topics Concern  . Not on file   Social History Narrative   Widowed, 1 son   New relationship since 2013      No living will   Not sure about health care POA   Would accept resuscitation attempts but no prolonged ventilation.   Would accept tube feeds   Review of Systems Sleep is still not great--lately. Usually okay when not stressed Appetite is fine Weight is stable No falls No regular depression or anhedonia Gets bad calf cramps--may stay sore for days. Not clear that potassium helps. Drinks lots of water    Objective:   Physical Exam  Constitutional: She appears well-developed and well-nourished. No distress.  Neck: Normal range of motion. Neck supple. No thyromegaly present.  Cardiovascular: Normal rate, regular rhythm, normal heart sounds and intact distal pulses.  Exam reveals no gallop.   No murmur heard. Pulmonary/Chest: Effort normal and breath sounds normal. No respiratory distress. She has no wheezes. She has no rales.  Abdominal: Soft. There is no tenderness.  Musculoskeletal: She exhibits no edema or tenderness.  Lymphadenopathy:    She has no cervical adenopathy.  Psychiatric: She has a normal mood and affect. Her behavior is normal.          Assessment &  Plan:

## 2014-10-15 NOTE — Assessment & Plan Note (Signed)
Discussed primary prevention  She prefers no statin Discussed lifestyle

## 2014-10-15 NOTE — Assessment & Plan Note (Signed)
Quiet on the PPI 

## 2014-10-15 NOTE — Progress Notes (Signed)
Pre visit review using our clinic review tool, if applicable. No additional management support is needed unless otherwise documented below in the visit note. 

## 2014-10-22 ENCOUNTER — Other Ambulatory Visit (INDEPENDENT_AMBULATORY_CARE_PROVIDER_SITE_OTHER): Payer: Medicare Other

## 2014-10-22 DIAGNOSIS — E876 Hypokalemia: Secondary | ICD-10-CM | POA: Diagnosis not present

## 2014-10-22 LAB — POTASSIUM: Potassium: 4 mEq/L (ref 3.5–5.1)

## 2014-10-22 NOTE — Addendum Note (Signed)
Addended by: Alvina ChouWALSH, TERRI J on: 10/22/2014 08:31 AM   Modules accepted: Orders

## 2014-10-23 ENCOUNTER — Other Ambulatory Visit: Payer: Self-pay | Admitting: Internal Medicine

## 2014-10-29 ENCOUNTER — Other Ambulatory Visit: Payer: Self-pay | Admitting: Internal Medicine

## 2015-01-03 ENCOUNTER — Ambulatory Visit (INDEPENDENT_AMBULATORY_CARE_PROVIDER_SITE_OTHER): Payer: Medicare Other

## 2015-01-03 DIAGNOSIS — Z23 Encounter for immunization: Secondary | ICD-10-CM

## 2015-02-16 LAB — FECAL OCCULT BLOOD, GUAIAC: FECAL OCCULT BLD: NEGATIVE

## 2015-03-18 ENCOUNTER — Encounter: Payer: Self-pay | Admitting: Internal Medicine

## 2015-04-27 ENCOUNTER — Encounter: Payer: Self-pay | Admitting: Internal Medicine

## 2015-04-28 MED ORDER — OMEPRAZOLE 20 MG PO CPDR: 20.0000 mg | DELAYED_RELEASE_CAPSULE | Freq: Two times a day (BID) | ORAL | Status: DC

## 2015-05-09 DIAGNOSIS — Z01419 Encounter for gynecological examination (general) (routine) without abnormal findings: Secondary | ICD-10-CM | POA: Diagnosis not present

## 2015-05-09 DIAGNOSIS — Z779 Other contact with and (suspected) exposures hazardous to health: Secondary | ICD-10-CM | POA: Diagnosis not present

## 2015-05-10 ENCOUNTER — Other Ambulatory Visit: Payer: Self-pay | Admitting: Obstetrics and Gynecology

## 2015-05-10 DIAGNOSIS — N951 Menopausal and female climacteric states: Secondary | ICD-10-CM

## 2015-05-10 DIAGNOSIS — M8580 Other specified disorders of bone density and structure, unspecified site: Secondary | ICD-10-CM

## 2015-05-24 LAB — HM PAP SMEAR: HM Pap smear: NORMAL

## 2015-07-05 ENCOUNTER — Encounter: Payer: Self-pay | Admitting: Internal Medicine

## 2015-08-29 ENCOUNTER — Emergency Department: Payer: Medicare Other

## 2015-08-29 ENCOUNTER — Emergency Department
Admission: EM | Admit: 2015-08-29 | Discharge: 2015-08-29 | Disposition: A | Discharge status: Home / Self Care | Payer: Medicare Other | Attending: Emergency Medicine | Admitting: Emergency Medicine

## 2015-08-29 ENCOUNTER — Encounter: Payer: Self-pay | Admitting: Emergency Medicine

## 2015-08-29 ENCOUNTER — Telehealth: Payer: Self-pay | Admitting: Internal Medicine

## 2015-08-29 DIAGNOSIS — R0602 Shortness of breath: Secondary | ICD-10-CM | POA: Diagnosis not present

## 2015-08-29 DIAGNOSIS — Z7982 Long term (current) use of aspirin: Secondary | ICD-10-CM | POA: Diagnosis not present

## 2015-08-29 DIAGNOSIS — E785 Hyperlipidemia, unspecified: Secondary | ICD-10-CM | POA: Diagnosis not present

## 2015-08-29 DIAGNOSIS — R079 Chest pain, unspecified: Secondary | ICD-10-CM

## 2015-08-29 DIAGNOSIS — R0789 Other chest pain: Secondary | ICD-10-CM | POA: Diagnosis not present

## 2015-08-29 DIAGNOSIS — I10 Essential (primary) hypertension: Secondary | ICD-10-CM | POA: Insufficient documentation

## 2015-08-29 DIAGNOSIS — Z79899 Other long term (current) drug therapy: Secondary | ICD-10-CM | POA: Insufficient documentation

## 2015-08-29 DIAGNOSIS — Z87891 Personal history of nicotine dependence: Secondary | ICD-10-CM | POA: Insufficient documentation

## 2015-08-29 LAB — BASIC METABOLIC PANEL
ANION GAP: 9 (ref 5–15)
BUN: 35 mg/dL — ABNORMAL HIGH (ref 6–20)
CHLORIDE: 104 mmol/L (ref 101–111)
CO2: 25 mmol/L (ref 22–32)
Calcium: 9.8 mg/dL (ref 8.9–10.3)
Creatinine, Ser: 1.26 mg/dL — ABNORMAL HIGH (ref 0.44–1.00)
GFR calc Af Amer: 49 mL/min — ABNORMAL LOW (ref >60)
GFR, EST NON AFRICAN AMERICAN: 42 mL/min — AB (ref >60)
GLUCOSE: 109 mg/dL — AB (ref 65–99)
POTASSIUM: 3.1 mmol/L — AB (ref 3.5–5.1)
SODIUM: 138 mmol/L (ref 135–145)

## 2015-08-29 LAB — CBC
HEMATOCRIT: 35.7 % (ref 35.0–47.0)
HEMOGLOBIN: 11.2 g/dL — AB (ref 12.0–16.0)
MCH: 23.2 pg — ABNORMAL LOW (ref 26.0–34.0)
MCHC: 31.4 g/dL — ABNORMAL LOW (ref 32.0–36.0)
MCV: 73.9 fL — AB (ref 80.0–100.0)
Platelets: 347 10*3/uL (ref 150–440)
RBC: 4.84 MIL/uL (ref 3.80–5.20)
RDW: 17.4 % — ABNORMAL HIGH (ref 11.5–14.5)
WBC: 9.7 10*3/uL (ref 3.6–11.0)

## 2015-08-29 LAB — TROPONIN I: Troponin I: <0.03 ng/mL (ref <0.031)

## 2015-08-29 NOTE — ED Provider Notes (Signed)
[facility] Emergency Department Provider Note   ____________________________________________  Time seen: ~2135  I have reviewed the triage vital signs and the nursing notes.   HISTORY  Chief Complaint Chest Pain   History limited by: Not Limited   HPI Meghan Moody is a 70 y.o. female with history of hypertension and hyperlipidemia presents to the emergency department today because of concerns for shortness of breath and chest pressure. She states she first noticed some increasing shortness of breath 2 weeks ago. She states she went on a mile hike and noticed that she was more short of breath than she would've expected. In addition she states she did have chest pressure. She describes as being located in the center chest. Since that time she has had occasional episodes of shortness breath and chest pressure. She does relate to the time when she went to the [facility] to start working out and had the same symptoms. She denies any recent fevers. Denies any nausea or vomiting.   Past Medical History  Diagnosis Date  . Hypertension   . GERD (gastroesophageal reflux disease)   . Hyperlipidemia     Patient Active Problem List   Diagnosis Date Noted  . Situational stress 10/15/2014  . Stiff neck 11/10/2012  . Routine general medical examination at a health care facility 05/17/2011  . Hyperlipemia 10/25/2008  . GERD 04/19/2008  . Essential hypertension, benign 04/06/2008    Past Surgical History  Procedure Laterality Date  . Tubal ligation    . Bunionectomy      Current Outpatient Rx  Name  Route  Sig  Dispense  Refill  . acetaminophen (TYLENOL) 500 MG tablet   Oral   Take 500 mg by mouth every 8 (eight) hours as needed.         Marland Kitchen. aspirin 81 MG tablet   Oral   Take 81 mg by mouth daily.         . Chlorpheniramine Maleate (CHLOR-TABLETS PO)   Oral   Take by mouth as needed.         Marland Kitchen. KLOR-CON M20 20 MEQ tablet      TAKE 1 TABLET BY MOUTH  DAILY   90 tablet   2   . omeprazole (PRILOSEC) 20 MG capsule   Oral   Take 1 capsule (20 mg total) by mouth 2 (two) times daily before a meal.   180 capsule   3   . potassium chloride (K-DUR) 10 MEQ tablet   Oral   Take 1 tablet (10 mEq total) by mouth daily.   90 tablet   3   . Pseudoephedrine HCl (NASAL DECONGESTANT ADULT PO)   Oral   Take by mouth as needed.         . triamterene-hydrochlorothiazide (MAXZIDE-25) 37.5-25 MG per tablet   Oral   Take 1 tablet by mouth daily.   90 tablet   3     Allergies Review of patient's allergies indicates no known allergies.  Family History  Problem Relation Age of Onset  . Coronary artery disease Mother   . Stroke Father   . Drug abuse Brother     Social History Social History  Substance Use Topics  . Smoking status: Former Smoker -- 0.30 packs/day for 40 years    Types: Cigarettes    Quit date: 04/16/2006  . Smokeless tobacco: Never Used  . Alcohol Use: No    Review of Systems  Constitutional: Negative for fever. Cardiovascular: Positive for chest  pressure Respiratory: Positive for shortness of breath. Gastrointestinal: Negative for abdominal pain, vomiting and diarrhea. Neurological: Negative for headaches, focal weakness or numbness.  10-point ROS otherwise negative.  ____________________________________________   PHYSICAL EXAM:  VITAL SIGNS: ED Triage Vitals  Enc Vitals Group     BP 08/29/15 1707 153/70 mmHg     Pulse Rate 08/29/15 1707 84     Resp 08/29/15 1707 16     Temp 08/29/15 1707 97.8 F (36.6 C)     Temp Source 08/29/15 1707 Oral     SpO2 08/29/15 1707 94 %     Weight 08/29/15 1707 148 lb (67.132 kg)     Height 08/29/15 1707  (1.651 m)     Head Cir --      Peak Flow --      Pain Score 08/29/15 1708 0   Constitutional: Alert and oriented. Well appearing and in no distress. Eyes: Conjunctivae are normal. PERRL. Normal extraocular movements. ENT   Head: Normocephalic and  atraumatic.   Nose: No congestion/rhinnorhea.   Mouth/Throat: Mucous membranes are moist.   Neck: No stridor. Hematological/Lymphatic/Immunilogical: No cervical lymphadenopathy. Cardiovascular: Normal rate, regular rhythm.  No murmurs, rubs, or gallops. Respiratory: Normal respiratory effort without tachypnea nor retractions. Breath sounds are clear and equal bilaterally. No wheezes/rales/rhonchi. Gastrointestinal: Soft and nontender. No distention.  Genitourinary: Deferred Musculoskeletal: Normal range of motion in all extremities. No joint effusions.  No lower extremity tenderness nor edema. Neurologic:  Normal speech and language. No gross focal neurologic deficits are appreciated.  Skin:  Skin is warm, dry and intact. No rash noted. Psychiatric: Mood and affect are normal. Speech and behavior are normal. Patient exhibits appropriate insight and judgment.  ____________________________________________    LABS (pertinent positives/negatives)  Labs Reviewed  BASIC METABOLIC PANEL - Abnormal; Notable for the following:    Potassium 3.1 (*)    Glucose, Bld 109 (*)    BUN 35 (*)    Creatinine, Ser 1.26 (*)    GFR calc non Af Amer 42 (*)    GFR calc Af Amer 49 (*)    All other components within normal limits  CBC - Abnormal; Notable for the following:    Hemoglobin 11.2 (*)    MCV 73.9 (*)    MCH 23.2 (*)    MCHC 31.4 (*)    RDW 17.4 (*)    All other components within normal limits  TROPONIN I     ____________________________________________   EKG  I, Phineas Semen, attending physician, personally viewed and interpreted this EKG  EKG Time: 1704 Rate: 85 Rhythm: normal sinus rhythm Axis: normal Intervals: qtc 440 QRS: narrow ST changes: no st elevation Impression: normal ekg   ____________________________________________    RADIOLOGY  CXR  IMPRESSION: No active cardiopulmonary disease.  Hiatal  hernia.  ____________________________________________   PROCEDURES  Procedure(s) performed: None  Critical Care performed: No  ____________________________________________   INITIAL IMPRESSION / ASSESSMENT AND PLAN / ED COURSE  Pertinent labs & imaging results that were available during my care of the patient were reviewed by me and considered in my medical decision making (see chart for details).  Patient presented to the emergency department today because of concerns for shortness of breath and chest pressure. Patient does have risk factors of high blood pressure and hyperlipidemia. EKG however without any concerning findings. Initial troponin negative. Will check a second troponin.   Second troponin negative. Patient will plan to follow up with cardiology.  ____________________________________________   FINAL CLINICAL  IMPRESSION(S) / ED DIAGNOSES  Final diagnoses:  Chest pain, unspecified chest pain type     Phineas Semen, MD 08/29/15 2254

## 2015-08-29 NOTE — Discharge Instructions (Signed)
Please seek medical attention for any high fevers, chest pain, shortness of breath, change in behavior, persistent vomiting, bloody stool or any other new or concerning symptoms. ° ° °Nonspecific Chest Pain °It is often hard to find the cause of chest pain. There is always a chance that your pain could be related to something serious, such as a heart attack or a blood clot in your lungs. Chest pain can also be caused by conditions that are not life-threatening. If you have chest pain, it is very important to follow up with your doctor. ° °HOME CARE °· If you were prescribed an antibiotic medicine, finish it all even if you start to feel better. °· Avoid any activities that cause chest pain. °· Do not use any tobacco products, including cigarettes, chewing tobacco, or electronic cigarettes. If you need help quitting, ask your doctor. °· Do not drink alcohol. °· Take medicines only as told by your doctor. °· Keep all follow-up visits as told by your doctor. This is important. This includes any further testing if your chest pain does not go away. °· Your doctor may tell you to keep your head raised (elevated) while you sleep. °· Make lifestyle changes as told by your doctor. These may include: °¨ Getting regular exercise. Ask your doctor to suggest some activities that are safe for you. °¨ Eating a heart-healthy diet. Your doctor or a diet specialist (dietitian) can help you to learn healthy eating options. °¨ Maintaining a healthy weight. °¨ Managing diabetes, if necessary. °¨ Reducing stress. °GET HELP IF: °· Your chest pain does not go away, even after treatment. °· You have a rash with blisters on your chest. °· You have a fever. °GET HELP RIGHT AWAY IF: °· Your chest pain is worse. °· You have an increasing cough, or you cough up blood. °· You have severe belly (abdominal) pain. °· You feel extremely weak. °· You pass out (faint). °· You have chills. °· You have sudden, unexplained chest discomfort. °· You have  sudden, unexplained discomfort in your arms, back, neck, or jaw. °· You have shortness of breath at any time. °· You suddenly start to sweat, or your skin gets clammy. °· You feel nauseous. °· You vomit. °· You suddenly feel light-headed or dizzy. °· Your heart begins to beat quickly, or it feels like it is skipping beats. °These symptoms may be an emergency. Do not wait to see if the symptoms will go away. Get medical help right away. Call your local emergency services (911 in the [place] Do not drive yourself to the hospital. °  °This information is not intended to replace advice given to you by your health care provider. Make sure you discuss any questions you have with your health care provider. °  °Document Released: 09/19/2007 Document Revised: 04/23/2014 Document Reviewed: 11/06/2013 °Elsevier Interactive Patient Education ©2016 [employer] Inc[employer] ° °

## 2015-08-29 NOTE — Telephone Encounter (Signed)
Per chart review tab pt is at [facility] ED now. 

## 2015-08-29 NOTE — ED Notes (Signed)
Pt reports shortness of breath x2 weeks; left sided chest pain that started 3 days ago without radiation. Pt also reports fatigue lately. Pt never diagnosed with COPD but hx of 20+ years smoking, also reports hx of damage to lungs by mixing ammonia and bleach together about 45 years ago.

## 2015-08-29 NOTE — Telephone Encounter (Signed)
Will await the ER evaluation 

## 2015-08-29 NOTE — Telephone Encounter (Signed)
[facility] - Client TELEPHONE ADVICE RECORD [facility]  Patient Name: Meghan Moody  DOB: 10-Aug-1945    Initial Comment Caller states she is having SOB w/ tightness in her chest. This is after she finished working out at [employer] and walking at [employer].   Nurse Assessment  Nurse: Laural BenesJohnson, RN, Dondra SpryGail Date/Time Lamount Cohen(Eastern Time): 08/29/2015 4:12:33 PM  Confirm and document reason for call. If symptomatic, describe symptoms. You must click the next button to save text entered. ---Carney BernJean is having shortness of breath and tightness in chest- feels wiped out -- after [place] [employer] and [employer]  Has the patient traveled out of the country within the last 30 days? ---No  Does the patient have any new or worsening symptoms? ---Yes  Will a triage be completed? ---Yes  Related visit to physician within the last 2 weeks? ---No  Does the PT have any chronic conditions? (i.e. diabetes, asthma, etc.) ---No  Is this a behavioral health or substance abuse call? ---No     Guidelines    Guideline Title Affirmed Question Affirmed Notes  Chest Pain [1] Chest pain lasts > 5 minutes AND [2] age > 250    Final Disposition User   Call EMS 911 Now Laural BenesJohnson, RN, [facility]    Referrals  [facility] - ED   Disagree/Comply: Comply

## 2015-08-30 ENCOUNTER — Telehealth: Payer: Self-pay

## 2015-08-30 NOTE — Telephone Encounter (Signed)
Needs follow up with me or cardiologist (if she prefers)

## 2015-08-30 NOTE — Telephone Encounter (Signed)
Okay 

## 2015-08-30 NOTE — Telephone Encounter (Signed)
I spoke to patient.  Patient said ER told her to follow up with Dr.Klein and keep appointment with Dr.Letvak on 10/21/15 for wellness exam. Patient wants to know if Dr.Letvak recommends Dr.Klein.  She'd like to go to [place] Development worker[place]

## 2015-08-30 NOTE — Telephone Encounter (Signed)
PLEASE NOTE: All timestamps contained within this report are represented as Eastern Standard Time. CONFIDENTIALTY NOTICE: This fax transmission is intended only for the addressee. It contains information that is legally privileged, confidential or otherwise protected from use or disclosure. If you are not the intended recipient, you are strictly prohibited from reviewing, disclosing, copying using or disseminating any of this information or taking any action in reliance on or regarding this information. If you have received this fax in error, please notify us immediately by telephone so that we can arrange for its return to us. Phone: [phone], Toll-Free: [phone], Fax: [phone] Page: 1 of 2 Call Id: [id number] [facility] - Client TELEPHONE ADVICE RECORD [facility] Patient Name: Meghan Moody Gender: Female DOB: 01/23/1946 Age: 69 Y 8 M 18 D Return Phone Number: [phone] (Primary), [phone] (Secondary), [phone] (Alternate) Address: City/State/Zip: [place] [address] Client [facility] - Client Client Site [facility] - Day Physician Letvak, Richard - MD Contact Type Call Who Is Calling Patient / Member / Family / Caregiver Call Type Triage / Clinical Relationship To Patient Self Return Phone Number [phone] (Primary) Chief Complaint BREATHING - shortness of breath or sounds breathless Reason for Call Symptomatic / Request for Health Information Initial Comment Caller states she is having SOB w/ tightness in her chest. This is after she finished working out at [employer] and walking at [employer]. Appointment Disposition EMR Appointment Not Necessary Info pasted into Epic Yes PreDisposition Call Doctor Translation No Nurse Assessment Nurse: Johnson, RN, Gail Date/Time (Eastern Time): 08/29/2015 4:12:33 PM Confirm and document reason for call. If symptomatic, describe  symptoms. You must click the next button to save text entered. ---Ilina is having shortness of breath and tightness in chest- feels wiped out -- after [place] [employer] [place] [employer] Has the patient traveled out of the country within the last 30 days? ---No Does the patient have any new or worsening symptoms? ---Yes Will a triage be completed? ---Yes Related visit to physician within the last 2 weeks? ---No Does the PT have any chronic conditions? (i.e. diabetes, asthma, etc.) ---No Is this a behavioral health or substance abuse call? ---No Guidelines Guideline Title Affirmed Question Affirmed Notes Nurse Date/Time (Eastern Time) Chest Pain [1] Chest pain lasts > 5 minutes AND [2] age > 50 Johnson, RN, Gail 08/29/2015 4:14:37 PM Disp. Time (Eastern Time) Disposition Final User 08/29/2015 4:06:36 PM Send to Urgent Queue Vincent, Janise 08/29/2015 4:25:33 PM 911 Outcome Documentation Johnson, RN, Gail PLEASE NOTE: All timestamps contained within this report are represented as Eastern Standard Time. CONFIDENTIALTY NOTICE: This fax transmission is intended only for the addressee. It contains information that is legally privileged, confidential or otherwise protected from use or disclosure. If you are not the intended recipient, you are strictly prohibited from reviewing, disclosing, copying using or disseminating any of this information or taking any action in reliance on or regarding this information. If you have received this fax in error, please notify us immediately by telephone so that we can arrange for its return to us. Phone: [phone], Toll-Free: [phone], Fax: [phone] Page: 2 of 2 Call Id: [id number] Disp. Time (Eastern Time) Disposition Final User Reason: wants to drive -- Nurse adamantly told her NO she decided to call a friend who will take to her to ED instead of calling 911 she is on her way. 08/29/2015 4:16:33 PM Call EMS 911 Now Yes Johnson, RN, Gail Caller    Understands: Yes Disagree/Comply: Comply Care Advice Given Per Guideline CALL EMS 911 NOW: Immediate medical attention is needed. You need to hang up and call 911 (or an ambulance). Probation officer(Triager Discretion: I'll call you back in a few minutes to be sure you were able to reach them.) IF CALLER ASKS ABOUT ASPIRIN: * Call EMS 911 first. CARE ADVICE given per Chest Pain (Adult) guideline. Referrals [facility] - ED

## 2015-08-30 NOTE — Telephone Encounter (Signed)
PLEASE NOTE: All timestamps contained within this report are represented as [place] Standard Time. CONFIDENTIALTY NOTICE: This fax transmission is intended only for the addressee. It contains information that is legally privileged, confidential or otherwise protected from use or disclosure. If you are not the intended recipient, you are strictly prohibited from reviewing, disclosing, copying using or disseminating any of this information or taking any action in reliance on or regarding this information. If you have received this fax in error, please notify us immediately by telephone so that we can arrange for its return to [place]. Phone: [phone], Toll-Free: [phone], Fax: [phone] Page: 1 of 2 Call Id: [id number] [facility] - Client TELEPHONE ADVICE RECORD [facility] Patient Name: Meghan Moody Gender: Female DOB: Aug 17, 1945 Age: 70 Y 0 M 0 D Return Phone Number: [phone] (Primary), [phone] (Secondary), [phone] (Alternate) Address: City/State/Zip: [place] [address] Client [facility] - Client Client Site [facility] - Day Physician Tillman Abide - MD Contact Type Call Who Is Calling Patient / Member / Family / Caregiver Call Type Triage / Clinical Relationship To Patient Self Return Phone Number [phone] (Primary) Chief Complaint BREATHING - shortness of breath or sounds breathless Reason for Call Symptomatic / Request for Health Information Initial Comment Caller states she is having SOB w/ tightness in her chest. This is after she finished working out at [employer] and walking at [employer]. Appointment Disposition EMR Appointment Not Necessary Info pasted into Epic Yes PreDisposition Call Doctor Translation No Nurse Assessment Nurse: Laural Benes, RN, Dondra Spry Date/Time Lamount Cohen Time): 08/29/2015 4:12:33 PM Confirm and document reason for call. If symptomatic, describe  symptoms. You must click the next button to save text entered. ---Ronesha is having shortness of breath and tightness in chest- feels wiped out -- [place] [employer] Has the patient traveled out of the country within the last 30 days? ---No Does the patient have any new or worsening symptoms? ---Yes Will a triage be completed? ---Yes Related visit to physician within the last 2 weeks? ---No Does the PT have any chronic conditions? (i.e. diabetes, asthma, etc.) ---No Is this a behavioral health or substance abuse call? ---No Guidelines Guideline Title Affirmed Question Affirmed Notes Nurse Date/Time Lamount Cohen Time) Chest Pain [1] Chest pain lasts > 5 minutes AND [2] age > 57 Liberty Handy 08/29/2015 4:14:37 PM Disp. Time Lamount Cohen Time) Disposition Final User 08/29/2015 4:06:36 PM Send to Urgent Delrae Alfred 08/29/2015 4:25:33 PM 911 Outcome Documentation Laural Benes, RN, Dondra Spry PLEASE NOTE: All timestamps contained within this report are represented as [place] Standard Time. CONFIDENTIALTY NOTICE: This fax transmission is intended only for the addressee. It contains information that is legally privileged, confidential or otherwise protected from use or disclosure. If you are not the intended recipient, you are strictly prohibited from reviewing, disclosing, copying using or disseminating any of this information or taking any action in reliance on or regarding this information. If you have received this fax in error, please notify us immediately by telephone so that we can arrange for its return to [place]. Phone: [phone], Toll-Free: [phone], Fax: [phone] Page: 2 of 2 Call Id: [id number] Disp. Time (Eastern Time) Disposition Final User Reason: wants to drive -- Nurse adamantly told her NO she decided to call a friend who will take to her to ED instead of calling 911 she is on her way. 08/29/2015 4:16:33 PM Call EMS 911 Now Yes Laural Benes, RN, Suzi Roots  Understands: Yes Disagree/Comply: Comply Care Advice Given Per Guideline CALL EMS 911 NOW: Immediate medical attention is needed. You need to hang up and call 911 (or an ambulance). Probation officer(Triager Discretion: I'll call you back in a few minutes to be sure you were able to reach them.) IF CALLER ASKS ABOUT ASPIRIN: * Call EMS 911 first. CARE ADVICE given per Chest Pain (Adult) guideline. Referrals [facility] - ED

## 2015-08-30 NOTE — Telephone Encounter (Signed)
Patient scheduled appointment with Dr.Letvak on 08/31/15 at 8:15.

## 2015-08-30 NOTE — Telephone Encounter (Signed)
Per chart review tab pt seen [facility] ED on 08/29/15.

## 2015-08-30 NOTE — Telephone Encounter (Signed)
Dr Graciela HusbandsKlein is an electrophysiologist--she just needs a regular cardiologist like [place] or [place]. I could also see her and set up a stress test--if that is normal, she doesn't need a cardiologist

## 2015-08-31 ENCOUNTER — Encounter: Payer: Self-pay | Admitting: Internal Medicine

## 2015-08-31 ENCOUNTER — Ambulatory Visit (INDEPENDENT_AMBULATORY_CARE_PROVIDER_SITE_OTHER): Payer: Medicare Other | Admitting: Internal Medicine

## 2015-08-31 VITALS — BP 120/80 | HR 73 | Temp 97.4°F | Wt 147.0 lb

## 2015-08-31 DIAGNOSIS — R0789 Other chest pain: Secondary | ICD-10-CM | POA: Insufficient documentation

## 2015-08-31 DIAGNOSIS — R079 Chest pain, unspecified: Secondary | ICD-10-CM | POA: Diagnosis not present

## 2015-08-31 LAB — RENAL FUNCTION PANEL
Albumin: 4.1 g/dL (ref 3.5–5.2)
BUN: 35 mg/dL — AB (ref 6–23)
CALCIUM: 9.3 mg/dL (ref 8.4–10.5)
CO2: 28 meq/L (ref 19–32)
CREATININE: 1.1 mg/dL (ref 0.40–1.20)
Chloride: 103 mEq/L (ref 96–112)
GFR: 52.08 mL/min — ABNORMAL LOW (ref >60.00)
Glucose, Bld: 92 mg/dL (ref 70–99)
Phosphorus: 3 mg/dL (ref 2.3–4.6)
Potassium: 3.3 mEq/L — ABNORMAL LOW (ref 3.5–5.1)
Sodium: 140 mEq/L (ref 135–145)

## 2015-08-31 NOTE — Assessment & Plan Note (Signed)
Exertional with concerning history EKG normal--and sinus brady at rest Discussed her meds--may be contributing to not feeling well (or could be marker of cardiac issue) Will have her stop the decongestant Recheck renal profile Treadmill exercise test--- cardiology if abnormal

## 2015-08-31 NOTE — Patient Instructions (Signed)
Please switch the cetirizine to evening. Stop the phenylephrine for now.

## 2015-08-31 NOTE — Progress Notes (Signed)
   Subjective:    Patient ID: Meghan Moody, female    DOB: 1945/09/08, 70 y.o.   MRN: [id number]  HPI ER follow up for chest pain  "I really don't feel great"--- goes back 3 weeks or so Gets breathless when working out at [employer]--very easy DOE Feels sleepy and tired a lot Not really chest pain--just some pressure ("scared like feeling") No nausea or diaphoresis  Current Outpatient Prescriptions on File Prior to Visit  Medication Sig Dispense Refill  . acetaminophen (TYLENOL) 500 MG tablet Take 500 mg by mouth every 8 (eight) hours as needed.    Marland Kitchen. aspirin 81 MG tablet Take 81 mg by mouth daily.    Marland Kitchen. omeprazole (PRILOSEC) 20 MG capsule Take 1 capsule (20 mg total) by mouth 2 (two) times daily before a meal. 180 capsule 3  . potassium chloride (K-DUR) 10 MEQ tablet Take 1 tablet (10 mEq total) by mouth daily. 90 tablet 3  . Pseudoephedrine HCl (NASAL DECONGESTANT ADULT PO) Take by mouth as needed.    . triamterene-hydrochlorothiazide (MAXZIDE-25) 37.5-25 MG per tablet Take 1 tablet by mouth daily. 90 tablet 3   No current facility-administered medications on file prior to visit.    No Known Allergies  Past Medical History  Diagnosis Date  . Hypertension   . GERD (gastroesophageal reflux disease)   . Hyperlipidemia     Past Surgical History  Procedure Laterality Date  . Tubal ligation    . Bunionectomy      Family History  Problem Relation Age of Onset  . Coronary artery disease Mother   . Stroke Father   . Drug abuse Brother     Social History   Social History  . Marital Status: Married    Spouse Name: N/A  . Number of Children: 1  . Years of Education: N/A   Occupational History  . 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 Smoking status: Former Smoker -- 0.30 packs/day for 40 years    Types: Cigarettes    Quit date: 04/16/2006  . Smokeless tobacco: Never Used  . Alcohol Use: No  . Drug Use: No  . Sexual Activity: Not on file    Other Topics Concern  . Not on file   Social History Narrative   Widowed, 1 son   New relationship since 2013      No living will   Not sure about health care POA   Would accept resuscitation attempts but no prolonged ventilation.   Would accept tube feeds   Review of Systems Some burning in legs with exertion also No fever or illness preceding all this Rarely takes the soma Using cetirizine just about every day--- also phenylephrine    Objective:   Physical Exam  Constitutional: She appears well-developed and well-nourished. No distress.  Neck: Normal range of motion. Neck supple. No thyromegaly present.  Cardiovascular: Normal rate, regular rhythm, normal heart sounds and intact distal pulses.  Exam reveals no gallop.   No murmur heard. Pulmonary/Chest: Effort normal and breath sounds normal. No respiratory distress. She has no wheezes. She has no rales.  Abdominal: Soft. There is no tenderness.  Musculoskeletal: She exhibits no edema.  Lymphadenopathy:    She has no cervical adenopathy.  Psychiatric: She has a normal mood and affect. Her behavior is normal.          Assessment & Plan:

## 2015-08-31 NOTE — Progress Notes (Signed)
Pre visit review using our clinic review tool, if applicable. No additional management support is needed unless otherwise documented below in the visit note. 

## 2015-09-05 ENCOUNTER — Ambulatory Visit (INDEPENDENT_AMBULATORY_CARE_PROVIDER_SITE_OTHER): Payer: Medicare Other

## 2015-09-05 DIAGNOSIS — R079 Chest pain, unspecified: Secondary | ICD-10-CM

## 2015-09-06 ENCOUNTER — Other Ambulatory Visit: Payer: Self-pay | Admitting: Internal Medicine

## 2015-09-06 NOTE — Telephone Encounter (Signed)
Last filled 03-02-14 #30. It is still on current med list. Last OV 09-05-15 Next OV 10-21-15

## 2015-09-07 LAB — EXERCISE TOLERANCE TEST
CHL CUP MPHR: 150 {beats}/min
CHL CUP RESTING HR STRESS: 87 {beats}/min
CHL CUP STRESS STAGE 2 GRADE: 0 %
CHL CUP STRESS STAGE 2 SPEED: 1 mph
CHL CUP STRESS STAGE 3 GRADE: 0 %
CHL CUP STRESS STAGE 4 HR: 118 {beats}/min
CHL CUP STRESS STAGE 5 DBP: 77 mmHg
CHL CUP STRESS STAGE 5 SPEED: 2.5 mph
CHL CUP STRESS STAGE 6 GRADE: 12 %
CHL CUP STRESS STAGE 7 GRADE: 0 %
CHL CUP STRESS STAGE 7 HR: 112 {beats}/min
CHL RATE OF PERCEIVED EXERTION: 18
CSEPED: 6 min
CSEPEDS: 0 s
CSEPEW: 7 METS
CSEPHR: 87 %
CSEPPHR: 131 {beats}/min
CSEPPMHR: 87 %
Stage 1 DBP: 78 mmHg
Stage 1 Grade: 0 %
Stage 1 HR: 88 {beats}/min
Stage 1 SBP: 129 mmHg
Stage 1 Speed: 0 mph
Stage 2 HR: 88 {beats}/min
Stage 3 HR: 88 {beats}/min
Stage 3 Speed: 1 mph
Stage 4 DBP: 76 mmHg
Stage 4 Grade: 10 %
Stage 4 SBP: 165 mmHg
Stage 4 Speed: 1.7 mph
Stage 5 Grade: 12 %
Stage 5 HR: 131 {beats}/min
Stage 5 SBP: 175 mmHg
Stage 6 HR: 131 {beats}/min
Stage 6 Speed: 2.5 mph
Stage 7 DBP: 73 mmHg
Stage 7 SBP: 156 mmHg
Stage 7 Speed: 0 mph
Stage 8 Grade: 0 %
Stage 8 HR: 93 {beats}/min
Stage 8 Speed: 0 mph

## 2015-09-07 NOTE — Telephone Encounter (Signed)
Left refill on voice mail at pharmacy  

## 2015-09-07 NOTE — Telephone Encounter (Signed)
Approved: 30 x 0 

## 2015-10-15 ENCOUNTER — Other Ambulatory Visit: Payer: Self-pay | Admitting: Internal Medicine

## 2015-10-21 ENCOUNTER — Encounter: Payer: Self-pay | Admitting: Internal Medicine

## 2015-10-21 ENCOUNTER — Ambulatory Visit (INDEPENDENT_AMBULATORY_CARE_PROVIDER_SITE_OTHER): Payer: Medicare Other | Admitting: Internal Medicine

## 2015-10-21 VITALS — BP 126/70 | HR 70 | Temp 97.9°F | Ht 65.5 in | Wt 148.0 lb

## 2015-10-21 DIAGNOSIS — Z0000 Encounter for general adult medical examination without abnormal findings: Secondary | ICD-10-CM

## 2015-10-21 DIAGNOSIS — E785 Hyperlipidemia, unspecified: Secondary | ICD-10-CM | POA: Diagnosis not present

## 2015-10-21 DIAGNOSIS — K2100 Gastro-esophageal reflux disease with esophagitis, without bleeding: Secondary | ICD-10-CM

## 2015-10-21 DIAGNOSIS — Z23 Encounter for immunization: Secondary | ICD-10-CM

## 2015-10-21 DIAGNOSIS — Z1211 Encounter for screening for malignant neoplasm of colon: Secondary | ICD-10-CM

## 2015-10-21 DIAGNOSIS — I10 Essential (primary) hypertension: Secondary | ICD-10-CM

## 2015-10-21 DIAGNOSIS — Z7189 Other specified counseling: Secondary | ICD-10-CM | POA: Insufficient documentation

## 2015-10-21 LAB — COMPREHENSIVE METABOLIC PANEL
ALBUMIN: 4.1 g/dL (ref 3.5–5.2)
ALT: 15 U/L (ref 0–35)
AST: 16 U/L (ref 0–37)
Alkaline Phosphatase: 90 U/L (ref 39–117)
BILIRUBIN TOTAL: 0.4 mg/dL (ref 0.2–1.2)
BUN: 31 mg/dL — ABNORMAL HIGH (ref 6–23)
CALCIUM: 9.8 mg/dL (ref 8.4–10.5)
CO2: 31 mEq/L (ref 19–32)
CREATININE: 1.06 mg/dL (ref 0.40–1.20)
Chloride: 105 mEq/L (ref 96–112)
GFR: 54.34 mL/min — ABNORMAL LOW (ref >60.00)
Glucose, Bld: 95 mg/dL (ref 70–99)
Potassium: 3.5 mEq/L (ref 3.5–5.1)
Sodium: 142 mEq/L (ref 135–145)
Total Protein: 6.8 g/dL (ref 6.0–8.3)

## 2015-10-21 LAB — CBC WITH DIFFERENTIAL/PLATELET
BASOS ABS: 0.1 10*3/uL (ref 0.0–0.1)
Basophils Relative: 1.1 % (ref 0.0–3.0)
EOS ABS: 0.4 10*3/uL (ref 0.0–0.7)
Eosinophils Relative: 5.8 % — ABNORMAL HIGH (ref 0.0–5.0)
HEMATOCRIT: 34.9 % — AB (ref 36.0–46.0)
HEMOGLOBIN: 10.7 g/dL — AB (ref 12.0–15.0)
LYMPHS PCT: 23.1 % (ref 12.0–46.0)
Lymphs Abs: 1.6 10*3/uL (ref 0.7–4.0)
MCHC: 30.7 g/dL (ref 30.0–36.0)
MCV: 73.6 fl — ABNORMAL LOW (ref 78.0–100.0)
Monocytes Absolute: 0.5 10*3/uL (ref 0.1–1.0)
Monocytes Relative: 7.2 % (ref 3.0–12.0)
Neutro Abs: 4.4 10*3/uL (ref 1.4–7.7)
Neutrophils Relative %: 62.8 % (ref 43.0–77.0)
PLATELETS: 385 10*3/uL (ref 150.0–400.0)
RBC: 4.75 Mil/uL (ref 3.87–5.11)
RDW: 18.6 % — ABNORMAL HIGH (ref 11.5–15.5)
WBC: 7.1 10*3/uL (ref 4.0–10.5)

## 2015-10-21 LAB — LIPID PANEL
CHOLESTEROL: 207 mg/dL — AB (ref 0–200)
HDL: 41 mg/dL (ref >39.00)
LDL CALC: 135 mg/dL — AB (ref 0–99)
NonHDL: 165.65
TRIGLYCERIDES: 154 mg/dL — AB (ref 0.0–149.0)
Total CHOL/HDL Ratio: 5
VLDL: 30.8 mg/dL (ref 0.0–40.0)

## 2015-10-21 LAB — T4, FREE: Free T4: 1.03 ng/dL (ref 0.60–1.60)

## 2015-10-21 NOTE — Addendum Note (Signed)
Addended by: Eual FinesBRIDGES, SHANNON P on: 10/21/2015 09:30 AM   Modules accepted: Orders

## 2015-10-21 NOTE — Assessment & Plan Note (Signed)
I have personally reviewed the Medicare Annual Wellness questionnaire and have noted 1. The patient's medical and social history 2. Their use of alcohol, tobacco or illicit drugs 3. Their current medications and supplements 4. The patient's functional ability including ADL's, fall risks, home safety risks and hearing or visual             impairment. 5. Diet and physical activities 6. Evidence for depression or mood disorders  The patients weight, height, BMI and visual acuity have been recorded in the chart I have made referrals, counseling and provided education to the patient based review of the above and I have provided the pt with a written personalized care plan for preventive services.  I have provided you with a copy of your personalized plan for preventive services. Please take the time to review along with your updated medication list.  prevnar today Yearly flu shot FIT Mammo/DEXA ordered by gyn Discussed fitness

## 2015-10-21 NOTE — Progress Notes (Signed)
Pre visit review using our clinic review tool, if applicable. No additional management support is needed unless otherwise documented below in the visit note. 

## 2015-10-21 NOTE — Assessment & Plan Note (Signed)
Discussed primary prevention--she wants to hold off

## 2015-10-21 NOTE — Assessment & Plan Note (Signed)
Forms given

## 2015-10-21 NOTE — Assessment & Plan Note (Signed)
Has LPR also Needs to continue the PPI

## 2015-10-21 NOTE — Progress Notes (Signed)
Subjective:    Patient ID: Meghan Moody, female    DOB: Apr 24, 1945, 70 y.o.   MRN: [id number]  HPI Here for initial Medicare wellness and follow up of chronic health conditions Reviewed form and advanced directives Reviewed other doctors No alcohol or tobacco Not really able to exercise Vision is okay---may need new prescription Deaf in left ear--does okay though No falls No true depression or anhedonia Independent with instrumental ADLs No apparent cognitive problems---just feels overloaded  Still gets some cough spasm--wonders if she aspirates saliva Then will have persistent cough anytime she tries to talk Also may have aspirated popcorn at movie theater Ongoing reflux symptoms-- has burning with eating if she misses meds She feels the decongestant helps  No chest pain since the hospital "Overwhelmed" 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 acetaminophen (TYLENOL) 500 MG tablet Take 500 mg by mouth every 8 (eight) hours as needed.    Marland Kitchen. aspirin 81 MG tablet Take 81 mg by mouth daily.    . carisoprodol (SOMA) 350 MG tablet TAKE 1 TABLET 3 TIMES A DAY 30 tablet 0  . cetirizine (ZYRTEC) 10 MG tablet Take 10 mg by mouth daily.    . Multiple Vitamin (MULTIVITAMIN) tablet Take 1 tablet by mouth daily.    . Omega-3 Fatty Acids (FISH OIL CONCENTRATE PO) Take 3 tablets by mouth.    Marland Kitchen. omeprazole (PRILOSEC) 20 MG capsule Take 1 capsule (20 mg total) by mouth 2 (two) times daily before a meal. 180 capsule 3  . potassium chloride (K-DUR) 10 MEQ tablet Take 1 tablet (10 mEq total) by mouth daily. 90 tablet 3  .  Pseudoephedrine HCl (NASAL DECONGESTANT ADULT PO) Take by mouth as needed.    . triamterene-hydrochlorothiazide (MAXZIDE-25) 37.5-25 MG tablet TAKE 1 TABLET BY MOUTH DAILY. 90 tablet 3   No current facility-administered medications on file prior to visit.    No Known Allergies  Past Medical History  Diagnosis Date  . Hypertension   . GERD (gastroesophageal reflux disease)   . Hyperlipidemia     Past Surgical History  Procedure Laterality Date  . Tubal ligation    . Bunionectomy      Family History  Problem Relation Age of Onset  . Coronary artery disease Mother   . Heart disease Mother   . Hypertension Mother   . Stroke Father   . Drug abuse Brother     Social History   Social History  . Marital Status: Widowed    Spouse Name: N/A  . Number of Children: 1  . Years of Education: N/A   Occupational History  . 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 Smoking status: Former Smoker -- 0.30 packs/day for 40 years    Types: Cigarettes    Quit date: 04/16/2006  . Smokeless tobacco: Never Used  . Alcohol Use: No  . Drug Use: No  . Sexual Activity: Not on file   Other Topics Concern  . Not on file   Social History Narrative   Widowed, 1 son   New relationship since 2013  No living will   Not sure about health care POA-- probably niece Meghan Moody   Would accept resuscitation attempts but no prolonged ventilation.   Would probably not accept tube feeds   Review of Systems Appetite is fine Weight is stable Sleep is not always good--staying with mom now Wears seat belt Teeth okay--- keeps up with dentist (Dr Tiburcio PeaHarris) Bowels are okay--no blood in stool Voids okay. Occasional urge incontinence Chronic back pain and hands---uses tylenol or ibuprofen. Rare soma use No rash or suspicious lesions    Objective:   Physical Exam  Constitutional: She is oriented to person, place, and time. She appears well-developed and  well-nourished. No distress.  HENT:  Mouth/Throat: Oropharynx is clear and moist. No oropharyngeal exudate.  Neck: Normal range of motion. Neck supple. No thyromegaly present.  Cardiovascular: Normal rate, regular rhythm, normal heart sounds and intact distal pulses.  Exam reveals no gallop.   No murmur heard. Pulmonary/Chest: Effort normal and breath sounds normal. No respiratory distress. She has no wheezes. She has no rales.  Abdominal: Soft. There is no tenderness.  Musculoskeletal: She exhibits no edema or tenderness.  Lymphadenopathy:    She has no cervical adenopathy.  Neurological: She is alert and oriented to person, place, and time.  President-- "Benson [place]rump, Obama, Bush" 100-93-87-80-73-67 D-l-r-o-w Recall 3/3  Skin: No rash noted. No erythema.  Psychiatric: She has a normal mood and affect. Her behavior is normal.          Assessment & Plan:

## 2015-10-21 NOTE — Assessment & Plan Note (Signed)
BP Readings from Last 3 Encounters:  10/21/15 126/70  08/31/15 120/80  08/29/15 145/77   Good control Will recheck labs

## 2015-12-04 ENCOUNTER — Other Ambulatory Visit: Payer: Self-pay | Admitting: Internal Medicine

## 2015-12-26 DIAGNOSIS — H2513 Age-related nuclear cataract, bilateral: Secondary | ICD-10-CM | POA: Diagnosis not present

## 2016-03-12 ENCOUNTER — Ambulatory Visit (INDEPENDENT_AMBULATORY_CARE_PROVIDER_SITE_OTHER): Payer: Medicare Other

## 2016-03-12 DIAGNOSIS — Z23 Encounter for immunization: Secondary | ICD-10-CM | POA: Diagnosis not present

## 2016-04-07 ENCOUNTER — Other Ambulatory Visit: Payer: Self-pay | Admitting: Internal Medicine

## 2016-05-06 ENCOUNTER — Other Ambulatory Visit: Payer: Self-pay | Admitting: Internal Medicine

## 2016-09-01 ENCOUNTER — Other Ambulatory Visit: Payer: Self-pay | Admitting: Internal Medicine

## 2016-10-04 ENCOUNTER — Other Ambulatory Visit: Payer: Self-pay | Admitting: Internal Medicine

## 2016-10-22 ENCOUNTER — Encounter: Payer: Self-pay | Admitting: Internal Medicine

## 2016-10-22 ENCOUNTER — Ambulatory Visit (INDEPENDENT_AMBULATORY_CARE_PROVIDER_SITE_OTHER): Payer: Medicare Other | Admitting: Internal Medicine

## 2016-10-22 VITALS — BP 118/78 | HR 73 | Temp 98.3°F | Ht 65.0 in | Wt 148.0 lb

## 2016-10-22 DIAGNOSIS — K2100 Gastro-esophageal reflux disease with esophagitis, without bleeding: Secondary | ICD-10-CM

## 2016-10-22 DIAGNOSIS — I10 Essential (primary) hypertension: Secondary | ICD-10-CM | POA: Diagnosis not present

## 2016-10-22 DIAGNOSIS — E785 Hyperlipidemia, unspecified: Secondary | ICD-10-CM

## 2016-10-22 DIAGNOSIS — Z7189 Other specified counseling: Secondary | ICD-10-CM | POA: Diagnosis not present

## 2016-10-22 DIAGNOSIS — Z0000 Encounter for general adult medical examination without abnormal findings: Secondary | ICD-10-CM | POA: Diagnosis not present

## 2016-10-22 DIAGNOSIS — Z23 Encounter for immunization: Secondary | ICD-10-CM | POA: Diagnosis not present

## 2016-10-22 LAB — COMPREHENSIVE METABOLIC PANEL
ALK PHOS: 86 U/L (ref 39–117)
ALT: 12 U/L (ref 0–35)
AST: 13 U/L (ref 0–37)
Albumin: 4.2 g/dL (ref 3.5–5.2)
BILIRUBIN TOTAL: 0.4 mg/dL (ref 0.2–1.2)
BUN: 24 mg/dL — AB (ref 6–23)
CO2: 30 mEq/L (ref 19–32)
CREATININE: 1.12 mg/dL (ref 0.40–1.20)
Calcium: 9.9 mg/dL (ref 8.4–10.5)
Chloride: 101 mEq/L (ref 96–112)
GFR: 50.84 mL/min — ABNORMAL LOW (ref >60.00)
GLUCOSE: 100 mg/dL — AB (ref 70–99)
POTASSIUM: 4 meq/L (ref 3.5–5.1)
SODIUM: 138 meq/L (ref 135–145)
TOTAL PROTEIN: 7.3 g/dL (ref 6.0–8.3)

## 2016-10-22 LAB — T4, FREE: FREE T4: 0.79 ng/dL (ref 0.60–1.60)

## 2016-10-22 LAB — CBC WITH DIFFERENTIAL/PLATELET
Basophils Absolute: 0.1 10*3/uL (ref 0.0–0.1)
Basophils Relative: 1.3 % (ref 0.0–3.0)
EOS PCT: 2.3 % (ref 0.0–5.0)
Eosinophils Absolute: 0.2 10*3/uL (ref 0.0–0.7)
HCT: 34.6 % — ABNORMAL LOW (ref 36.0–46.0)
Hemoglobin: 10.7 g/dL — ABNORMAL LOW (ref 12.0–15.0)
LYMPHS ABS: 1.9 10*3/uL (ref 0.7–4.0)
Lymphocytes Relative: 25.7 % (ref 12.0–46.0)
MCHC: 30.8 g/dL (ref 30.0–36.0)
MCV: 72.6 fl — ABNORMAL LOW (ref 78.0–100.0)
MONO ABS: 0.6 10*3/uL (ref 0.1–1.0)
Monocytes Relative: 8.7 % (ref 3.0–12.0)
NEUTROS PCT: 62 % (ref 43.0–77.0)
Neutro Abs: 4.7 10*3/uL (ref 1.4–7.7)
Platelets: 405 10*3/uL — ABNORMAL HIGH (ref 150.0–400.0)
RBC: 4.77 Mil/uL (ref 3.87–5.11)
RDW: 18.7 % — ABNORMAL HIGH (ref 11.5–15.5)
WBC: 7.5 10*3/uL (ref 4.0–10.5)

## 2016-10-22 LAB — LIPID PANEL
Cholesterol: 202 mg/dL — ABNORMAL HIGH (ref 0–200)
HDL: 39.8 mg/dL (ref >39.00)
LDL Cholesterol: 129 mg/dL — ABNORMAL HIGH (ref 0–99)
NONHDL: 162.14
Total CHOL/HDL Ratio: 5
Triglycerides: 166 mg/dL — ABNORMAL HIGH (ref 0.0–149.0)
VLDL: 33.2 mg/dL (ref 0.0–40.0)

## 2016-10-22 MED ORDER — BENZONATATE 200 MG PO CAPS: 200.0000 mg | ORAL_CAPSULE | Freq: Three times a day (TID) | ORAL | 0 refills | Status: DC | PRN

## 2016-10-22 MED ORDER — TRIAMTERENE-HCTZ 37.5-25 MG PO TABS: 0.5000 | ORAL_TABLET | Freq: Every day | ORAL | 0 refills | Status: DC

## 2016-10-22 NOTE — Assessment & Plan Note (Signed)
Takes the krill oil for this Doesn't want statin

## 2016-10-22 NOTE — Progress Notes (Signed)
Subjective:    Patient ID: Meghan Moody, female    DOB: [birth year], 71 y.o.   MRN: [id number]  HPI Here for Medicare wellness and follow up of chronic health conditions Reviewed form and advanced directives Reviewed other doctors No alcohol or tobacco Tries to walk regularly Vision is okay---needs new prescription Deaf in left ear all her life No falls Mild depressed mood--nothing persistent. Not anhedonic. Independent with instrumental ADLs No sig memory problems  She has a dry mouth Discussed the diuretic Takes the decongestant daily Cetirizine daily for hopes that it would help coughl  Doesn't check BP No chest pain--but gets some mid back pain (like when cooking and bending over) No dizziness or syncope No edema No headaches  Some chronic cough--mostly dry (tickle in throat) Will have spells if she seems to swallow wrong Takes the omeprazole every day-- very bad symptoms if she misses it (but not taking bid)  Takes the krill oil For general well being and cholesterol Still not excited about a statin  Current Outpatient Prescriptions on File Prior to Visit  Medication Sig Dispense Refill  . acetaminophen (TYLENOL) 500 MG tablet Take 500 mg by mouth every 8 (eight) hours as needed.    Marland Kitchen. aspirin 81 MG tablet Take 81 mg by mouth daily.    . carisoprodol (SOMA) 350 MG tablet TAKE 1 TABLET 3 TIMES A DAY 30 tablet 0  . cetirizine (ZYRTEC) 10 MG tablet Take 10 mg by mouth daily.    Marland Kitchen. KLOR-CON M10 10 MEQ tablet TAKE 1 TABLET (10 MEQ TOTAL) BY MOUTH DAILY. 90 tablet 3  . Multiple Vitamin (MULTIVITAMIN) tablet Take 1 tablet by mouth daily.    Marland Kitchen. omeprazole (PRILOSEC) 20 MG capsule TAKE 1 CAPSULE (20 MG TOTAL) BY MOUTH 2 (TWO) TIMES DAILY BEFORE A MEAL. 180 capsule 0  . Pseudoephedrine HCl (NASAL DECONGESTANT ADULT PO) Take by mouth as needed.    . triamterene-hydrochlorothiazide (MAXZIDE-25) 37.5-25 MG tablet TAKE 1 TABLET BY MOUTH DAILY. 90 tablet 0   No current  facility-administered medications on file prior to visit.     No Known Allergies  Past Medical History:  Diagnosis Date  . GERD (gastroesophageal reflux disease)   . Hyperlipidemia   . Hypertension     Past Surgical History:  Procedure Laterality Date  . BUNIONECTOMY    . TUBAL LIGATION      Family History  Problem Relation Age of Onset  . Coronary artery disease Mother   . Heart disease Mother   . Hypertension Mother   . Stroke Father   . Drug abuse Brother     Social History   Social History  . Marital status: Widowed    Spouse name: N/A  . Number of children: 1  . Years of education: N/A   Occupational History  . 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 Smoking status: Former Smoker    Packs/day: 0.30    Years: 40.00    Types: Cigarettes    Quit date: 04/16/2006  . Smokeless tobacco: Never Used  . Alcohol use No  . Drug use: No  . Sexual activity: Not on file   Other Topics Concern  . Not on file   Social History Narrative   Widowed, 1 son   New relationship since 2013      No living will   Not sure about health care POA-- probably niece Judeth CornfieldStephanie and son   Would accept resuscitation  attempts but no prolonged ventilation.   Would probably not accept tube feeds   Review of Systems Appetite is good Weight is stable Not a great sleeper---2 year old dog keeps her up Wears seat belt Teeth are good-- keeps up with Dr Tiburcio Pea No skin issues. No dermatologist Bowels are fine--no blood in stool No urinary problems Only has pain in disjointed right 2nd finger. Mild in other fingers No longer sees gyn    Objective:   Physical Exam  Constitutional: She is oriented to person, place, and time. She appears well-developed and well-nourished. No distress.  HENT:  Mouth/Throat: Oropharynx is clear and moist. No oropharyngeal exudate.  Neck: No thyromegaly present.  Cardiovascular: Normal rate, regular rhythm, normal heart  sounds and intact distal pulses.  Exam reveals no gallop.   No murmur heard. Pulmonary/Chest: Effort normal and breath sounds normal. No respiratory distress. She has no wheezes. She has no rales.  Abdominal: Soft. There is no tenderness.  Musculoskeletal: She exhibits no edema or tenderness.  Lymphadenopathy:    She has no cervical adenopathy.  Neurological: She is alert and oriented to person, place, and time.  President--- "Benson [place], W" 100-93-85-78-71-64 D-l-r-o-w Recall 3/3  Skin: No rash noted. No erythema.  Psychiatric: She has a normal mood and affect. Her behavior is normal.          Assessment & Plan:

## 2016-10-22 NOTE — Patient Instructions (Addendum)
Go back to twice a day on the omeprazole. Cut the blood pressure pill in half and only take half daily. Don't take the cetirizine daily---to see if that helps your dry mouth. Please set up your screening mammogram.

## 2016-10-22 NOTE — Assessment & Plan Note (Signed)
BP Readings from Last 3 Encounters:  10/22/16 118/78  10/21/15 126/70  08/31/15 120/80   Due to dry mouth, will try cutting diuretic in half

## 2016-10-22 NOTE — Assessment & Plan Note (Signed)
See social history 

## 2016-10-22 NOTE — Assessment & Plan Note (Signed)
Discussed that this is likely the reason for her chronic cough Increase PPI back to bid  Tessalon prn

## 2016-10-22 NOTE — Addendum Note (Signed)
Addended by: Eual FinesBRIDGES, Kenidee Cregan P on: 10/22/2016 11:40 AM   Modules accepted: Orders

## 2016-10-22 NOTE — Assessment & Plan Note (Signed)
I have personally reviewed the Medicare Annual Wellness questionnaire and have noted 1. The patient's medical and social history 2. Their use of alcohol, tobacco or illicit drugs 3. Their current medications and supplements 4. The patient's functional ability including ADL's, fall risks, home safety risks and hearing or visual             impairment. 5. Diet and physical activities 6. Evidence for depression or mood disorders  The patients weight, height, BMI and visual acuity have been recorded in the chart I have made referrals, counseling and provided education to the patient based review of the above and I have provided the pt with a written personalized care plan for preventive services.  I have provided you with a copy of your personalized plan for preventive services. Please take the time to review along with your updated medication list.  Will update pneumovax Yearly flu vaccine in fall FIT--[facility]---urged her to do it Discussed fitness---resistance training

## 2016-10-23 ENCOUNTER — Encounter: Payer: Self-pay | Admitting: *Deleted

## 2016-11-12 LAB — FECAL OCCULT BLOOD, GUAIAC: Fecal Occult Blood: NEGATIVE

## 2016-11-23 ENCOUNTER — Encounter: Payer: Self-pay | Admitting: Internal Medicine

## 2016-12-04 ENCOUNTER — Other Ambulatory Visit: Payer: Self-pay | Admitting: Internal Medicine

## 2016-12-09 ENCOUNTER — Other Ambulatory Visit: Payer: Self-pay | Admitting: Internal Medicine

## 2017-02-25 ENCOUNTER — Telehealth: Payer: Self-pay | Admitting: Internal Medicine

## 2017-02-25 ENCOUNTER — Other Ambulatory Visit: Payer: Self-pay

## 2017-02-25 MED ORDER — TRIAMTERENE-HCTZ 37.5-25 MG PO TABS: 0.5000 | ORAL_TABLET | Freq: Every day | ORAL | 2 refills | Status: DC

## 2017-02-25 NOTE — Telephone Encounter (Signed)
Pt is requesting a refill on triamterene HCTZ 37.5mg  / 25mg 

## 2017-02-25 NOTE — Telephone Encounter (Signed)
done

## 2017-02-25 NOTE — Telephone Encounter (Signed)
Copied from CRM [id number]. Topic: Quick Communication - See Telephone Encounter >> Feb 25, 2017  2:24 PM Waymon AmatoBurton, Donna F wrote: CRM for notification. See Telephone encounter for: pt is needing a refill on triamterene hctz 37.5 25mg    Best number [phone]  02/25/17.

## 2017-03-01 ENCOUNTER — Other Ambulatory Visit: Payer: Self-pay | Admitting: Internal Medicine

## 2017-03-01 DIAGNOSIS — Z1231 Encounter for screening mammogram for malignant neoplasm of breast: Secondary | ICD-10-CM

## 2017-03-22 ENCOUNTER — Ambulatory Visit
Admission: RE | Admit: 2017-03-22 | Discharge: 2017-03-22 | Disposition: A | Discharge status: Home / Self Care | Payer: Medicare Other | Source: Ambulatory Visit | Attending: Internal Medicine | Admitting: Internal Medicine

## 2017-03-22 DIAGNOSIS — Z1231 Encounter for screening mammogram for malignant neoplasm of breast: Secondary | ICD-10-CM | POA: Diagnosis present

## 2017-05-26 ENCOUNTER — Other Ambulatory Visit: Payer: Self-pay | Admitting: Internal Medicine

## 2017-10-28 ENCOUNTER — Ambulatory Visit (INDEPENDENT_AMBULATORY_CARE_PROVIDER_SITE_OTHER): Payer: Medicare HMO | Admitting: Internal Medicine

## 2017-10-28 ENCOUNTER — Encounter: Payer: Self-pay | Admitting: Internal Medicine

## 2017-10-28 VITALS — BP 128/84 | HR 72 | Temp 97.9°F | Ht 65.0 in | Wt 145.0 lb

## 2017-10-28 DIAGNOSIS — Z7189 Other specified counseling: Secondary | ICD-10-CM | POA: Diagnosis not present

## 2017-10-28 DIAGNOSIS — I10 Essential (primary) hypertension: Secondary | ICD-10-CM

## 2017-10-28 DIAGNOSIS — K2100 Gastro-esophageal reflux disease with esophagitis, without bleeding: Secondary | ICD-10-CM

## 2017-10-28 DIAGNOSIS — Z0000 Encounter for general adult medical examination without abnormal findings: Secondary | ICD-10-CM | POA: Diagnosis not present

## 2017-10-28 DIAGNOSIS — E785 Hyperlipidemia, unspecified: Secondary | ICD-10-CM | POA: Diagnosis not present

## 2017-10-28 DIAGNOSIS — R0789 Other chest pain: Secondary | ICD-10-CM | POA: Diagnosis not present

## 2017-10-28 DIAGNOSIS — Z1211 Encounter for screening for malignant neoplasm of colon: Secondary | ICD-10-CM

## 2017-10-28 LAB — COMPREHENSIVE METABOLIC PANEL
ALT: 15 U/L (ref 0–35)
AST: 15 U/L (ref 0–37)
Albumin: 4.4 g/dL (ref 3.5–5.2)
Alkaline Phosphatase: 89 U/L (ref 39–117)
BILIRUBIN TOTAL: 0.4 mg/dL (ref 0.2–1.2)
BUN: 21 mg/dL (ref 6–23)
CO2: 27 meq/L (ref 19–32)
CREATININE: 1.12 mg/dL (ref 0.40–1.20)
Calcium: 9.9 mg/dL (ref 8.4–10.5)
Chloride: 104 mEq/L (ref 96–112)
GFR: 50.7 mL/min — ABNORMAL LOW (ref >60.00)
GLUCOSE: 97 mg/dL (ref 70–99)
Potassium: 4.3 mEq/L (ref 3.5–5.1)
Sodium: 140 mEq/L (ref 135–145)
Total Protein: 7 g/dL (ref 6.0–8.3)

## 2017-10-28 LAB — LIPID PANEL
CHOL/HDL RATIO: 5
Cholesterol: 215 mg/dL — ABNORMAL HIGH (ref 0–200)
HDL: 47.4 mg/dL (ref >39.00)
LDL CALC: 132 mg/dL — AB (ref 0–99)
NonHDL: 167.98
TRIGLYCERIDES: 178 mg/dL — AB (ref 0.0–149.0)
VLDL: 35.6 mg/dL (ref 0.0–40.0)

## 2017-10-28 LAB — CBC
HCT: 32.1 % — ABNORMAL LOW (ref 36.0–46.0)
Hemoglobin: 10.3 g/dL — ABNORMAL LOW (ref 12.0–15.0)
MCHC: 30.1 g/dL (ref 30.0–36.0)
MCV: 70 fl — AB (ref 78.0–100.0)
Platelets: 410 10*3/uL — ABNORMAL HIGH (ref 150.0–400.0)
RBC: 4.87 Mil/uL (ref 3.87–5.11)
RDW: 20.2 % — AB (ref 11.5–15.5)
WBC: 9 10*3/uL (ref 4.0–10.5)

## 2017-10-28 LAB — T4, FREE: Free T4: 0.97 ng/dL (ref 0.60–1.60)

## 2017-10-28 MED ORDER — OMEPRAZOLE 20 MG PO CPDR: 20.0000 mg | DELAYED_RELEASE_CAPSULE | Freq: Two times a day (BID) | ORAL | 3 refills | Status: DC

## 2017-10-28 NOTE — Progress Notes (Signed)
Subjective:    Patient ID: Meghan Moody, female    DOB: [birth year], 72 y.o.   MRN: [id number]  HPI Here for Medicare wellness visit and follow up of chronic health conditions Reviewed form and advanced directives Reviewed other doctors No alcohol or tobacco Walks regularly--but not pushing it. [place] [place] [facility] [place], but too much responsibilty Needs new glasses---overdue for visit Deaf in left ear for a long time No falls Some down times--but no prolonged depression. Not anhedonic Independent with instrumental ADLs No sig memory problems  Generally feels okay Living with mom to give her care---she is on hospice  On diuretic on HTN Not as much dry mouth Stopped the cetirizine and only on 1/2 duiretic Not checking BP Will get some chest pressure with walking (like up a hill) or from stress Usually better after some deep breaths No SOB---just feels "tight" Occasional dizziness if bends over and comes back up quick---if she is out in the heat No syncope No edema  Still on krill oil for cholesterol  Takes omeprazole daily Has symptoms if misses No heartburn or dysphagia while on it  Current Outpatient Medications on File Prior to Visit  Medication Sig Dispense Refill  . acetaminophen (TYLENOL) 500 MG tablet Take 500 mg by mouth every 8 (eight) hours as needed.    Marland Kitchen aspirin 81 MG tablet Take 81 mg by mouth daily.    . carisoprodol (SOMA) 350 MG tablet TAKE 1 TABLET 3 TIMES A DAY 30 tablet 0  . KLOR-CON M10 10 MEQ tablet TAKE 1 TABLET (10 MEQ TOTAL) BY MOUTH DAILY. 90 tablet 3  . MEGARED OMEGA-3 KRILL OIL 500 MG CAPS Take 1 capsule by mouth daily.    . Multiple Vitamin (MULTIVITAMIN) tablet Take 1 tablet by mouth daily.    . Multiple Vitamins-Minerals (MULTIVITAMIN ADULT PO) Take by mouth. Energy and Metabolism formula    . omeprazole (PRILOSEC) 20 MG capsule TAKE 1 CAPSULE (20 MG TOTAL) BY MOUTH 2 (TWO) TIMES DAILY BEFORE A MEAL. 180 capsule 0  . Plant Sterols and Stanols  (CHOLESTOFF PLUS PO) Take 1 capsule by mouth daily.    . Pseudoephedrine HCl (NASAL DECONGESTANT ADULT PO) Take by mouth as needed.    . triamterene-hydrochlorothiazide (MAXZIDE-25) 37.5-25 MG tablet Take 0.5 tablets daily by mouth. 90 tablet 2   No current facility-administered medications on file prior to visit.     No Known Allergies  Past Medical History:  Diagnosis Date  . GERD (gastroesophageal reflux disease)   . Hyperlipidemia   . Hypertension     Past Surgical History:  Procedure Laterality Date  . BREAST CYST ASPIRATION Bilateral    neg  . BREAST EXCISIONAL BIOPSY Right 1978   neg  . BUNIONECTOMY    . TUBAL LIGATION      Family History  Problem Relation Age of Onset  . Coronary artery disease Mother   . Heart disease Mother   . Hypertension Mother   . Stroke Father   . Drug abuse Brother     Social History   Socioeconomic History  . Marital status: Widowed    Spouse name: Not on file  . Number of children: 1  . Years of education: Not on file  . Highest education level: Not on file  Occupational History  . 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 Financial resource strain: Not on file  . Food insecurity:    Worry: Not on file  Inability: Not on file  . Transportation needs:    Medical: Not on file    Non-medical: Not on file  Tobacco Use  . Smoking status: Former Smoker    Packs/day: 0.30    Years: 40.00    Pack years: 12.00    Types: Cigarettes    Last attempt to quit: 04/16/2006    Years since quitting: 11.5  . Smokeless tobacco: Never Used  Substance and Sexual Activity  . Alcohol use: No  . Drug use: No  . Sexual activity: Not on file  Lifestyle  . Physical activity:    Days per week: Not on file    Minutes per session: Not on file  . Stress: Not on file  Relationships  . Social connections:    Talks on phone: Not on file    Gets together: Not on file    Attends religious service: Not on file    Active  member of club or organization: Not on file    Attends meetings of clubs or organizations: Not on file    Relationship status: Not on file  . Intimate partner violence:    Fear of current or ex partner: Not on file    Emotionally abused: Not on file    Physically abused: Not on file    Forced sexual activity: Not on file  Other Topics Concern  . Not on file  Social History Narrative   Widowed, 1 son   New relationship since 2013      No living will   Not sure about health care POA-- probably niece Judeth CornfieldStephanie and son   Would accept resuscitation attempts but no prolonged ventilation.   Would probably not accept tube feeds   Review of Systems Appetite is fine Weight stable Sleeps okay Wears seat belt Teeth okay--due to see dentist No skin problems or lesions Some constipation----uses senna 1 daily Voids okay Some back pain-- if she reaches the wrong way. Takes soma prn if really bad No other joint problems    Objective:   Physical Exam  Constitutional: She is oriented to person, place, and time. She appears well-developed. No distress.  HENT:  Mouth/Throat: Oropharynx is clear and moist. No oropharyngeal exudate.  Neck: No thyromegaly present.  Cardiovascular: Normal rate, regular rhythm, normal heart sounds and intact distal pulses. Exam reveals no gallop.  No murmur heard. Respiratory: Effort normal and breath sounds normal. No respiratory distress. She has no wheezes. She has no rales.  GI: Soft. There is no tenderness.  Musculoskeletal: She exhibits no edema or tenderness.  Lymphadenopathy:    She has no cervical adenopathy.  Neurological: She is alert and oriented to person, place, and time.  President---- "Benson [place]ump, Obama, Bush" 100-93-87-80-73-67 D-l-r-o-w Recall 3/3  Skin: No rash noted. No erythema.  Psychiatric: She has a normal mood and affect. Her behavior is normal.           Assessment & Plan:

## 2017-10-28 NOTE — Assessment & Plan Note (Signed)
See social history 

## 2017-10-28 NOTE — Progress Notes (Signed)
Hearing Screening   Method: Audiometry   125Hz  250Hz  500Hz  1000Hz  2000Hz  3000Hz  4000Hz  6000Hz  8000Hz   Right ear:   20 20 20  20     Left ear:   0 0 0  0      Visual Acuity Screening   Right eye Left eye Both eyes  Without correction:     With correction: 20/25 20/25 20/25

## 2017-10-28 NOTE — Assessment & Plan Note (Signed)
Okay on PPI Hasn't tolerated decreased doses

## 2017-10-28 NOTE — Assessment & Plan Note (Signed)
Krill oil only unless has CAD

## 2017-10-28 NOTE — Assessment & Plan Note (Signed)
BP Readings from Last 3 Encounters:  10/28/17 128/84  10/22/16 118/78  10/21/15 126/70   Good control Consider change if has CAD

## 2017-10-28 NOTE — Assessment & Plan Note (Signed)
I have personally reviewed the Medicare Annual Wellness questionnaire and have noted 1. The patient's medical and social history 2. Their use of alcohol, tobacco or illicit drugs 3. Their current medications and supplements 4. The patient's functional ability including ADL's, fall risks, home safety risks and hearing or visual             impairment. 5. Diet and physical activities 6. Evidence for depression or mood disorders  The patients weight, height, BMI and visual acuity have been recorded in the chart I have made referrals, counseling and provided education to the patient based review of the above and I have provided the pt with a written personalized care plan for preventive services.  I have provided you with a copy of your personalized plan for preventive services. Please take the time to review along with your updated medication list.  Yearly flu vaccine FIT  mammo due 12/20 Limited exercise due to caregiving Consider shingrix when available

## 2017-10-28 NOTE — Assessment & Plan Note (Addendum)
Exertional and sounds like stable angina Started not that long ago EKG shows sinus at 64. Normal axis and intervals. No ischemic changes. No change since 08/31/15 Treadmill test looked good 2 years ago--will only retest if symptoms change

## 2017-11-29 ENCOUNTER — Other Ambulatory Visit: Payer: Self-pay | Admitting: Internal Medicine

## 2018-01-23 DIAGNOSIS — R69 Illness, unspecified: Secondary | ICD-10-CM | POA: Diagnosis not present

## 2018-10-30 ENCOUNTER — Encounter: Payer: BC Managed Care – PPO | Admitting: Internal Medicine

## 2018-12-11 ENCOUNTER — Telehealth: Payer: Self-pay

## 2018-12-11 NOTE — Telephone Encounter (Addendum)
Pt got automated call ? About flu shot and does not know if the call was for her mom. Pt did not think the call was for her. I checked pts mother chart Also Hunt Oris.

## 2019-01-14 ENCOUNTER — Other Ambulatory Visit: Payer: Self-pay | Admitting: Internal Medicine

## 2019-01-28 DIAGNOSIS — H2513 Age-related nuclear cataract, bilateral: Secondary | ICD-10-CM | POA: Diagnosis not present

## 2019-03-19 DIAGNOSIS — H2511 Age-related nuclear cataract, right eye: Secondary | ICD-10-CM | POA: Diagnosis not present

## 2019-03-26 ENCOUNTER — Other Ambulatory Visit
Admission: RE | Admit: 2019-03-26 | Discharge: 2019-03-26 | Disposition: A | Discharge status: Home / Self Care | Payer: Medicare HMO | Source: Ambulatory Visit | Attending: Ophthalmology | Admitting: Ophthalmology

## 2019-03-26 DIAGNOSIS — Z20828 Contact with and (suspected) exposure to other viral communicable diseases: Secondary | ICD-10-CM | POA: Diagnosis not present

## 2019-03-26 DIAGNOSIS — Z01812 Encounter for preprocedural laboratory examination: Secondary | ICD-10-CM | POA: Insufficient documentation

## 2019-03-26 NOTE — Anesthesia Preprocedure Evaluation (Addendum)
Anesthesia Evaluation  Patient identified by MRN, date of birth, ID band Patient awake    Reviewed: Allergy & Precautions, NPO status , Patient's Chart, lab work & pertinent test results  History of Anesthesia Complications Negative for: history of anesthetic complications  Airway Mallampati: III  TM Distance: >3 FB Neck ROM: Full    Dental   Pulmonary former smoker,   Chronic bronchitis   breath sounds clear to auscultation       Cardiovascular hypertension, (-) angina(-) DOE  Rhythm:Regular Rate:Normal   HLD   Neuro/Psych    GI/Hepatic hiatal hernia, GERD  Controlled,  Endo/Other    Renal/GU      Musculoskeletal   Abdominal   Peds  Hematology  (+) anemia ,   Anesthesia Other Findings   Reproductive/Obstetrics                            Anesthesia Physical Anesthesia Plan  ASA: II  Anesthesia Plan: MAC   Post-op Pain Management:    Induction: Intravenous  PONV Risk Score and Plan: 2 and TIVA, Midazolam and Treatment may vary due to age or medical condition  Airway Management Planned: Nasal Cannula  Additional Equipment:   Intra-op Plan:   Post-operative Plan:   Informed Consent: I have reviewed the patients History and Physical, chart, labs and discussed the procedure including the risks, benefits and alternatives for the proposed anesthesia with the patient or authorized representative who has indicated his/her understanding and acceptance.       Plan Discussed with: CRNA and Anesthesiologist  Anesthesia Plan Comments:         Anesthesia Quick Evaluation

## 2019-03-26 NOTE — Discharge Instructions (Signed)

## 2019-03-27 LAB — SARS CORONAVIRUS 2 (TAT 6-24 HRS): SARS Coronavirus 2: NEGATIVE

## 2019-03-30 ENCOUNTER — Ambulatory Visit: Payer: Medicare HMO | Admitting: Anesthesiology

## 2019-03-30 ENCOUNTER — Other Ambulatory Visit: Payer: Self-pay

## 2019-03-30 ENCOUNTER — Ambulatory Visit
Admission: RE | Admit: 2019-03-30 | Discharge: 2019-03-30 | Disposition: A | Discharge status: Home / Self Care | Payer: Medicare HMO | Source: Ambulatory Visit | Attending: Ophthalmology | Admitting: Ophthalmology

## 2019-03-30 ENCOUNTER — Encounter: Payer: Self-pay | Admitting: Ophthalmology

## 2019-03-30 ENCOUNTER — Encounter
Admission: RE | Disposition: A | Discharge status: Home / Self Care | Payer: Self-pay | Source: Ambulatory Visit | Attending: Ophthalmology

## 2019-03-30 DIAGNOSIS — Z87891 Personal history of nicotine dependence: Secondary | ICD-10-CM | POA: Diagnosis not present

## 2019-03-30 DIAGNOSIS — Z79899 Other long term (current) drug therapy: Secondary | ICD-10-CM | POA: Diagnosis not present

## 2019-03-30 DIAGNOSIS — H2511 Age-related nuclear cataract, right eye: Secondary | ICD-10-CM | POA: Insufficient documentation

## 2019-03-30 DIAGNOSIS — K219 Gastro-esophageal reflux disease without esophagitis: Secondary | ICD-10-CM | POA: Diagnosis not present

## 2019-03-30 DIAGNOSIS — H9192 Unspecified hearing loss, left ear: Secondary | ICD-10-CM | POA: Insufficient documentation

## 2019-03-30 DIAGNOSIS — I10 Essential (primary) hypertension: Secondary | ICD-10-CM | POA: Insufficient documentation

## 2019-03-30 DIAGNOSIS — Z7982 Long term (current) use of aspirin: Secondary | ICD-10-CM | POA: Diagnosis not present

## 2019-03-30 DIAGNOSIS — H25811 Combined forms of age-related cataract, right eye: Secondary | ICD-10-CM | POA: Diagnosis not present

## 2019-03-30 HISTORY — DX: Chronic cough: R05.3

## 2019-03-30 HISTORY — DX: Unspecified chronic bronchitis: J42

## 2019-03-30 HISTORY — DX: Unspecified hearing loss, left ear: H91.92

## 2019-03-30 HISTORY — DX: Anemia, unspecified: D64.9

## 2019-03-30 HISTORY — DX: Shortness of breath: R06.02

## 2019-03-30 HISTORY — DX: Diaphragmatic hernia without obstruction or gangrene: K44.9

## 2019-03-30 HISTORY — PX: CATARACT EXTRACTION W/PHACO: SHX586

## 2019-03-30 SURGERY — PHACOEMULSIFICATION, CATARACT, WITH IOL INSERTION
Anesthesia: Monitor Anesthesia Care | Site: Eye | Laterality: Right

## 2019-03-30 MED ORDER — SODIUM HYALURONATE 10 MG/ML IO SOLN
INTRAOCULAR | Status: DC | PRN
  Administered 2019-03-30: 0.55 mL via INTRAOCULAR

## 2019-03-30 MED ORDER — MOXIFLOXACIN HCL 0.5 % OP SOLN
OPHTHALMIC | Status: DC | PRN
  Administered 2019-03-30: 0.2 mL via OPHTHALMIC

## 2019-03-30 MED ORDER — SODIUM HYALURONATE 23 MG/ML IO SOLN
INTRAOCULAR | Status: DC | PRN
  Administered 2019-03-30: 0.6 mL via INTRAOCULAR

## 2019-03-30 MED ORDER — MIDAZOLAM HCL 2 MG/2ML IJ SOLN
INTRAMUSCULAR | Status: DC | PRN
  Administered 2019-03-30: .5 mg via INTRAVENOUS
  Administered 2019-03-30: 1 mg via INTRAVENOUS

## 2019-03-30 MED ORDER — EPINEPHRINE PF 1 MG/ML IJ SOLN
INTRAOCULAR | Status: DC | PRN
  Administered 2019-03-30: 58 mL via OPHTHALMIC

## 2019-03-30 MED ORDER — TETRACAINE HCL 0.5 % OP SOLN
1.0000 [drp] | OPHTHALMIC | Status: DC | PRN
  Administered 2019-03-30 (×3): 1 [drp] via OPHTHALMIC

## 2019-03-30 MED ORDER — ACETAMINOPHEN 10 MG/ML IV SOLN: 1000.0000 mg | Freq: Once | INTRAVENOUS | Status: DC | PRN

## 2019-03-30 MED ORDER — ONDANSETRON HCL 4 MG/2ML IJ SOLN: 4.0000 mg | Freq: Once | INTRAMUSCULAR | Status: DC | PRN

## 2019-03-30 MED ORDER — ARMC OPHTHALMIC DILATING DROPS
1.0000 "application " | OPHTHALMIC | Status: DC | PRN
  Administered 2019-03-30 (×3): 1 via OPHTHALMIC

## 2019-03-30 MED ORDER — LIDOCAINE HCL (PF) 2 % IJ SOLN
INTRAOCULAR | Status: DC | PRN
  Administered 2019-03-30: 2 mL via INTRAOCULAR

## 2019-03-30 MED ORDER — FENTANYL CITRATE (PF) 100 MCG/2ML IJ SOLN
INTRAMUSCULAR | Status: DC | PRN
  Administered 2019-03-30: 50 ug via INTRAVENOUS

## 2019-03-30 MED ORDER — LACTATED RINGERS IV SOLN: 100.0000 mL/h | INTRAVENOUS | Status: DC

## 2019-03-30 SURGICAL SUPPLY — 19 items
CANNULA ANT/CHMB 27G (MISCELLANEOUS) ×2 IMPLANT
CANNULA ANT/CHMB 27GA (MISCELLANEOUS) ×4 IMPLANT
DISSECTOR HYDRO NUCLEUS 50X22 (MISCELLANEOUS) ×2 IMPLANT
GLOVE SURG LX 7.5 STRW (GLOVE) ×1
GLOVE SURG LX STRL 7.5 STRW (GLOVE) ×1 IMPLANT
GLOVE SURG SYN 8.5  E (GLOVE) ×1
GLOVE SURG SYN 8.5 E (GLOVE) ×1 IMPLANT
GLOVE SURG SYN 8.5 PF PI (GLOVE) ×1 IMPLANT
GOWN STRL REUS W/ TWL LRG LVL3 (GOWN DISPOSABLE) ×2 IMPLANT
GOWN STRL REUS W/TWL LRG LVL3 (GOWN DISPOSABLE) ×4
LENS IOL TECNIS ITEC 23.5 (Intraocular Lens) ×1 IMPLANT
MARKER SKIN DUAL TIP RULER LAB (MISCELLANEOUS) ×2 IMPLANT
PACK DR. KING ARMS (PACKS) ×2 IMPLANT
PACK EYE AFTER SURG (MISCELLANEOUS) ×2 IMPLANT
PACK OPTHALMIC (MISCELLANEOUS) ×2 IMPLANT
SYR 3ML LL SCALE MARK (SYRINGE) ×2 IMPLANT
SYR TB 1ML LUER SLIP (SYRINGE) ×2 IMPLANT
WATER STERILE IRR 250ML POUR (IV SOLUTION) ×2 IMPLANT
WIPE NON LINTING 3.25X3.25 (MISCELLANEOUS) ×2 IMPLANT

## 2019-03-30 NOTE — Anesthesia Procedure Notes (Signed)
Procedure Name: MAC Performed by: Nunzio Banet, CRNA Pre-anesthesia Checklist: Patient identified, Emergency Drugs available, Suction available, Timeout performed and Patient being monitored Patient Re-evaluated:Patient Re-evaluated prior to induction Oxygen Delivery Method: Nasal cannula Placement Confirmation: positive ETCO2       

## 2019-03-30 NOTE — H&P (Signed)

## 2019-03-30 NOTE — Anesthesia Postprocedure Evaluation (Signed)
Anesthesia Post Note  Patient: Meghan Moody  Procedure(s) Performed: CATARACT EXTRACTION PHACO AND INTRAOCULAR LENS PLACEMENT (IOC) RIGHT, 1.23, 00:20.8 (Right Eye)     Patient location during evaluation: PACU Anesthesia Type: MAC Level of consciousness: awake and alert Pain management: pain level controlled Vital Signs Assessment: post-procedure vital signs reviewed and stable Respiratory status: spontaneous breathing, nonlabored ventilation, respiratory function stable and patient connected to nasal cannula oxygen Cardiovascular status: stable and blood pressure returned to baseline Postop Assessment: no apparent nausea or vomiting Anesthetic complications: no    Yoseph Haile A  Koby Hartfield

## 2019-03-30 NOTE — Op Note (Signed)
OPERATIVE NOTE  LAVENDER STANKE [id number] 03/30/2019   PREOPERATIVE DIAGNOSIS:  Nuclear sclerotic cataract right eye.  H25.11   POSTOPERATIVE DIAGNOSIS:    Nuclear sclerotic cataract right eye.     PROCEDURE:  Phacoemusification with posterior chamber intraocular lens placement of the right eye   LENS:   Implant Name Type Inv. Item Serial No. Manufacturer Lot No. LRB No. Used Action  LENS IOL DIOP 23.5 - [id number] Intraocular Lens LENS IOL DIOP 23.5 [id number] AMO  Right 1 Implanted       Procedure(s): CATARACT EXTRACTION PHACO AND INTRAOCULAR LENS PLACEMENT (IOC) RIGHT, 1.23, 00:20.8 (Right)  PCB00 +23.5   ULTRASOUND TIME: 0 minutes 20 seconds.  CDE 1.23   SURGEON:  Benay Pillow, MD, MPH  ANESTHESIOLOGIST: Anesthesiologist: Heniser, Fredric Dine, MD CRNA: Vanetta Shawl, CRNA   ANESTHESIA:  Topical with tetracaine drops augmented with 1% preservative-free intracameral lidocaine.  ESTIMATED BLOOD LOSS: less than 1 mL.   COMPLICATIONS:  None.   DESCRIPTION OF PROCEDURE:  The patient was identified in the holding room and transported to the operating room and placed in the supine position under the operating microscope.  The right eye was identified as the operative eye and it was prepped and draped in the usual sterile ophthalmic fashion.   A 1.0 millimeter clear-corneal paracentesis was made at the 10:30 position. 0.5 ml of preservative-free 1% lidocaine with epinephrine was injected into the anterior chamber.  The anterior chamber was filled with Healon 5 viscoelastic.  A 2.4 millimeter keratome was used to make a near-clear corneal incision at the 8:00 position.  A curvilinear capsulorrhexis was made with a cystotome and capsulorrhexis forceps.  Balanced salt solution was used to hydrodissect and hydrodelineate the nucleus.   Phacoemulsification was then used in stop and chop fashion to remove the lens nucleus and epinucleus.  The remaining cortex was then removed using the  irrigation and aspiration handpiece. Healon was then placed into the capsular bag to distend it for lens placement.  A lens was then injected into the capsular bag.  The remaining viscoelastic was aspirated.   Wounds were hydrated with balanced salt solution.  The anterior chamber was inflated to a physiologic pressure with balanced salt solution.   Intracameral vigamox 0.1 mL undiluted was injected into the eye and a drop placed onto the ocular surface.  No wound leaks were noted.  The patient was taken to the recovery room in stable condition without complications of anesthesia or surgery  Benay Pillow 03/30/2019, 9:18 AM

## 2019-03-30 NOTE — Transfer of Care (Signed)
Immediate Anesthesia Transfer of Care Note  Patient: Meghan Moody  Procedure(s) Performed: CATARACT EXTRACTION PHACO AND INTRAOCULAR LENS PLACEMENT (IOC) RIGHT, 1.23, 00:20.8 (Right Eye)  Patient Location: PACU  Anesthesia Type: MAC  Level of Consciousness: awake, alert  and patient cooperative  Airway and Oxygen Therapy: Patient Spontanous Breathing and Patient connected to supplemental oxygen  Post-op Assessment: Post-op Vital signs reviewed, Patient's Cardiovascular Status Stable, Respiratory Function Stable, Patent Airway and No signs of Nausea or vomiting  Post-op Vital Signs: Reviewed and stable  Complications: No apparent anesthesia complications

## 2019-03-31 ENCOUNTER — Encounter: Payer: Self-pay | Admitting: *Deleted

## 2019-04-15 DIAGNOSIS — H2512 Age-related nuclear cataract, left eye: Secondary | ICD-10-CM | POA: Diagnosis not present

## 2019-04-15 DIAGNOSIS — I10 Essential (primary) hypertension: Secondary | ICD-10-CM | POA: Diagnosis not present

## 2019-04-21 ENCOUNTER — Encounter: Payer: Self-pay | Admitting: Ophthalmology

## 2019-04-21 ENCOUNTER — Other Ambulatory Visit: Payer: Self-pay

## 2019-04-23 ENCOUNTER — Other Ambulatory Visit: Payer: Self-pay

## 2019-04-23 ENCOUNTER — Other Ambulatory Visit
Admission: RE | Admit: 2019-04-23 | Discharge: 2019-04-23 | Disposition: A | Discharge status: Home / Self Care | Payer: Medicare HMO | Source: Ambulatory Visit | Attending: Ophthalmology | Admitting: Ophthalmology

## 2019-04-23 DIAGNOSIS — Z01812 Encounter for preprocedural laboratory examination: Secondary | ICD-10-CM | POA: Insufficient documentation

## 2019-04-23 DIAGNOSIS — Z20822 Contact with and (suspected) exposure to covid-19: Secondary | ICD-10-CM | POA: Insufficient documentation

## 2019-04-23 NOTE — Discharge Instructions (Signed)

## 2019-04-24 LAB — SARS CORONAVIRUS 2 (TAT 6-24 HRS): SARS Coronavirus 2: NEGATIVE

## 2019-04-27 ENCOUNTER — Encounter: Payer: Self-pay | Admitting: Ophthalmology

## 2019-04-27 ENCOUNTER — Ambulatory Visit
Admission: RE | Admit: 2019-04-27 | Discharge: 2019-04-27 | Disposition: A | Discharge status: Home / Self Care | Payer: Medicare HMO | Attending: Ophthalmology | Admitting: Ophthalmology

## 2019-04-27 ENCOUNTER — Ambulatory Visit: Payer: Medicare HMO | Admitting: Anesthesiology

## 2019-04-27 ENCOUNTER — Other Ambulatory Visit: Payer: Self-pay

## 2019-04-27 ENCOUNTER — Encounter
Admission: RE | Disposition: A | Discharge status: Home / Self Care | Payer: Self-pay | Source: Home / Self Care | Attending: Ophthalmology

## 2019-04-27 DIAGNOSIS — Z79899 Other long term (current) drug therapy: Secondary | ICD-10-CM | POA: Diagnosis not present

## 2019-04-27 DIAGNOSIS — H9192 Unspecified hearing loss, left ear: Secondary | ICD-10-CM | POA: Diagnosis not present

## 2019-04-27 DIAGNOSIS — K219 Gastro-esophageal reflux disease without esophagitis: Secondary | ICD-10-CM | POA: Insufficient documentation

## 2019-04-27 DIAGNOSIS — I10 Essential (primary) hypertension: Secondary | ICD-10-CM | POA: Insufficient documentation

## 2019-04-27 DIAGNOSIS — J449 Chronic obstructive pulmonary disease, unspecified: Secondary | ICD-10-CM | POA: Insufficient documentation

## 2019-04-27 DIAGNOSIS — H25812 Combined forms of age-related cataract, left eye: Secondary | ICD-10-CM | POA: Diagnosis not present

## 2019-04-27 DIAGNOSIS — K449 Diaphragmatic hernia without obstruction or gangrene: Secondary | ICD-10-CM | POA: Insufficient documentation

## 2019-04-27 DIAGNOSIS — Z87891 Personal history of nicotine dependence: Secondary | ICD-10-CM | POA: Insufficient documentation

## 2019-04-27 DIAGNOSIS — H2512 Age-related nuclear cataract, left eye: Secondary | ICD-10-CM | POA: Diagnosis not present

## 2019-04-27 HISTORY — PX: CATARACT EXTRACTION W/PHACO: SHX586

## 2019-04-27 SURGERY — PHACOEMULSIFICATION, CATARACT, WITH IOL INSERTION
Anesthesia: Monitor Anesthesia Care | Site: Eye | Laterality: Left

## 2019-04-27 MED ORDER — ARMC OPHTHALMIC DILATING DROPS
1.0000 "application " | OPHTHALMIC | Status: DC | PRN
  Administered 2019-04-27 (×3): 1 via OPHTHALMIC

## 2019-04-27 MED ORDER — MOXIFLOXACIN HCL 0.5 % OP SOLN
OPHTHALMIC | Status: DC | PRN
  Administered 2019-04-27: 0.2 mL via OPHTHALMIC

## 2019-04-27 MED ORDER — SODIUM HYALURONATE 23 MG/ML IO SOLN
INTRAOCULAR | Status: DC | PRN
  Administered 2019-04-27: 0.6 mL via INTRAOCULAR

## 2019-04-27 MED ORDER — FENTANYL CITRATE (PF) 100 MCG/2ML IJ SOLN
INTRAMUSCULAR | Status: DC | PRN
  Administered 2019-04-27: 50 ug via INTRAVENOUS

## 2019-04-27 MED ORDER — TETRACAINE HCL 0.5 % OP SOLN
1.0000 [drp] | OPHTHALMIC | Status: DC | PRN
  Administered 2019-04-27 (×3): 1 [drp] via OPHTHALMIC

## 2019-04-27 MED ORDER — MIDAZOLAM HCL 2 MG/2ML IJ SOLN
INTRAMUSCULAR | Status: DC | PRN
  Administered 2019-04-27: 1 mg via INTRAVENOUS
  Administered 2019-04-27: .5 mg via INTRAVENOUS

## 2019-04-27 MED ORDER — EPINEPHRINE PF 1 MG/ML IJ SOLN
INTRAOCULAR | Status: DC | PRN
  Administered 2019-04-27: 58 mL via OPHTHALMIC

## 2019-04-27 MED ORDER — SODIUM HYALURONATE 10 MG/ML IO SOLN
INTRAOCULAR | Status: DC | PRN
  Administered 2019-04-27: 0.55 mL via INTRAOCULAR

## 2019-04-27 MED ORDER — LIDOCAINE HCL (PF) 2 % IJ SOLN
INTRAOCULAR | Status: DC | PRN
  Administered 2019-04-27: 1 mL via INTRAOCULAR

## 2019-04-27 SURGICAL SUPPLY — 19 items
CANNULA ANT/CHMB 27G (MISCELLANEOUS) ×2 IMPLANT
CANNULA ANT/CHMB 27GA (MISCELLANEOUS) ×4 IMPLANT
DISSECTOR HYDRO NUCLEUS 50X22 (MISCELLANEOUS) ×2 IMPLANT
GLOVE SURG LX 7.5 STRW (GLOVE) ×1
GLOVE SURG LX STRL 7.5 STRW (GLOVE) ×1 IMPLANT
GLOVE SURG SYN 8.5  E (GLOVE) ×1
GLOVE SURG SYN 8.5 E (GLOVE) ×1 IMPLANT
GLOVE SURG SYN 8.5 PF PI (GLOVE) ×1 IMPLANT
GOWN STRL REUS W/ TWL LRG LVL3 (GOWN DISPOSABLE) ×2 IMPLANT
GOWN STRL REUS W/TWL LRG LVL3 (GOWN DISPOSABLE) ×4
LENS IOL TECNIS ITEC 22.0 (Intraocular Lens) ×1 IMPLANT
MARKER SKIN DUAL TIP RULER LAB (MISCELLANEOUS) ×2 IMPLANT
PACK DR. KING ARMS (PACKS) ×2 IMPLANT
PACK EYE AFTER SURG (MISCELLANEOUS) ×2 IMPLANT
PACK OPTHALMIC (MISCELLANEOUS) ×2 IMPLANT
SYR 3ML LL SCALE MARK (SYRINGE) ×2 IMPLANT
SYR TB 1ML LUER SLIP (SYRINGE) ×2 IMPLANT
WATER STERILE IRR 250ML POUR (IV SOLUTION) ×2 IMPLANT
WIPE NON LINTING 3.25X3.25 (MISCELLANEOUS) ×2 IMPLANT

## 2019-04-27 NOTE — Op Note (Signed)
OPERATIVE NOTE  Meghan Moody [id number] 04/27/2019   PREOPERATIVE DIAGNOSIS:  Nuclear sclerotic cataract left eye.  H25.12   POSTOPERATIVE DIAGNOSIS:    Nuclear sclerotic cataract left eye.     PROCEDURE:  Phacoemusification with posterior chamber intraocular lens placement of the left eye   LENS:   Implant Name Type Inv. Item Serial No. Manufacturer Lot No. LRB No. Used Action  LENS IOL DIOP 22.0 - [id number] Intraocular Lens LENS IOL DIOP 22.0 [id number] AMO  Left 1 Implanted      Procedure(s): CATARACT EXTRACTION PHACO AND INTRAOCULAR LENS PLACEMENT (IOC) LEFT 1.46  00:20.3 (Left)  PCB00 +22.0   ULTRASOUND TIME: 0 minutes 20 seconds.  CDE 1.46   SURGEON:  Willey Blade, MD, MPH   ANESTHESIA:  Topical with tetracaine drops augmented with 1% preservative-free intracameral lidocaine.  ESTIMATED BLOOD LOSS: <1 mL   COMPLICATIONS:  None.   DESCRIPTION OF PROCEDURE:  The patient was identified in the holding room and transported to the operating room and placed in the supine position under the operating microscope.  The left eye was identified as the operative eye and it was prepped and draped in the usual sterile ophthalmic fashion.   A 1.0 millimeter clear-corneal paracentesis was made at the 5:00 position. 0.5 ml of preservative-free 1% lidocaine with epinephrine was injected into the anterior chamber.  The anterior chamber was filled with Healon 5 viscoelastic.  A 2.4 millimeter keratome was used to make a near-clear corneal incision at the 2:00 position.  A curvilinear capsulorrhexis was made with a cystotome and capsulorrhexis forceps.  Balanced salt solution was used to hydrodissect and hydrodelineate the nucleus.   Phacoemulsification was then used in stop and chop fashion to remove the lens nucleus and epinucleus.  The remaining cortex was then removed using the irrigation and aspiration handpiece. Healon was then placed into the capsular bag to distend it for lens  placement.  A lens was then injected into the capsular bag.  The remaining viscoelastic was aspirated.   Wounds were hydrated with balanced salt solution.  The anterior chamber was inflated to a physiologic pressure with balanced salt solution.  Intracameral vigamox 0.1 mL undiltued was injected into the eye and a drop placed onto the ocular surface.  No wound leaks were noted.  The patient was taken to the recovery room in stable condition without complications of anesthesia or surgery  Willey Blade 04/27/2019, 11:05 AM

## 2019-04-27 NOTE — H&P (Signed)

## 2019-04-27 NOTE — Anesthesia Procedure Notes (Addendum)
Procedure Name: MAC Date/Time: 04/27/2019 10:49 AM Performed by: Vanetta Shawl, CRNA Pre-anesthesia Checklist: Patient identified, Emergency Drugs available, Suction available, Timeout performed and Patient being monitored Patient Re-evaluated:Patient Re-evaluated prior to induction Oxygen Delivery Method: Nasal cannula Placement Confirmation: positive ETCO2

## 2019-04-27 NOTE — Anesthesia Postprocedure Evaluation (Signed)
Anesthesia Post Note  Patient: SINDIA KOWALCZYK  Procedure(s) Performed: CATARACT EXTRACTION PHACO AND INTRAOCULAR LENS PLACEMENT (IOC) LEFT 1.46  00:20.3 (Left Eye)     Patient location during evaluation: PACU Anesthesia Type: MAC Level of consciousness: awake and alert Pain management: pain level controlled Vital Signs Assessment: post-procedure vital signs reviewed and stable Respiratory status: spontaneous breathing, nonlabored ventilation, respiratory function stable and patient connected to nasal cannula oxygen Cardiovascular status: stable and blood pressure returned to baseline Postop Assessment: no apparent nausea or vomiting Anesthetic complications: no    Alisa Graff

## 2019-04-27 NOTE — Anesthesia Preprocedure Evaluation (Signed)
Anesthesia Evaluation  Patient identified by MRN, date of birth, ID band Patient awake    Reviewed: Allergy & Precautions, H&P , NPO status , Patient's Chart, lab work & pertinent test results, reviewed documented beta blocker date and time   Airway Mallampati: II  TM Distance: >3 FB Neck ROM: full    Dental no notable dental hx.    Pulmonary COPD (chronic bronchitis), former smoker,    Pulmonary exam normal breath sounds clear to auscultation       Cardiovascular Exercise Tolerance: Good hypertension,  Rhythm:regular Rate:Normal     Neuro/Psych negative neurological ROS  negative psych ROS   GI/Hepatic Neg liver ROS, hiatal hernia, GERD  ,  Endo/Other  negative endocrine ROS  Renal/GU negative Renal ROS  negative genitourinary   Musculoskeletal   Abdominal   Peds  Hematology negative hematology ROS (+)   Anesthesia Other Findings   Reproductive/Obstetrics negative OB ROS                             Anesthesia Physical Anesthesia Plan  ASA: II  Anesthesia Plan: MAC   Post-op Pain Management:    Induction:   PONV Risk Score and Plan: 2 and Treatment may vary due to age or medical condition  Airway Management Planned:   Additional Equipment:   Intra-op Plan:   Post-operative Plan:   Informed Consent: I have reviewed the patients History and Physical, chart, labs and discussed the procedure including the risks, benefits and alternatives for the proposed anesthesia with the patient or authorized representative who has indicated his/her understanding and acceptance.     Dental Advisory Given  Plan Discussed with: CRNA  Anesthesia Plan Comments:         Anesthesia Quick Evaluation

## 2019-04-27 NOTE — Transfer of Care (Signed)
Immediate Anesthesia Transfer of Care Note  Patient: Meghan Moody  Procedure(s) Performed: CATARACT EXTRACTION PHACO AND INTRAOCULAR LENS PLACEMENT (IOC) LEFT 1.46  00:20.3 (Left Eye)  Patient Location: PACU  Anesthesia Type: MAC  Level of Consciousness: awake, alert  and patient cooperative  Airway and Oxygen Therapy: Patient Spontanous Breathing and Patient connected to supplemental oxygen  Post-op Assessment: Post-op Vital signs reviewed, Patient's Cardiovascular Status Stable, Respiratory Function Stable, Patent Airway and No signs of Nausea or vomiting  Post-op Vital Signs: Reviewed and stable  Complications: No apparent anesthesia complications

## 2019-04-28 ENCOUNTER — Encounter: Payer: Self-pay | Admitting: *Deleted

## 2019-06-11 DIAGNOSIS — Z961 Presence of intraocular lens: Secondary | ICD-10-CM | POA: Diagnosis not present

## 2019-08-07 ENCOUNTER — Encounter: Payer: Self-pay | Admitting: Internal Medicine

## 2019-08-07 ENCOUNTER — Telehealth: Payer: Self-pay

## 2019-08-07 ENCOUNTER — Telehealth: Payer: Self-pay | Admitting: Family Medicine

## 2019-08-07 ENCOUNTER — Other Ambulatory Visit: Payer: Self-pay

## 2019-08-07 ENCOUNTER — Ambulatory Visit (INDEPENDENT_AMBULATORY_CARE_PROVIDER_SITE_OTHER): Payer: Medicare HMO | Admitting: Internal Medicine

## 2019-08-07 VITALS — BP 116/74 | HR 72 | Temp 97.6°F | Ht 65.0 in | Wt 132.0 lb

## 2019-08-07 DIAGNOSIS — D649 Anemia, unspecified: Secondary | ICD-10-CM

## 2019-08-07 DIAGNOSIS — Z0000 Encounter for general adult medical examination without abnormal findings: Secondary | ICD-10-CM | POA: Diagnosis not present

## 2019-08-07 DIAGNOSIS — E785 Hyperlipidemia, unspecified: Secondary | ICD-10-CM | POA: Diagnosis not present

## 2019-08-07 DIAGNOSIS — I10 Essential (primary) hypertension: Secondary | ICD-10-CM

## 2019-08-07 DIAGNOSIS — R0789 Other chest pain: Secondary | ICD-10-CM | POA: Diagnosis not present

## 2019-08-07 DIAGNOSIS — F39 Unspecified mood [affective] disorder: Secondary | ICD-10-CM

## 2019-08-07 DIAGNOSIS — Z1211 Encounter for screening for malignant neoplasm of colon: Secondary | ICD-10-CM

## 2019-08-07 DIAGNOSIS — R69 Illness, unspecified: Secondary | ICD-10-CM | POA: Diagnosis not present

## 2019-08-07 DIAGNOSIS — K219 Gastro-esophageal reflux disease without esophagitis: Secondary | ICD-10-CM | POA: Diagnosis not present

## 2019-08-07 LAB — COMPREHENSIVE METABOLIC PANEL
ALT: 9 U/L (ref 0–35)
AST: 11 U/L (ref 0–37)
Albumin: 4.4 g/dL (ref 3.5–5.2)
Alkaline Phosphatase: 84 U/L (ref 39–117)
BUN: 19 mg/dL (ref 6–23)
CO2: 26 mEq/L (ref 19–32)
Calcium: 9.8 mg/dL (ref 8.4–10.5)
Chloride: 106 mEq/L (ref 96–112)
Creatinine, Ser: 1.23 mg/dL — ABNORMAL HIGH (ref 0.40–1.20)
GFR: 42.6 mL/min — ABNORMAL LOW (ref >60.00)
Glucose, Bld: 99 mg/dL (ref 70–99)
Potassium: 4.9 mEq/L (ref 3.5–5.1)
Sodium: 140 mEq/L (ref 135–145)
Total Bilirubin: 0.4 mg/dL (ref 0.2–1.2)
Total Protein: 6.6 g/dL (ref 6.0–8.3)

## 2019-08-07 LAB — LIPID PANEL
Cholesterol: 185 mg/dL (ref 0–200)
HDL: 51.4 mg/dL (ref >39.00)
LDL Cholesterol: 113 mg/dL — ABNORMAL HIGH (ref 0–99)
NonHDL: 133.87
Total CHOL/HDL Ratio: 4
Triglycerides: 104 mg/dL (ref 0.0–149.0)
VLDL: 20.8 mg/dL (ref 0.0–40.0)

## 2019-08-07 LAB — FOLATE: Folate: 13.8 ng/mL (ref >5.9)

## 2019-08-07 LAB — CBC
HCT: 23.9 % — CL (ref 36.0–46.0)
Hemoglobin: 6.5 g/dL — CL (ref 12.0–15.0)
MCHC: 27.2 g/dL — ABNORMAL LOW (ref 30.0–36.0)
MCV: 59.9 fl — ABNORMAL LOW (ref 78.0–100.0)
Platelets: 520 10*3/uL — ABNORMAL HIGH (ref 150.0–400.0)
RBC: 4 Mil/uL (ref 3.87–5.11)
RDW: 20.8 % — ABNORMAL HIGH (ref 11.5–15.5)
WBC: 13.6 10*3/uL — ABNORMAL HIGH (ref 4.0–10.5)

## 2019-08-07 LAB — FERRITIN: Ferritin: 1.8 ng/mL — ABNORMAL LOW (ref 10.0–291.0)

## 2019-08-07 LAB — IBC PANEL
Iron: 11 ug/dL — ABNORMAL LOW (ref 42–145)
Saturation Ratios: 1.8 % — ABNORMAL LOW (ref 20.0–50.0)
Transferrin: 430 mg/dL — ABNORMAL HIGH (ref 212.0–360.0)

## 2019-08-07 LAB — VITAMIN B12: Vitamin B-12: 302 pg/mL (ref 211–911)

## 2019-08-07 MED ORDER — BENZONATATE 200 MG PO CAPS: 200.0000 mg | ORAL_CAPSULE | Freq: Three times a day (TID) | ORAL | 0 refills | Status: DC | PRN

## 2019-08-07 MED ORDER — CARISOPRODOL 350 MG PO TABS: 350.0000 mg | ORAL_TABLET | Freq: Three times a day (TID) | ORAL | 0 refills | Status: DC | PRN

## 2019-08-07 MED ORDER — OMEPRAZOLE 40 MG PO CPDR: 40.0000 mg | DELAYED_RELEASE_CAPSULE | Freq: Every day | ORAL | 3 refills | Status: DC

## 2019-08-07 NOTE — Assessment & Plan Note (Signed)
I have personally reviewed the Medicare Annual Wellness questionnaire and have noted 1. The patient's medical and social history 2. Their use of alcohol, tobacco or illicit drugs 3. Their current medications and supplements 4. The patient's functional ability including ADL's, fall risks, home safety risks and hearing or visual             impairment. 5. Diet and physical activities 6. Evidence for depression or mood disorders  The patients weight, height, BMI and visual acuity have been recorded in the chart I have made referrals, counseling and provided education to the patient based review of the above and I have provided the pt with a written personalized care plan for preventive services.  I have provided you with a copy of your personalized plan for preventive services. Please take the time to review along with your updated medication list.  Generally healthy but out of shape, etc Really needs colonoscopy Overdue for mammogram Flu vaccine in the fall Consider shingrix at the pharmacy

## 2019-08-07 NOTE — Telephone Encounter (Signed)
[facility] called in a critical result @ 1450  Hemoglobin 6.5 Hematocrit 23.9

## 2019-08-07 NOTE — Assessment & Plan Note (Signed)
Will recheck No rush for statin

## 2019-08-07 NOTE — Telephone Encounter (Signed)
Dr Alphonsus Sias is unavailable. Spoke to pt. She said in 2017 she tried to donate blood and they told her her hemoglobin was low and could not donate. She has had alot more fatigue than usual. No increase in bruising that she can tell.She has not seen any bleeding in her BMs.    I advised her that if anything changes for the worst between now and Dr Karle Starch response, she will go to the ER. That includes increased shortness of breath, bleeding, increased fatigue, etc.

## 2019-08-07 NOTE — Patient Instructions (Signed)
Please set up your screening mammogram. 

## 2019-08-07 NOTE — Assessment & Plan Note (Signed)
Now with persistent dysthymia No MDD Grieving, life changes, etc No meds for now

## 2019-08-07 NOTE — Telephone Encounter (Signed)
Aware- see other phone note

## 2019-08-07 NOTE — Progress Notes (Signed)
Hearing Screening   Method: Audiometry   125Hz  250Hz  500Hz  1000Hz  2000Hz  3000Hz  4000Hz  6000Hz  8000Hz   Right ear:   40 40 40  40    Left ear:   0 0 0  0    Vision Screening Comments: January 2021

## 2019-08-07 NOTE — Assessment & Plan Note (Signed)
No change in many years Negative stress test in past Cardiology if worsens

## 2019-08-07 NOTE — Assessment & Plan Note (Signed)
BP Readings from Last 3 Encounters:  08/07/19 116/74  04/27/19 (!) 109/53  03/30/19 107/61   BP fine off the diuretic No med needed

## 2019-08-07 NOTE — Assessment & Plan Note (Signed)
Ongoing symptoms Will change to 40mg  daily Might be cause of chest to back symptoms

## 2019-08-07 NOTE — Telephone Encounter (Signed)
Just received critical result  Hb of 6.5 and Hct of 23.9  Severe anemia looks to be new  Iron levels are low  This may be reason for shortness of breath/chest tightness   Has she been bleeding anywhere she knows of ?  Not sure if her PCP has seen this yet   Is this something she has had before ?  I recommend going to ER for evaluation   Will cc to pcp and Karie Georges
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
## 2019-08-09 NOTE — Telephone Encounter (Signed)
See other note

## 2019-08-09 NOTE — Telephone Encounter (Signed)
She has already been referred to [facility] for needed colonoscopy (since she was anemic near 2 years ago as well)---but I have also put in urgent referral to hematology to transfuse and give iron as needed. Please let her know she should hear this week about this (I left a message on her voice mail)

## 2019-08-10 NOTE — Telephone Encounter (Signed)
Spoke to pt. She has appt with hematology tomorrow.

## 2019-08-11 ENCOUNTER — Inpatient Hospital Stay: Payer: Medicare HMO

## 2019-08-11 ENCOUNTER — Encounter: Payer: Self-pay | Admitting: Oncology

## 2019-08-11 ENCOUNTER — Other Ambulatory Visit: Payer: Self-pay

## 2019-08-11 ENCOUNTER — Inpatient Hospital Stay: Payer: Medicare HMO | Attending: Oncology | Admitting: Oncology

## 2019-08-11 VITALS — BP 153/73 | HR 74 | Temp 97.3°F | Resp 20 | Wt 129.8 lb

## 2019-08-11 DIAGNOSIS — D509 Iron deficiency anemia, unspecified: Secondary | ICD-10-CM | POA: Insufficient documentation

## 2019-08-11 DIAGNOSIS — K449 Diaphragmatic hernia without obstruction or gangrene: Secondary | ICD-10-CM | POA: Diagnosis not present

## 2019-08-11 DIAGNOSIS — H9192 Unspecified hearing loss, left ear: Secondary | ICD-10-CM | POA: Diagnosis not present

## 2019-08-11 DIAGNOSIS — E785 Hyperlipidemia, unspecified: Secondary | ICD-10-CM | POA: Insufficient documentation

## 2019-08-11 DIAGNOSIS — D649 Anemia, unspecified: Secondary | ICD-10-CM

## 2019-08-11 DIAGNOSIS — R0602 Shortness of breath: Secondary | ICD-10-CM | POA: Diagnosis not present

## 2019-08-11 DIAGNOSIS — Z79899 Other long term (current) drug therapy: Secondary | ICD-10-CM | POA: Diagnosis not present

## 2019-08-11 DIAGNOSIS — I10 Essential (primary) hypertension: Secondary | ICD-10-CM | POA: Insufficient documentation

## 2019-08-11 DIAGNOSIS — R0600 Dyspnea, unspecified: Secondary | ICD-10-CM | POA: Insufficient documentation

## 2019-08-11 DIAGNOSIS — J42 Unspecified chronic bronchitis: Secondary | ICD-10-CM | POA: Diagnosis not present

## 2019-08-11 DIAGNOSIS — Z87891 Personal history of nicotine dependence: Secondary | ICD-10-CM | POA: Insufficient documentation

## 2019-08-11 DIAGNOSIS — R05 Cough: Secondary | ICD-10-CM | POA: Insufficient documentation

## 2019-08-11 DIAGNOSIS — K219 Gastro-esophageal reflux disease without esophagitis: Secondary | ICD-10-CM | POA: Diagnosis not present

## 2019-08-11 LAB — CBC WITH DIFFERENTIAL/PLATELET
Abs Immature Granulocytes: 0.03 10*3/uL (ref 0.00–0.07)
Basophils Absolute: 0.1 10*3/uL (ref 0.0–0.1)
Basophils Relative: 2 %
Eosinophils Absolute: 0.1 10*3/uL (ref 0.0–0.5)
Eosinophils Relative: 1 %
HCT: 25 % — ABNORMAL LOW (ref 36.0–46.0)
Hemoglobin: 6.3 g/dL — ABNORMAL LOW (ref 12.0–15.0)
Immature Granulocytes: 0 %
Lymphocytes Relative: 18 %
Lymphs Abs: 1.5 10*3/uL (ref 0.7–4.0)
MCH: 16 pg — ABNORMAL LOW (ref 26.0–34.0)
MCHC: 25.2 g/dL — ABNORMAL LOW (ref 30.0–36.0)
MCV: 63.6 fL — ABNORMAL LOW (ref 80.0–100.0)
Monocytes Absolute: 0.6 10*3/uL (ref 0.1–1.0)
Monocytes Relative: 7 %
Neutro Abs: 5.8 10*3/uL (ref 1.7–7.7)
Neutrophils Relative %: 72 %
Platelets: 559 10*3/uL — ABNORMAL HIGH (ref 150–400)
RBC: 3.93 MIL/uL (ref 3.87–5.11)
RDW: 21 % — ABNORMAL HIGH (ref 11.5–15.5)
WBC: 8 10*3/uL (ref 4.0–10.5)
nRBC: 0 % (ref 0.0–0.2)

## 2019-08-11 LAB — SAMPLE TO BLOOD BANK

## 2019-08-11 NOTE — Progress Notes (Signed)
[facility]  [phone] [phone]  ID: Meghan Moody OB: [birth year]  MR#: [id number]  [id number]#:[id number]  Patient Care Team: [name], MD as PCP - General [name], MD as Referring Physician (Pediatrics)  CHIEF COMPLAINT: Iron deficiency anemia.  INTERVAL HISTORY: Patient is a 74 year old female who was noted to have severe iron deficiency anemia on recent blood work.  Patient states she is gradually become more weak and fatigued as well as increased dyspnea exertion over the past year.  She has no neurologic complaints.  She denies any recent fevers or illnesses.  She has a good appetite and denies weight loss.  She has no chest pain, cough, or hemoptysis.  She denies any nausea, vomiting, constipation, or diarrhea.  She has no melena or hematochezia.  She has no urinary complaints.  Patient otherwise feels well and offers no further specific complaints today.  REVIEW OF SYSTEMS:   Review of Systems  Constitutional: Positive for malaise/fatigue. Negative for fever and weight loss.  Respiratory: Positive for shortness of breath. Negative for cough and hemoptysis.   Cardiovascular: Negative.  Negative for chest pain and leg swelling.  Gastrointestinal: Negative.  Negative for abdominal pain, blood in stool and melena.  Genitourinary: Negative.  Negative for hematuria.  Musculoskeletal: Negative.  Negative for back pain.  Skin: Negative.  Negative for rash.  Neurological: Positive for weakness. Negative for dizziness and focal weakness.  Psychiatric/Behavioral: Negative.  The patient is not nervous/anxious.     As per HPI. Otherwise, a complete review of systems is negative.  PAST MEDICAL HISTORY: Past Medical History:  Diagnosis Date  . Anemia   . Chronic bronchitis (HCC)   . Chronic cough   . Deafness in left ear   . GERD (gastroesophageal reflux disease)   . Hiatal hernia   . Hyperlipidemia   . Hypertension   . SOB  (shortness of breath) on exertion     PAST SURGICAL HISTORY: Past Surgical History:  Procedure Laterality Date  . BREAST CYST ASPIRATION Bilateral    neg  . BREAST EXCISIONAL BIOPSY Right 1978   neg  . BUNIONECTOMY    . CATARACT EXTRACTION W/PHACO Right 03/30/2019   Procedure: CATARACT EXTRACTION PHACO AND INTRAOCULAR LENS PLACEMENT (IOC) RIGHT, 1.23, 00:20.8;  Surgeon: Nevada Crane, MD;  Location: [facility];  Service: Ophthalmology;  Laterality: Right;  . CATARACT EXTRACTION W/PHACO Left 04/27/2019   Procedure: CATARACT EXTRACTION PHACO AND INTRAOCULAR LENS PLACEMENT (IOC) LEFT 1.46  00:20.3;  Surgeon: Nevada Crane, MD;  Location: [facility];  Service: Ophthalmology;  Laterality: Left;  . TUBAL LIGATION      FAMILY HISTORY: Family History  Problem Relation Age of Onset  . Coronary artery disease Mother   . Heart disease Mother   . Hypertension Mother   . Stroke Father   . Drug abuse Brother     ADVANCED DIRECTIVES (Y/N):  N  HEALTH MAINTENANCE: Social History   Tobacco Use  . Smoking status: Former Smoker    Packs/day: 0.30    Years: 40.00    Pack years: 12.00    Types: Cigarettes    Quit date: 04/16/2006    Years since quitting: 13.3  . Smokeless tobacco: Never Used  Substance Use Topics  . Alcohol use: No  . Drug use: No     Colonoscopy:  PAP:  Bone density:  Lipid panel:  No Known Allergies  Current Outpatient Medications  Medication Sig Dispense Refill  .  acetaminophen (TYLENOL) 500 MG tablet Take 500 mg by mouth every 8 (eight) hours as needed.    . benzonatate (TESSALON) 200 MG capsule Take 1 capsule (200 mg total) by mouth 3 (three) times daily as needed for cough. 60 capsule 0  . carisoprodol (SOMA) 350 MG tablet Take 1 tablet (350 mg total) by mouth 3 (three) times daily as needed for muscle spasms. 30 tablet 0  . omeprazole (PRILOSEC) 40 MG capsule Take 1 capsule (40 mg total) by mouth daily. 90 capsule 3   No current  facility-administered medications for this visit.    OBJECTIVE: Vitals:   08/11/19 1120  BP: (!) 153/73  Pulse: 74  Resp: 20  Temp: (!) 97.3 F (36.3 C)  SpO2: 100%     Body mass index is 21.6 kg/m.    ECOG FS:1 - Symptomatic but completely ambulatory  General: Well-developed, well-nourished, no acute distress. Eyes: Pink conjunctiva, anicteric sclera. HEENT: Normocephalic, moist mucous membranes. Lungs: No audible wheezing or coughing. Heart: Regular rate and rhythm. Abdomen: Soft, nontender, no obvious distention. Musculoskeletal: No edema, cyanosis, or clubbing. Neuro: Alert, answering all questions appropriately. Cranial nerves grossly intact. Skin: No rashes or petechiae noted. Psych: Normal affect. Lymphatics: No cervical, calvicular, axillary or inguinal LAD.   LAB RESULTS:  Lab Results  Component Value Date   NA 140 08/07/2019   K 4.9 08/07/2019   CL 106 08/07/2019   CO2 26 08/07/2019   GLUCOSE 99 08/07/2019   BUN 19 08/07/2019   CREATININE 1.23 (H) 08/07/2019   CALCIUM 9.8 08/07/2019   PROT 6.6 08/07/2019   ALBUMIN 4.4 08/07/2019   AST 11 08/07/2019   ALT 9 08/07/2019   ALKPHOS 84 08/07/2019   BILITOT 0.4 08/07/2019   GFRNONAA 42 (L) 08/29/2015   GFRAA 49 (L) 08/29/2015    Lab Results  Component Value Date   WBC 8.0 08/11/2019   NEUTROABS 5.8 08/11/2019   HGB 6.3 (L) 08/11/2019   HCT 25.0 (L) 08/11/2019   MCV 63.6 (L) 08/11/2019   PLT 559 (H) 08/11/2019   Lab Results  Component Value Date   IRON 11 (L) 08/07/2019   IRONPCTSAT 1.8 (L) 08/07/2019   Lab Results  Component Value Date   FERRITIN 1.8 (L) 08/07/2019     STUDIES: No results found.  ASSESSMENT: Iron deficiency anemia.  PLAN:    1.  Iron deficiency anemia: Patient noted to have severely decreased hemoglobin and iron stores and she is also symptomatic.  She will require luminal evaluation with GI to assess for a source and this appointment is pending.  Patient will return to  clinic on August 13, 2019 to receive 2 units packed red blood cells.  She would then return to clinic 1 week later for repeat laboratory work, further evaluation, and consideration of IV iron. 2.  Dyspnea exertion/shortness of breath: Secondary to severe anemia.  Transfusion as above.  I spent a total of 45 minutes reviewing chart data, face-to-face evaluation with the patient, counseling and coordination of care as detailed above.   Patient expressed understanding and was in agreement with this plan. She also understands that She can call clinic at any time with any questions, concerns, or complaints.   Cancer Staging No matching staging information was found for the patient.  Lloyd Huger, MD   08/11/2019 3:36 PM

## 2019-08-11 NOTE — Progress Notes (Signed)
Patient here today as a new patient. States she is extremely tired.

## 2019-08-12 ENCOUNTER — Telehealth: Payer: Self-pay | Admitting: Emergency Medicine

## 2019-08-12 ENCOUNTER — Other Ambulatory Visit: Payer: Self-pay | Admitting: Oncology

## 2019-08-12 DIAGNOSIS — D509 Iron deficiency anemia, unspecified: Secondary | ICD-10-CM

## 2019-08-12 NOTE — Telephone Encounter (Signed)
Got notification from blood bank that patient needs a second ABO drawn prior to blood being released for patient as she has never had a transfusion here before. Lab appt scheduled for tomorrow. Patient called to be notified of change to appt and reason why, no answer, message left.

## 2019-08-13 ENCOUNTER — Other Ambulatory Visit: Payer: Self-pay

## 2019-08-13 ENCOUNTER — Other Ambulatory Visit: Payer: Self-pay | Admitting: Emergency Medicine

## 2019-08-13 ENCOUNTER — Inpatient Hospital Stay: Payer: Medicare HMO

## 2019-08-13 DIAGNOSIS — J42 Unspecified chronic bronchitis: Secondary | ICD-10-CM | POA: Diagnosis not present

## 2019-08-13 DIAGNOSIS — I10 Essential (primary) hypertension: Secondary | ICD-10-CM | POA: Diagnosis not present

## 2019-08-13 DIAGNOSIS — R0602 Shortness of breath: Secondary | ICD-10-CM | POA: Diagnosis not present

## 2019-08-13 DIAGNOSIS — K449 Diaphragmatic hernia without obstruction or gangrene: Secondary | ICD-10-CM | POA: Diagnosis not present

## 2019-08-13 DIAGNOSIS — E785 Hyperlipidemia, unspecified: Secondary | ICD-10-CM | POA: Diagnosis not present

## 2019-08-13 DIAGNOSIS — K219 Gastro-esophageal reflux disease without esophagitis: Secondary | ICD-10-CM | POA: Diagnosis not present

## 2019-08-13 DIAGNOSIS — R05 Cough: Secondary | ICD-10-CM | POA: Diagnosis not present

## 2019-08-13 DIAGNOSIS — R0600 Dyspnea, unspecified: Secondary | ICD-10-CM | POA: Diagnosis not present

## 2019-08-13 DIAGNOSIS — H9192 Unspecified hearing loss, left ear: Secondary | ICD-10-CM | POA: Diagnosis not present

## 2019-08-13 DIAGNOSIS — D649 Anemia, unspecified: Secondary | ICD-10-CM

## 2019-08-13 DIAGNOSIS — D509 Iron deficiency anemia, unspecified: Secondary | ICD-10-CM | POA: Diagnosis not present

## 2019-08-13 LAB — PREPARE RBC (CROSSMATCH)

## 2019-08-13 LAB — ABO/RH: ABO/RH(D): O POS

## 2019-08-13 MED ORDER — SODIUM CHLORIDE 0.9% IV SOLUTION
250.0000 mL | Freq: Once | INTRAVENOUS | Status: AC
  Administered 2019-08-13: 10:00:00 250 mL via INTRAVENOUS
  Filled 2019-08-13: qty 250

## 2019-08-13 MED ORDER — ACETAMINOPHEN 325 MG PO TABS
650.0000 mg | ORAL_TABLET | Freq: Once | ORAL | Status: AC
  Administered 2019-08-13: 650 mg via ORAL
  Filled 2019-08-13: qty 2

## 2019-08-13 MED ORDER — DIPHENHYDRAMINE HCL 50 MG/ML IJ SOLN
25.0000 mg | Freq: Once | INTRAMUSCULAR | Status: AC
  Administered 2019-08-13: 25 mg via INTRAVENOUS
  Filled 2019-08-13: qty 1

## 2019-08-14 LAB — TYPE AND SCREEN
ABO/RH(D): O POS
Antibody Screen: NEGATIVE
Unit division: 0
Unit division: 0

## 2019-08-14 LAB — BPAM RBC
Blood Product Expiration Date: 202106012359
Blood Product Expiration Date: 202106022359
ISSUE DATE / TIME: 202104290940
ISSUE DATE / TIME: 202104291122
Unit Type and Rh: 5100
Unit Type and Rh: 5100

## 2019-08-15 NOTE — Progress Notes (Signed)
[place]  Telephone:(336) [phone] [phone]) [phone]  ID: Meghan Moody OB: Feb 27, 1946  MR#: [id number]  PXT#:[id number]  Patient Care Team: Venia Carbon, MD as PCP - General Venia Carbon, MD as Referring Physician (Pediatrics) Lloyd Huger, MD as Consulting Physician (Oncology)  CHIEF COMPLAINT: Iron deficiency anemia.  INTERVAL HISTORY: Patient returns to clinic today for repeat laboratory work and further evaluation.  She feels significantly improved after receiving 2 units of packed red blood cells recently.  She no longer complains of weakness or fatigue.  She denies any shortness of breath or dyspnea on exertion.  She has no neurologic complaints.  She denies any recent fevers or illnesses.  She has a good appetite and denies weight loss.  She has no chest pain, cough, or hemoptysis.  She denies any nausea, vomiting, constipation, or diarrhea.  She has no melena or hematochezia.  She has no urinary complaints.  Patient offers no specific complaints today.  REVIEW OF SYSTEMS:   Review of Systems  Constitutional: Negative.  Negative for fever, malaise/fatigue and weight loss.  Respiratory: Negative.  Negative for cough, hemoptysis and shortness of breath.   Cardiovascular: Negative.  Negative for chest pain and leg swelling.  Gastrointestinal: Negative.  Negative for abdominal pain, blood in stool and melena.  Genitourinary: Negative.  Negative for hematuria.  Musculoskeletal: Negative.  Negative for back pain.  Skin: Negative.  Negative for rash.  Neurological: Negative.  Negative for dizziness, focal weakness, weakness and headaches.  Psychiatric/Behavioral: Negative.  The patient is not nervous/anxious.     As per HPI. Otherwise, a complete review of systems is negative.  PAST MEDICAL HISTORY: Past Medical History:  Diagnosis Date  . Anemia   . Chronic bronchitis ([place])   . Chronic cough   . Deafness in left ear   . GERD  (gastroesophageal reflux disease)   . Hiatal hernia   . Hyperlipidemia   . Hypertension   . SOB (shortness of breath) on exertion     PAST SURGICAL HISTORY: Past Surgical History:  Procedure Laterality Date  . BREAST CYST ASPIRATION Bilateral    neg  . BREAST EXCISIONAL BIOPSY Right 1978   neg  . BUNIONECTOMY    . CATARACT EXTRACTION W/PHACO Right 03/30/2019   Procedure: CATARACT EXTRACTION PHACO AND INTRAOCULAR LENS PLACEMENT (IOC) RIGHT, 1.23, 00:20.8;  Surgeon: Eulogio Bear, MD;  Location: [place];  Service: Ophthalmology;  Laterality: Right;  . CATARACT EXTRACTION W/PHACO Left 04/27/2019   Procedure: CATARACT EXTRACTION PHACO AND INTRAOCULAR LENS PLACEMENT (IOC) LEFT 1.46  00:20.3;  Surgeon: Eulogio Bear, MD;  Location: [place];  Service: Ophthalmology;  Laterality: Left;  . TUBAL LIGATION      FAMILY HISTORY: Family History  Problem Relation Age of Onset  . Coronary artery disease Mother   . Heart disease Mother   . Hypertension Mother   . Stroke Father   . Drug abuse Brother     ADVANCED DIRECTIVES (Y/N):  N  HEALTH MAINTENANCE: Social History   Tobacco Use  . Smoking status: Former Smoker    Packs/day: 0.30    Years: 40.00    Pack years: 12.00    Types: Cigarettes    Quit date: 04/16/2006    Years since quitting: 13.3  . Smokeless tobacco: Never Used  Substance Use Topics  . Alcohol use: No  . Drug use: No     Colonoscopy:  PAP:  Bone density:  Lipid panel:  No Known  Allergies  Current Outpatient Medications  Medication Sig Dispense Refill  . acetaminophen (TYLENOL) 500 MG tablet Take 500 mg by mouth every 8 (eight) hours as needed.    . benzonatate (TESSALON) 200 MG capsule Take 1 capsule (200 mg total) by mouth 3 (three) times daily as needed for cough. 60 capsule 0  . carisoprodol (SOMA) 350 MG tablet Take 1 tablet (350 mg total) by mouth 3 (three) times daily as needed for muscle spasms. 30 tablet 0  .  omeprazole (PRILOSEC) 40 MG capsule Take 1 capsule (40 mg total) by mouth daily. 90 capsule 3   No current facility-administered medications for this visit.    OBJECTIVE: Vitals:   08/20/19 1336  BP: (!) 160/68  Pulse: 61  Resp: 20  Temp: (!) 96.8 F (36 C)  SpO2: 100%     Body mass index is 22.48 kg/m.    ECOG FS:0 - Asymptomatic  General: Well-developed, well-nourished, no acute distress. Eyes: Pink conjunctiva, anicteric sclera. HEENT: Normocephalic, moist mucous membranes. Lungs: No audible wheezing or coughing. Heart: Regular rate and rhythm. Abdomen: Soft, nontender, no obvious distention. Musculoskeletal: No edema, cyanosis, or clubbing. Neuro: Alert, answering all questions appropriately. Cranial nerves grossly intact. Skin: No rashes or petechiae noted. Psych: Normal affect.   LAB RESULTS:  Lab Results  Component Value Date   NA 140 08/07/2019   K 4.9 08/07/2019   CL 106 08/07/2019   CO2 26 08/07/2019   GLUCOSE 99 08/07/2019   BUN 19 08/07/2019   CREATININE 1.23 (H) 08/07/2019   CALCIUM 9.8 08/07/2019   PROT 6.6 08/07/2019   ALBUMIN 4.4 08/07/2019   AST 11 08/07/2019   ALT 9 08/07/2019   ALKPHOS 84 08/07/2019   BILITOT 0.4 08/07/2019   GFRNONAA 42 (L) 08/29/2015   GFRAA 49 (L) 08/29/2015    Lab Results  Component Value Date   WBC 7.3 08/20/2019   NEUTROABS 5.1 08/20/2019   HGB 8.5 (L) 08/20/2019   HCT 30.5 (L) 08/20/2019   MCV 68.5 (L) 08/20/2019   PLT 381 08/20/2019   Lab Results  Component Value Date   IRON 16 (L) 08/20/2019   TIBC 518 (H) 08/20/2019   IRONPCTSAT 3 (L) 08/20/2019   Lab Results  Component Value Date   FERRITIN 3 (L) 08/20/2019     STUDIES: No results found.  ASSESSMENT: Iron deficiency anemia.  PLAN:    1.  Iron deficiency anemia: Patient's hemoglobin has significantly improved to 8.5 with transfusion.  Her iron stores remain significantly reduced.  She will require luminal evaluation with GI to assess for a  source and this appointment is pending.  Proceed with 200 mg IV Venofer today.  Return to clinic in 1 and 2 weeks for additional treatment.  Patient will then return to clinic in 6 weeks with repeat laboratory work, further evaluation, and continuation of treatment if needed. 2.  Dyspnea exertion/shortness of breath: Resolved. 3.  Hypertension: Patient's blood pressure is moderately elevated today.  Continue monitoring and treatment per primary care.   Patient expressed understanding and was in agreement with this plan. She also understands that She can call clinic at any time with any questions, concerns, or complaints.   Cancer Staging No matching staging information was found for the patient.  Jeralyn Ruths, MD   08/20/2019 6:49 PM

## 2019-08-19 ENCOUNTER — Other Ambulatory Visit: Payer: Self-pay | Admitting: Emergency Medicine

## 2019-08-19 DIAGNOSIS — D509 Iron deficiency anemia, unspecified: Secondary | ICD-10-CM

## 2019-08-20 ENCOUNTER — Inpatient Hospital Stay: Payer: Medicare HMO | Attending: Oncology

## 2019-08-20 ENCOUNTER — Other Ambulatory Visit: Payer: Self-pay

## 2019-08-20 ENCOUNTER — Encounter: Payer: Self-pay | Admitting: Oncology

## 2019-08-20 ENCOUNTER — Inpatient Hospital Stay (HOSPITAL_BASED_OUTPATIENT_CLINIC_OR_DEPARTMENT_OTHER): Payer: Medicare HMO | Admitting: Oncology

## 2019-08-20 ENCOUNTER — Inpatient Hospital Stay: Payer: Medicare HMO

## 2019-08-20 VITALS — BP 168/77 | HR 60

## 2019-08-20 VITALS — BP 160/68 | HR 61 | Temp 96.8°F | Resp 20 | Wt 135.1 lb

## 2019-08-20 DIAGNOSIS — D509 Iron deficiency anemia, unspecified: Secondary | ICD-10-CM | POA: Insufficient documentation

## 2019-08-20 DIAGNOSIS — I10 Essential (primary) hypertension: Secondary | ICD-10-CM | POA: Insufficient documentation

## 2019-08-20 LAB — CBC WITH DIFFERENTIAL/PLATELET
Abs Immature Granulocytes: 0.02 10*3/uL (ref 0.00–0.07)
Basophils Absolute: 0.1 10*3/uL (ref 0.0–0.1)
Basophils Relative: 2 %
Eosinophils Absolute: 0.1 10*3/uL (ref 0.0–0.5)
Eosinophils Relative: 2 %
HCT: 30.5 % — ABNORMAL LOW (ref 36.0–46.0)
Hemoglobin: 8.5 g/dL — ABNORMAL LOW (ref 12.0–15.0)
Immature Granulocytes: 0 %
Lymphocytes Relative: 21 %
Lymphs Abs: 1.5 10*3/uL (ref 0.7–4.0)
MCH: 19.1 pg — ABNORMAL LOW (ref 26.0–34.0)
MCHC: 27.9 g/dL — ABNORMAL LOW (ref 30.0–36.0)
MCV: 68.5 fL — ABNORMAL LOW (ref 80.0–100.0)
Monocytes Absolute: 0.5 10*3/uL (ref 0.1–1.0)
Monocytes Relative: 7 %
Neutro Abs: 5.1 10*3/uL (ref 1.7–7.7)
Neutrophils Relative %: 68 %
Platelets: 381 10*3/uL (ref 150–400)
RBC: 4.45 MIL/uL (ref 3.87–5.11)
RDW: 26.9 % — ABNORMAL HIGH (ref 11.5–15.5)
WBC: 7.3 10*3/uL (ref 4.0–10.5)
nRBC: 0 % (ref 0.0–0.2)

## 2019-08-20 LAB — IRON AND TIBC
Iron: 16 ug/dL — ABNORMAL LOW (ref 28–170)
Saturation Ratios: 3 % — ABNORMAL LOW (ref 10.4–31.8)
TIBC: 518 ug/dL — ABNORMAL HIGH (ref 250–450)
UIBC: 502 ug/dL

## 2019-08-20 LAB — FERRITIN: Ferritin: 3 ng/mL — ABNORMAL LOW (ref 11–307)

## 2019-08-20 MED ORDER — IRON SUCROSE 20 MG/ML IV SOLN
200.0000 mg | Freq: Once | INTRAVENOUS | Status: AC
  Administered 2019-08-20: 200 mg via INTRAVENOUS
  Filled 2019-08-20: qty 10

## 2019-08-20 MED ORDER — SODIUM CHLORIDE 0.9 % IV SOLN
Freq: Once | INTRAVENOUS | Status: AC
  Filled 2019-08-20: qty 250

## 2019-08-27 ENCOUNTER — Other Ambulatory Visit: Payer: Self-pay

## 2019-08-27 ENCOUNTER — Inpatient Hospital Stay: Payer: Medicare HMO

## 2019-08-27 VITALS — BP 140/73 | HR 69 | Temp 97.0°F | Resp 19

## 2019-08-27 DIAGNOSIS — D509 Iron deficiency anemia, unspecified: Secondary | ICD-10-CM

## 2019-08-27 DIAGNOSIS — I10 Essential (primary) hypertension: Secondary | ICD-10-CM | POA: Diagnosis not present

## 2019-08-27 MED ORDER — IRON SUCROSE 20 MG/ML IV SOLN
200.0000 mg | Freq: Once | INTRAVENOUS | Status: AC
  Administered 2019-08-27: 200 mg via INTRAVENOUS
  Filled 2019-08-27: qty 10

## 2019-08-27 MED ORDER — SODIUM CHLORIDE 0.9 % IV SOLN
Freq: Once | INTRAVENOUS | Status: AC
  Filled 2019-08-27: qty 250

## 2019-08-27 NOTE — Progress Notes (Signed)
Vitals reviewed with MD and treatment team. Per MD okay to proceed with Venofer infusion today. Pt expressed that she "feels fine and is having no complications."    Infusion completed at 1240- BP -140/73 HR-63. Pt has no complaints at this time. VSS. RN encouraged pt to reach out to her primary care provider about antihypertensives that she was taking and to notified the clinic if any complications occur at home. Pt verbalized understanding and all questions answered at this time. Pt stable for discharge.   Sherial Ebrahim [employer]

## 2019-09-03 ENCOUNTER — Inpatient Hospital Stay: Payer: Medicare HMO

## 2019-09-03 ENCOUNTER — Other Ambulatory Visit: Payer: Self-pay

## 2019-09-03 VITALS — BP 138/70 | HR 66 | Resp 20

## 2019-09-03 DIAGNOSIS — D509 Iron deficiency anemia, unspecified: Secondary | ICD-10-CM | POA: Diagnosis not present

## 2019-09-03 DIAGNOSIS — I10 Essential (primary) hypertension: Secondary | ICD-10-CM | POA: Diagnosis not present

## 2019-09-03 MED ORDER — IRON SUCROSE 20 MG/ML IV SOLN
200.0000 mg | Freq: Once | INTRAVENOUS | Status: AC
  Administered 2019-09-03: 200 mg via INTRAVENOUS
  Filled 2019-09-03: qty 10

## 2019-09-03 MED ORDER — SODIUM CHLORIDE 0.9 % IV SOLN
Freq: Once | INTRAVENOUS | Status: AC
  Filled 2019-09-03: qty 250

## 2019-09-29 ENCOUNTER — Ambulatory Visit (INDEPENDENT_AMBULATORY_CARE_PROVIDER_SITE_OTHER): Payer: Medicare HMO | Admitting: Gastroenterology

## 2019-09-29 ENCOUNTER — Encounter: Payer: Self-pay | Admitting: Gastroenterology

## 2019-09-29 ENCOUNTER — Other Ambulatory Visit: Payer: Self-pay

## 2019-09-29 VITALS — BP 152/62 | HR 70 | Temp 97.3°F | Ht 65.0 in | Wt 133.0 lb

## 2019-09-29 DIAGNOSIS — D509 Iron deficiency anemia, unspecified: Secondary | ICD-10-CM

## 2019-09-29 DIAGNOSIS — K219 Gastro-esophageal reflux disease without esophagitis: Secondary | ICD-10-CM | POA: Diagnosis not present

## 2019-09-29 MED ORDER — NA SULFATE-K SULFATE-MG SULF 17.5-3.13-1.6 GM/177ML PO SOLN
354.0000 mL | Freq: Once | ORAL | 0 refills | Status: AC
Start: 2019-09-29 — End: 2019-09-29

## 2019-09-29 NOTE — Progress Notes (Signed)
[place]  Telephone:(336) [phone] [phone]) [phone]  ID: DAVINITY FANARA OB: 02-Nov-1945  MR#: [id number]  ZWC#:[id number]  Patient Care Team: Venia Carbon, MD as PCP - General Venia Carbon, MD as Referring Physician (Pediatrics) Lloyd Huger, MD as Consulting Physician (Oncology)  CHIEF COMPLAINT: Iron deficiency anemia.  INTERVAL HISTORY: Patient returns to clinic today for repeat laboratory work, further evaluation, and consideration of additional IV iron.  She currently feels well and is asymptomatic.  She denies any weakness or fatigue. She has no neurologic complaints.  She denies any recent fevers or illnesses.  She has a good appetite and denies weight loss.  She denies any chest pain, shortness of breath, cough, or hemoptysis.  She denies any nausea, vomiting, constipation, or diarrhea.  She has no melena or hematochezia.  She has no urinary complaints.  Patient offers no specific complaints today.  REVIEW OF SYSTEMS:   Review of Systems  Constitutional: Negative.  Negative for fever, malaise/fatigue and weight loss.  Respiratory: Negative.  Negative for cough, hemoptysis and shortness of breath.   Cardiovascular: Negative.  Negative for chest pain and leg swelling.  Gastrointestinal: Negative.  Negative for abdominal pain, blood in stool and melena.  Genitourinary: Negative.  Negative for hematuria.  Musculoskeletal: Negative.  Negative for back pain.  Skin: Negative.  Negative for rash.  Neurological: Negative.  Negative for dizziness, focal weakness, weakness and headaches.  Psychiatric/Behavioral: Negative.  The patient is not nervous/anxious.     As per HPI. Otherwise, a complete review of systems is negative.  PAST MEDICAL HISTORY: Past Medical History:  Diagnosis Date  . Anemia   . Chronic bronchitis ([place])   . Chronic cough   . Deafness in left ear   . GERD (gastroesophageal reflux disease)   . Hiatal hernia   .  Hyperlipidemia   . Hypertension   . SOB (shortness of breath) on exertion     PAST SURGICAL HISTORY: Past Surgical History:  Procedure Laterality Date  . BREAST CYST ASPIRATION Bilateral    neg  . BREAST EXCISIONAL BIOPSY Right 1978   neg  . BUNIONECTOMY    . CATARACT EXTRACTION W/PHACO Right 03/30/2019   Procedure: CATARACT EXTRACTION PHACO AND INTRAOCULAR LENS PLACEMENT (IOC) RIGHT, 1.23, 00:20.8;  Surgeon: Eulogio Bear, MD;  Location: [place];  Service: Ophthalmology;  Laterality: Right;  . CATARACT EXTRACTION W/PHACO Left 04/27/2019   Procedure: CATARACT EXTRACTION PHACO AND INTRAOCULAR LENS PLACEMENT (IOC) LEFT 1.46  00:20.3;  Surgeon: Eulogio Bear, MD;  Location: [place];  Service: Ophthalmology;  Laterality: Left;  . TUBAL LIGATION      FAMILY HISTORY: Family History  Problem Relation Age of Onset  . Coronary artery disease Mother   . Heart disease Mother   . Hypertension Mother   . Stroke Father   . Drug abuse Brother     ADVANCED DIRECTIVES (Y/N):  N  HEALTH MAINTENANCE: Social History   Tobacco Use  . Smoking status: Former Smoker    Packs/day: 0.30    Years: 40.00    Pack years: 12.00    Types: Cigarettes    Quit date: 04/16/2006    Years since quitting: 13.4  . Smokeless tobacco: Never Used  Vaping Use  . Vaping Use: Never used  Substance Use Topics  . Alcohol use: No  . Drug use: No     Colonoscopy:  PAP:  Bone density:  Lipid panel:  No Known Allergies  Current Outpatient  Medications  Medication Sig Dispense Refill  . acetaminophen (TYLENOL) 500 MG tablet Take 500 mg by mouth every 8 (eight) hours as needed.    Marland Kitchen omeprazole (PRILOSEC) 40 MG capsule Take 1 capsule (40 mg total) by mouth daily. 90 capsule 3  . benzonatate (TESSALON) 200 MG capsule Take 1 capsule (200 mg total) by mouth 3 (three) times daily as needed for cough. (Patient not taking: Reported on 10/01/2019) 60 capsule 0  . carisoprodol (SOMA) 350  MG tablet Take 1 tablet (350 mg total) by mouth 3 (three) times daily as needed for muscle spasms. (Patient not taking: Reported on 10/01/2019) 30 tablet 0   No current facility-administered medications for this visit.    OBJECTIVE: There were no vitals filed for this visit.   There is no height or weight on file to calculate BMI.    ECOG FS:0 - Asymptomatic  General: Well-developed, well-nourished, no acute distress. Eyes: Pink conjunctiva, anicteric sclera. HEENT: Normocephalic, moist mucous membranes. Lungs: No audible wheezing or coughing. Heart: Regular rate and rhythm. Abdomen: Soft, nontender, no obvious distention. Musculoskeletal: No edema, cyanosis, or clubbing. Neuro: Alert, answering all questions appropriately. Cranial nerves grossly intact. Skin: No rashes or petechiae noted. Psych: Normal affect.   LAB RESULTS:  Lab Results  Component Value Date   NA 140 08/07/2019   K 4.9 08/07/2019   CL 106 08/07/2019   CO2 26 08/07/2019   GLUCOSE 99 08/07/2019   BUN 19 08/07/2019   CREATININE 1.23 (H) 08/07/2019   CALCIUM 9.8 08/07/2019   PROT 6.6 08/07/2019   ALBUMIN 4.4 08/07/2019   AST 11 08/07/2019   ALT 9 08/07/2019   ALKPHOS 84 08/07/2019   BILITOT 0.4 08/07/2019   GFRNONAA 42 (L) 08/29/2015   GFRAA 49 (L) 08/29/2015    Lab Results  Component Value Date   WBC 6.8 10/02/2019   NEUTROABS 4.9 10/02/2019   HGB 11.7 (L) 10/02/2019   HCT 39.2 10/02/2019   MCV 81.2 10/02/2019   PLT 260 10/02/2019   Lab Results  Component Value Date   IRON 37 10/02/2019   TIBC 470 (H) 10/02/2019   IRONPCTSAT 8 (L) 10/02/2019   Lab Results  Component Value Date   FERRITIN 10 (L) 10/02/2019     STUDIES: No results found.  ASSESSMENT: Iron deficiency anemia.  PLAN:    1.  Iron deficiency anemia: Although patient's hemoglobin and iron stores remain mildly decreased, they have significantly improved and patient is now asymptomatic.  Patient also has a repeat colonoscopy  and EGD scheduled in the near future.  She does not require additional IV Venofer today, but suspect she will need additional treatments in the future.  She last received 200 mg IV Venofer on Sep 03, 2019.  Return to clinic in 3 months with repeat laboratory work, further evaluation, and consideration of additional IV iron.   2.  Dyspnea exertion/shortness of breath: Secondary to anemia.  Resolved.  I spent a total of 20 minutes reviewing chart data, face-to-face evaluation with the patient, counseling and coordination of care as detailed above.   Patient expressed understanding and was in agreement with this plan. She also understands that She can call clinic at any time with any questions, concerns, or complaints.     Jeralyn Ruths, MD   10/04/2019 8:07 AM

## 2019-09-29 NOTE — Progress Notes (Signed)
[name], MD [address]  [address]  [place], [place] [address]  Main: [phone]  Fax: [phone]    Gastroenterology Consultation  Referring Provider:     [name], MD Primary Care Physician:  [name], MD Primary Gastroenterologist:  Dr. [name] Reason for Consultation:     Chronic iron deficiency anemia, chronic GERD        HPI:   Meghan Moody is a 74 y.o. female referred by Dr. [name], MD  for consultation & management of chronic iron deficiency anemia chronic GERD  Chronic iron deficiency anemia: Patient has history of iron deficiency anemia since 08/2015, she was on oral iron in the past.  Most recently on 08/07/2019, patient to follow-up with her PCP, [name], she was complaining of exertional dyspnea, chest pain, underwent work-up, found to have hemoglobin of 6.5, MCV 59.9, ferritin 1.8.  Patient reports that she has been symptomatic for few months, she was busy taking care of her mom who recently died.  She is then referred to [name] on 4/27, who started her on IV iron.  She also received blood transfusion, her hemoglobin responded to 8.5 Since then, she reports feeling significantly better  Patient denies any black stools, rectal bleeding, abdominal pain, weight loss.  She reports that she had noticed black stools several months ago  Chronic GERD: She has been on omeprazole 20 mg daily, recently increased to 40 mg daily due to heartburn.  She denies difficulty swallowing, epigastric pain, nausea or vomiting  She does not smoke or drink alcohol  NSAIDs: None  Antiplts/Anticoagulants/Anti thrombotics: None  GI Procedures: Colonoscopy in 2002  Past Medical History:  Diagnosis Date  . Anemia   . Chronic bronchitis (HCC)   . Chronic cough   . Deafness in left ear   . GERD (gastroesophageal reflux disease)   . Hiatal hernia   . Hyperlipidemia   . Hypertension   . SOB (shortness of breath) on exertion      Past Surgical History:  Procedure Laterality Date  . BREAST CYST ASPIRATION Bilateral    neg  . BREAST EXCISIONAL BIOPSY Right 1978   neg  . BUNIONECTOMY    . CATARACT EXTRACTION W/PHACO Right 03/30/2019   Procedure: CATARACT EXTRACTION PHACO AND INTRAOCULAR LENS PLACEMENT (IOC) RIGHT, 1.23, 00:20.8;  Surgeon: Nevada Crane, MD;  Location: [facility];  Service: Ophthalmology;  Laterality: Right;  . CATARACT EXTRACTION W/PHACO Left 04/27/2019   Procedure: CATARACT EXTRACTION PHACO AND INTRAOCULAR LENS PLACEMENT (IOC) LEFT 1.46  00:20.3;  Surgeon: Nevada Crane, MD;  Location: [facility];  Service: Ophthalmology;  Laterality: Left;  . TUBAL LIGATION      Current Outpatient Medications:  .  acetaminophen (TYLENOL) 500 MG tablet, Take 500 mg by mouth every 8 (eight) hours as needed., Disp: , Rfl:  .  benzonatate (TESSALON) 200 MG capsule, Take 1 capsule (200 mg total) by mouth 3 (three) times daily as needed for cough., Disp: 60 capsule, Rfl: 0 .  carisoprodol (SOMA) 350 MG tablet, Take 1 tablet (350 mg total) by mouth 3 (three) times daily as needed for muscle spasms., Disp: 30 tablet, Rfl: 0 .  omeprazole (PRILOSEC) 40 MG capsule, Take 1 capsule (40 mg total) by mouth daily., Disp: 90 capsule, Rfl: 3 .  Na Sulfate-K Sulfate-Mg Sulf 17.5-3.13-1.6 GM/177ML SOLN, Take 354 mLs by mouth once for 1 dose., Disp: 354 mL, Rfl: 0   Family History  Problem Relation Age of Onset  . Coronary artery disease Mother   . Heart disease Mother   . Hypertension Mother   . Stroke Father   . Drug abuse Brother      Social History   Tobacco Use  . Smoking status: Former Smoker    Packs/day: 0.30    Years: 40.00    Pack years: 12.00    Types: Cigarettes    Quit date: 04/16/2006    Years since quitting: 13.4  . Smokeless tobacco: Never Used  Vaping Use  . Vaping Use: Never used  Substance Use Topics  . Alcohol use: No  . Drug use: No    Allergies as of 09/29/2019   . (No Known Allergies)    Review of Systems:    All systems reviewed and negative except where noted in HPI.   Physical Exam:  BP (!) 152/62 (BP Location: Left Arm, Patient Position: Sitting, Cuff Size: Normal)   Pulse 70   Temp (!) 97.3 F (36.3 C) (Oral)   Ht 5\' 5"  (1.651 m)   Wt 133 lb (60.3 kg)   BMI 22.13 kg/m  No LMP recorded. Patient is postmenopausal.  General:   Alert,  Well-developed, well-nourished, pleasant and cooperative in NAD Head:  Normocephalic and atraumatic. Eyes:  Sclera clear, no icterus.   Conjunctiva pink. Ears:  Normal auditory acuity. Nose:  No deformity, discharge, or lesions. Mouth:  No deformity or lesions,oropharynx pink & moist. Neck:  Supple; no masses or thyromegaly. Lungs:  Respirations even and unlabored.  Clear throughout to auscultation.   No wheezes, crackles, or rhonchi. No acute distress. Heart:  Regular rate and rhythm; no murmurs, clicks, rubs, or gallops. Abdomen:  Normal bowel sounds. Soft, non-tender and non-distended without masses, hepatosplenomegaly or hernias noted.  No guarding or rebound tenderness.   Rectal: Not performed Msk:  Symmetrical without gross deformities. Good, equal movement & strength bilaterally. Pulses:  Normal pulses noted. Extremities:  No clubbing or edema.  No cyanosis. Neurologic:  Alert and oriented x3;  grossly normal neurologically. Skin:  Intact without significant lesions or rashes. No jaundice. Psych:  Alert and cooperative. Normal mood and affect.  Imaging Studies: No abdominal imaging  Assessment and Plan:   Meghan Moody is a 74 y.o. female with chronic GERD, chronic iron deficiency anemia with recent worsening  Acute on chronic iron deficiency anemia Recommend EGD and colonoscopy for further evaluation If unremarkable, recommend video capsule endoscopy Follow-up with Dr. Grayland Ormond for parenteral iron therapy  Chronic GERD Recommend EGD for further evaluation Continue omeprazole 40 mg  daily before meals Continue antireflux lifestyle   Follow up in 3 months   Cephas Darby, MD

## 2019-10-01 ENCOUNTER — Encounter: Payer: Self-pay | Admitting: Oncology

## 2019-10-01 NOTE — Progress Notes (Signed)
Patient prescreened for appointment. Patient has no concerns or questions.  

## 2019-10-02 ENCOUNTER — Inpatient Hospital Stay (HOSPITAL_BASED_OUTPATIENT_CLINIC_OR_DEPARTMENT_OTHER): Payer: Medicare HMO | Admitting: Oncology

## 2019-10-02 ENCOUNTER — Inpatient Hospital Stay: Payer: Medicare HMO

## 2019-10-02 ENCOUNTER — Other Ambulatory Visit: Payer: Self-pay

## 2019-10-02 ENCOUNTER — Inpatient Hospital Stay: Payer: Medicare HMO | Attending: Oncology

## 2019-10-02 DIAGNOSIS — E785 Hyperlipidemia, unspecified: Secondary | ICD-10-CM | POA: Insufficient documentation

## 2019-10-02 DIAGNOSIS — I10 Essential (primary) hypertension: Secondary | ICD-10-CM | POA: Diagnosis not present

## 2019-10-02 DIAGNOSIS — D509 Iron deficiency anemia, unspecified: Secondary | ICD-10-CM

## 2019-10-02 DIAGNOSIS — K219 Gastro-esophageal reflux disease without esophagitis: Secondary | ICD-10-CM | POA: Diagnosis not present

## 2019-10-02 DIAGNOSIS — Z79899 Other long term (current) drug therapy: Secondary | ICD-10-CM | POA: Insufficient documentation

## 2019-10-02 DIAGNOSIS — Z87891 Personal history of nicotine dependence: Secondary | ICD-10-CM | POA: Diagnosis not present

## 2019-10-02 LAB — CBC WITH DIFFERENTIAL/PLATELET
Abs Immature Granulocytes: 0.02 10*3/uL (ref 0.00–0.07)
Basophils Absolute: 0.1 10*3/uL (ref 0.0–0.1)
Basophils Relative: 1 %
Eosinophils Absolute: 0.1 10*3/uL (ref 0.0–0.5)
Eosinophils Relative: 1 %
HCT: 39.2 % (ref 36.0–46.0)
Hemoglobin: 11.7 g/dL — ABNORMAL LOW (ref 12.0–15.0)
Immature Granulocytes: 0 %
Lymphocytes Relative: 18 %
Lymphs Abs: 1.2 10*3/uL (ref 0.7–4.0)
MCH: 24.2 pg — ABNORMAL LOW (ref 26.0–34.0)
MCHC: 29.8 g/dL — ABNORMAL LOW (ref 30.0–36.0)
MCV: 81.2 fL (ref 80.0–100.0)
Monocytes Absolute: 0.4 10*3/uL (ref 0.1–1.0)
Monocytes Relative: 7 %
Neutro Abs: 4.9 10*3/uL (ref 1.7–7.7)
Neutrophils Relative %: 73 %
Platelets: 260 10*3/uL (ref 150–400)
RBC: 4.83 MIL/uL (ref 3.87–5.11)
WBC: 6.8 10*3/uL (ref 4.0–10.5)
nRBC: 0 % (ref 0.0–0.2)

## 2019-10-02 LAB — IRON AND TIBC
Iron: 37 ug/dL (ref 28–170)
Saturation Ratios: 8 % — ABNORMAL LOW (ref 10.4–31.8)
TIBC: 470 ug/dL — ABNORMAL HIGH (ref 250–450)
UIBC: 433 ug/dL

## 2019-10-02 LAB — FERRITIN: Ferritin: 10 ng/mL — ABNORMAL LOW (ref 11–307)

## 2019-10-29 ENCOUNTER — Other Ambulatory Visit
Admission: RE | Admit: 2019-10-29 | Discharge: 2019-10-29 | Disposition: A | Discharge status: Home / Self Care | Payer: Medicare HMO | Source: Ambulatory Visit | Attending: Gastroenterology | Admitting: Gastroenterology

## 2019-10-29 ENCOUNTER — Other Ambulatory Visit: Payer: Self-pay

## 2019-10-29 DIAGNOSIS — Z20822 Contact with and (suspected) exposure to covid-19: Secondary | ICD-10-CM | POA: Insufficient documentation

## 2019-10-29 LAB — SARS CORONAVIRUS 2 (TAT 6-24 HRS): SARS Coronavirus 2: NEGATIVE

## 2019-11-02 ENCOUNTER — Encounter
Admission: RE | Disposition: A | Discharge status: Home / Self Care | Payer: Self-pay | Source: Home / Self Care | Attending: Gastroenterology

## 2019-11-02 ENCOUNTER — Ambulatory Visit: Payer: Medicare HMO | Admitting: Anesthesiology

## 2019-11-02 ENCOUNTER — Encounter: Payer: Self-pay | Admitting: Gastroenterology

## 2019-11-02 ENCOUNTER — Ambulatory Visit
Admission: RE | Admit: 2019-11-02 | Discharge: 2019-11-02 | Disposition: A | Discharge status: Home / Self Care | Payer: Medicare HMO | Attending: Gastroenterology | Admitting: Gastroenterology

## 2019-11-02 DIAGNOSIS — K449 Diaphragmatic hernia without obstruction or gangrene: Secondary | ICD-10-CM | POA: Insufficient documentation

## 2019-11-02 DIAGNOSIS — H9192 Unspecified hearing loss, left ear: Secondary | ICD-10-CM | POA: Diagnosis not present

## 2019-11-02 DIAGNOSIS — K2950 Unspecified chronic gastritis without bleeding: Secondary | ICD-10-CM | POA: Insufficient documentation

## 2019-11-02 DIAGNOSIS — Z79899 Other long term (current) drug therapy: Secondary | ICD-10-CM | POA: Diagnosis not present

## 2019-11-02 DIAGNOSIS — K219 Gastro-esophageal reflux disease without esophagitis: Secondary | ICD-10-CM | POA: Diagnosis not present

## 2019-11-02 DIAGNOSIS — Z87891 Personal history of nicotine dependence: Secondary | ICD-10-CM | POA: Insufficient documentation

## 2019-11-02 DIAGNOSIS — D509 Iron deficiency anemia, unspecified: Secondary | ICD-10-CM | POA: Insufficient documentation

## 2019-11-02 DIAGNOSIS — K644 Residual hemorrhoidal skin tags: Secondary | ICD-10-CM | POA: Insufficient documentation

## 2019-11-02 DIAGNOSIS — I10 Essential (primary) hypertension: Secondary | ICD-10-CM | POA: Diagnosis not present

## 2019-11-02 DIAGNOSIS — K5790 Diverticulosis of intestine, part unspecified, without perforation or abscess without bleeding: Secondary | ICD-10-CM | POA: Diagnosis not present

## 2019-11-02 DIAGNOSIS — J42 Unspecified chronic bronchitis: Secondary | ICD-10-CM | POA: Diagnosis not present

## 2019-11-02 DIAGNOSIS — K5730 Diverticulosis of large intestine without perforation or abscess without bleeding: Secondary | ICD-10-CM | POA: Insufficient documentation

## 2019-11-02 DIAGNOSIS — E785 Hyperlipidemia, unspecified: Secondary | ICD-10-CM | POA: Diagnosis not present

## 2019-11-02 DIAGNOSIS — K649 Unspecified hemorrhoids: Secondary | ICD-10-CM | POA: Diagnosis not present

## 2019-11-02 HISTORY — DX: Other specified postprocedural states: Z98.890

## 2019-11-02 HISTORY — PX: COLONOSCOPY WITH PROPOFOL: SHX5780

## 2019-11-02 HISTORY — PX: ESOPHAGOGASTRODUODENOSCOPY (EGD) WITH PROPOFOL: SHX5813

## 2019-11-02 SURGERY — COLONOSCOPY WITH PROPOFOL
Anesthesia: General

## 2019-11-02 MED ORDER — PROPOFOL 500 MG/50ML IV EMUL
INTRAVENOUS | Status: DC | PRN
  Administered 2019-11-02: 50 ug/kg/min via INTRAVENOUS

## 2019-11-02 MED ORDER — PROPOFOL 500 MG/50ML IV EMUL
INTRAVENOUS | Status: AC
  Filled 2019-11-02: qty 50

## 2019-11-02 MED ORDER — LIDOCAINE HCL (CARDIAC) PF 100 MG/5ML IV SOSY
PREFILLED_SYRINGE | INTRAVENOUS | Status: DC | PRN
  Administered 2019-11-02: 50 mg via INTRAVENOUS

## 2019-11-02 MED ORDER — SODIUM CHLORIDE 0.9 % IV SOLN: INTRAVENOUS | Status: DC

## 2019-11-02 MED ORDER — PROPOFOL 10 MG/ML IV BOLUS
INTRAVENOUS | Status: DC | PRN
  Administered 2019-11-02: 20 mg via INTRAVENOUS
  Administered 2019-11-02: 30 mg via INTRAVENOUS
  Administered 2019-11-02: 20 mg via INTRAVENOUS

## 2019-11-02 NOTE — H&P (Signed)
[name], MD [address]  [address]  [place], [place] [address]  Main: [phone]  Fax: [phone] Pager: [phone]  Primary Care Physician:  [name], MD Primary Gastroenterologist:  Dr. [name]  Pre-Procedure History & Physical: HPI:  Meghan Moody is a 74 y.o. female is here for an endoscopy and colonoscopy.   Past Medical History:  Diagnosis Date   Anemia    Chronic bronchitis (HCC)    Chronic cough    Deafness in left ear    GERD (gastroesophageal reflux disease)    H/O hemorrhoidectomy    Hiatal hernia    Hyperlipidemia    Hypertension    SOB (shortness of breath) on exertion     Past Surgical History:  Procedure Laterality Date   BREAST CYST ASPIRATION Bilateral    neg   BREAST EXCISIONAL BIOPSY Right 1978   neg   BUNIONECTOMY     CATARACT EXTRACTION W/PHACO Right 03/30/2019   Procedure: CATARACT EXTRACTION PHACO AND INTRAOCULAR LENS PLACEMENT (IOC) RIGHT, 1.23, 00:20.8;  Surgeon: Nevada Crane, MD;  Location: [facility];  Service: Ophthalmology;  Laterality: Right;   CATARACT EXTRACTION W/PHACO Left 04/27/2019   Procedure: CATARACT EXTRACTION PHACO AND INTRAOCULAR LENS PLACEMENT (IOC) LEFT 1.46  00:20.3;  Surgeon: Nevada Crane, MD;  Location: [facility];  Service: Ophthalmology;  Laterality: Left;   TUBAL LIGATION      Prior to Admission medications   Medication Sig Start Date End Date Taking? Authorizing Provider  acetaminophen (TYLENOL) 500 MG tablet Take 500 mg by mouth every 8 (eight) hours as needed.   Yes [provider]  omeprazole (PRILOSEC) 40 MG capsule Take 1 capsule (40 mg total) by mouth daily. 08/07/19  Yes [name], MD  benzonatate (TESSALON) 200 MG capsule Take 1 capsule (200 mg total) by mouth 3 (three) times daily as needed for cough. Patient not taking: Reported on 10/01/2019 08/07/19   [name], MD  carisoprodol (SOMA) 350 MG tablet Take 1  tablet (350 mg total) by mouth 3 (three) times daily as needed for muscle spasms. 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   Would probably not accept tube feeds   Social Determinants of Health   Financial Resource Strain:    Difficulty of Paying Living Expenses:   Food Insecurity:    Worried About Programme researcher, broadcasting/film/video in the Last Year:    Barista in the Last Year:   Transportation Needs:    Freight forwarder (Medical):    Lack of Transportation (Non-Medical):   Physical Activity:    Days of Exercise per Week:    Minutes of Exercise per Session:   Stress:    Feeling of Stress :   Social Connections:    Frequency of Communication with Friends and Family:    Frequency of Social Gatherings with Friends and Family:    Attends Religious  Services:    Active Member of Clubs or Organizations:    Attends Engineer, structural:    Marital Status:   Intimate Partner Violence:    Fear of Current or Ex-Partner:    Emotionally Abused:    Physically Abused:    Sexually Abused:     Review of Systems: See HPI, otherwise negative ROS  Physical Exam: BP (!) 174/84    Pulse 63    Temp 97.6 F (36.4 C) (Temporal)    Resp 18    Ht 5\' 5"  (1.651 m)    Wt 59.9 kg    SpO2 100%    BMI 21.97 kg/m  General:   Alert,  pleasant and cooperative in NAD Head:  Normocephalic and atraumatic. Neck:  Supple; no masses or thyromegaly. Lungs:  Clear throughout to auscultation.    Heart:  Regular rate and rhythm. Abdomen:  Soft, nontender and nondistended. Normal bowel sounds, without guarding, and without rebound.   Neurologic:  Alert and  oriented x4;  grossly normal neurologically.  Impression/Plan: is here for an endoscopy and colonoscopy to be performed for IDA  Risks, benefits, limitations, and alternatives regarding  endoscopy and colonoscopy have been reviewed with the patient.  Questions have been answered.  All parties agreeable.   Theodore Demark, MD  11/02/2019, 11:49 AM

## 2019-11-02 NOTE — Transfer of Care (Signed)
Immediate Anesthesia Transfer of Care Note  Patient: Meghan Moody  Procedure(s) Performed: COLONOSCOPY WITH PROPOFOL (N/A ) ESOPHAGOGASTRODUODENOSCOPY (EGD) WITH PROPOFOL (N/A )  Patient Location: PACU  Anesthesia Type:General  Level of Consciousness: sedated  Airway & Oxygen Therapy: Patient Spontanous Breathing and Patient connected to nasal cannula oxygen  Post-op Assessment: Report given to RN and Post -op Vital signs reviewed and stable  Post vital signs: Reviewed and stable  Last Vitals:  Vitals Value Taken Time  BP 119/52 11/02/19 1318  Temp    Pulse 56 11/02/19 1318  Resp 12 11/02/19 1318  SpO2 98 % 11/02/19 1318  Vitals shown include unvalidated device data.  Last Pain:  Vitals:   11/02/19 1318  TempSrc:   PainSc: Asleep         Complications: No complications documented.

## 2019-11-02 NOTE — Op Note (Signed)
[facility] Gastroenterology Patient Name: Meghan Moody Procedure Date: 11/02/2019 12:40 PM MRN: [id number] Account #: [id number] Date of Birth: 02/26/1946 Admit Type: Outpatient Age: 74 Room: [facility] ENDO ROOM 1 Gender: Female Note Status: Finalized Procedure:             Colonoscopy Indications:           Unexplained iron deficiency anemia Providers:             Lin Landsman MD, MD Referring MD:          Venia Carbon (Referring MD) Medicines:             Monitored Anesthesia Care Complications:         No immediate complications. Estimated blood loss: None. Procedure:             Pre-Anesthesia Assessment:                        - Prior to the procedure, a History and Physical was                         performed, and patient medications and allergies were                         reviewed. The patient is competent. The risks and                         benefits of the procedure and the sedation options and                         risks were discussed with the patient. All questions                         were answered and informed consent was obtained.                         Patient identification and proposed procedure were                         verified by the physician, the nurse, the                         anesthesiologist, the anesthetist and the technician                         in the pre-procedure area in the procedure room in the                         endoscopy suite. Mental Status Examination: alert and                         oriented. Airway Examination: normal oropharyngeal                         airway and neck mobility. Respiratory Examination:                         clear to auscultation. CV Examination: normal.  Prophylactic Antibiotics: The patient does not require                         prophylactic antibiotics. Prior Anticoagulants: The                         patient has taken no previous  anticoagulant or                         antiplatelet agents. ASA Grade Assessment: III - A                         patient with severe systemic disease. After reviewing                         the risks and benefits, the patient was deemed in                         satisfactory condition to undergo the procedure. The                         anesthesia plan was to use monitored anesthesia care                         (MAC). Immediately prior to administration of                         medications, the patient was re-assessed for adequacy                         to receive sedatives. The heart rate, respiratory                         rate, oxygen saturations, blood pressure, adequacy of                         pulmonary ventilation, and response to care were                         monitored throughout the procedure. The physical                         status of the patient was re-assessed after the                         procedure.                        After obtaining informed consent, the colonoscope was                         passed under direct vision. Throughout the procedure,                         the patient's blood pressure, pulse, and oxygen                         saturations were monitored continuously. The  Colonoscope was introduced through the anus and                         advanced to the the terminal ileum, with                         identification of the appendiceal orifice and IC                         valve. The colonoscopy was performed without                         difficulty. The patient tolerated the procedure well.                         The quality of the bowel preparation was evaluated                         using the BBPS [facility] Bowel Preparation Scale) with                         scores of: Right Colon = 3, Transverse Colon = 3 and                         Left Colon = 3 (entire mucosa seen well with no                          residual staining, small fragments of stool or opaque                         liquid). The total BBPS score equals 9. Findings:      The perianal and digital rectal examinations were normal. Pertinent       negatives include normal sphincter tone and no palpable rectal lesions.      The terminal ileum appeared normal.      Multiple diverticula were found in the sigmoid colon, descending colon       and transverse colon.      Non-bleeding external hemorrhoids were found during retroflexion. The       hemorrhoids were medium-sized.      The exam was otherwise without abnormality. Impression:            - The examined portion of the ileum was normal.                        - Diverticulosis in the sigmoid colon, in the                         descending colon and in the transverse colon.                        - Non-bleeding external hemorrhoids.                        - The examination was otherwise normal.                        - No specimens collected. Recommendation:        -  Discharge patient to home (with escort).                        - Resume previous diet today.                        - Continue present medications.                        - To visualize the small bowel, perform video capsule                         endoscopy at appointment to be scheduled.                        - Follow up with Dr Grayland Ormond for parenteral iron                         therapy Procedure Code(s):     --- Professional ---                        [phone], Colonoscopy, flexible; diagnostic, including                         collection of specimen(s) by brushing or washing, when                         performed (separate procedure) Diagnosis Code(s):     --- Professional ---                        K64.4, Residual hemorrhoidal skin tags                        D50.9, Iron deficiency anemia, unspecified                        K57.30, Diverticulosis of large intestine without                          perforation or abscess without bleeding CPT copyright 2019 American Medical Association. All rights reserved. The codes documented in this report are preliminary and upon coder review may  be revised to meet current compliance requirements. Dr. Ulyess Mort Lin Landsman MD, MD 11/02/2019 1:17:31 PM This report has been signed electronically. Number of Addenda: 0 Note Initiated On: 11/02/2019 12:40 PM Scope Withdrawal Time: 0 hours 6 minutes 27 seconds  Total Procedure Duration: 0 hours 10 minutes 50 seconds  Estimated Blood Loss:  Estimated blood loss: none.      [facility]

## 2019-11-02 NOTE — Op Note (Signed)
[facility] Gastroenterology Patient Name: Meghan Moody Procedure Date: 11/02/2019 12:41 PM MRN: [id number] Account #: [id number] Date of Birth: 15-Apr-1946 Admit Type: Outpatient Age: 74 Room: [facility] ENDO ROOM 1 Gender: Female Note Status: Finalized Procedure:             Upper GI endoscopy Indications:           Unexplained iron deficiency anemia Providers:             Lin Landsman MD, MD Referring MD:          Venia Carbon (Referring MD) Medicines:             Monitored Anesthesia Care Complications:         No immediate complications. Estimated blood loss: None. Procedure:             Pre-Anesthesia Assessment:                        - Prior to the procedure, a History and Physical was                         performed, and patient medications and allergies were                         reviewed. The patient is competent. The risks and                         benefits of the procedure and the sedation options and                         risks were discussed with the patient. All questions                         were answered and informed consent was obtained.                         Patient identification and proposed procedure were                         verified by the physician, the nurse, the                         anesthesiologist, the anesthetist and the technician                         in the pre-procedure area in the procedure room in the                         endoscopy suite. Mental Status Examination: alert and                         oriented. Airway Examination: normal oropharyngeal                         airway and neck mobility. Respiratory Examination:                         clear to auscultation. CV Examination: normal.  Prophylactic Antibiotics: The patient does not require                         prophylactic antibiotics. Prior Anticoagulants: The                         patient has taken no previous  anticoagulant or                         antiplatelet agents. ASA Grade Assessment: III - A                         patient with severe systemic disease. After reviewing                         the risks and benefits, the patient was deemed in                         satisfactory condition to undergo the procedure. The                         anesthesia plan was to use monitored anesthesia care                         (MAC). Immediately prior to administration of                         medications, the patient was re-assessed for adequacy                         to receive sedatives. The heart rate, respiratory                         rate, oxygen saturations, blood pressure, adequacy of                         pulmonary ventilation, and response to care were                         monitored throughout the procedure. The physical                         status of the patient was re-assessed after the                         procedure.                        After obtaining informed consent, the endoscope was                         passed under direct vision. Throughout the procedure,                         the patient's blood pressure, pulse, and oxygen                         saturations were monitored continuously. The Endoscope  was introduced through the mouth, and advanced to the                         second part of duodenum. The upper GI endoscopy was                         accomplished without difficulty. The patient tolerated                         the procedure fairly well. Findings:      The duodenal bulb and second portion of the duodenum were normal.       Biopsies for histology were taken with a cold forceps for evaluation of       celiac disease.      A 7 cm hiatal hernia was found. The proximal extent of the gastric folds       (end of tubular esophagus) was 30 cm from the incisors. The hiatal       narrowing was 37 cm from the incisors. The  Z-line was 30 cm from the       incisors.      The entire examined stomach was normal. Biopsies were taken with a cold       forceps for Helicobacter pylori testing.      The cardia and gastric fundus were normal on retroflexion.      The gastroesophageal junction and examined esophagus were normal. Impression:            - Normal duodenal bulb and second portion of the                         duodenum. Biopsied.                        - 7 cm hiatal hernia.                        - Normal stomach. Biopsied.                        - Normal gastroesophageal junction and esophagus. Recommendation:        - Await pathology results.                        - Proceed with colonoscopy as scheduled                        See colonoscopy report                        - Follow an antireflux regimen. Procedure Code(s):     --- Professional ---                        [phone], Esophagogastroduodenoscopy, flexible,                         transoral; with biopsy, single or multiple Diagnosis Code(s):     --- Professional ---                        K44.9, Diaphragmatic hernia without obstruction or  gangrene                        D50.9, Iron deficiency anemia, unspecified CPT copyright 2019 American Medical Association. All rights reserved. The codes documented in this report are preliminary and upon coder review may  be revised to meet current compliance requirements. Dr. Ulyess Mort Lin Landsman MD, MD 11/02/2019 1:00:35 PM This report has been signed electronically. Number of Addenda: 0 Note Initiated On: 11/02/2019 12:41 PM Estimated Blood Loss:  Estimated blood loss: none.      [facility]

## 2019-11-02 NOTE — Anesthesia Preprocedure Evaluation (Signed)
Anesthesia Evaluation  Patient identified by MRN, date of birth, ID band Patient awake    Reviewed: Allergy & Precautions, H&P , NPO status , Patient's Chart, lab work & pertinent test results, reviewed documented beta blocker date and time   History of Anesthesia Complications Negative for: history of anesthetic complications  Airway Mallampati: II  TM Distance: <3 FB Neck ROM: limited    Dental  (+) Chipped   Pulmonary shortness of breath and with exertion, COPD (chronic bronchitis), former smoker,    Pulmonary exam normal        Cardiovascular Exercise Tolerance: Good hypertension, (-) angina(-) Past MI Normal cardiovascular exam     Neuro/Psych PSYCHIATRIC DISORDERS negative neurological ROS     GI/Hepatic Neg liver ROS, hiatal hernia, GERD  Medicated and Controlled,  Endo/Other  negative endocrine ROS  Renal/GU negative Renal ROS  negative genitourinary   Musculoskeletal   Abdominal   Peds  Hematology negative hematology ROS (+)   Anesthesia Other Findings Past Medical History: No date: Anemia No date: Chronic bronchitis (HCC) No date: Chronic cough No date: Deafness in left ear No date: GERD (gastroesophageal reflux disease) No date: H/O hemorrhoidectomy No date: Hiatal hernia No date: Hyperlipidemia No date: Hypertension No date: SOB (shortness of breath) on exertion   Reproductive/Obstetrics negative OB ROS                             Anesthesia Physical  Anesthesia Plan  ASA: III  Anesthesia Plan: General   Post-op Pain Management:    Induction: Intravenous  PONV Risk Score and Plan: Propofol infusion and TIVA  Airway Management Planned: Natural Airway and Nasal Cannula  Additional Equipment:   Intra-op Plan:   Post-operative Plan:   Informed Consent: I have reviewed the patients History and Physical, chart, labs and discussed the procedure including the  risks, benefits and alternatives for the proposed anesthesia with the patient or authorized representative who has indicated his/her understanding and acceptance.     Dental Advisory Given  Plan Discussed with: Anesthesiologist, CRNA and Surgeon  Anesthesia Plan Comments: (Patient consented for risks of anesthesia including but not limited to:  - adverse reactions to medications - risk of intubation if required - damage to eyes, teeth, lips or other oral mucosa - nerve damage due to positioning  - sore throat or hoarseness - Damage to heart, brain, nerves, lungs, other parts of body or loss of life  Patient voiced understanding.)        Anesthesia Quick Evaluation

## 2019-11-03 ENCOUNTER — Encounter: Payer: Self-pay | Admitting: Gastroenterology

## 2019-11-03 LAB — SURGICAL PATHOLOGY

## 2019-11-03 NOTE — Anesthesia Postprocedure Evaluation (Signed)
Anesthesia Post Note  Patient: ISATU MACINNES  Procedure(s) Performed: COLONOSCOPY WITH PROPOFOL (N/A ) ESOPHAGOGASTRODUODENOSCOPY (EGD) WITH PROPOFOL (N/A )  Patient location during evaluation: Endoscopy Anesthesia Type: General Level of consciousness: awake and alert Pain management: pain level controlled Vital Signs Assessment: post-procedure vital signs reviewed and stable Respiratory status: spontaneous breathing, nonlabored ventilation, respiratory function stable and patient connected to nasal cannula oxygen Cardiovascular status: blood pressure returned to baseline and stable Postop Assessment: no apparent nausea or vomiting Anesthetic complications: no   No complications documented.   Last Vitals:  Vitals:   11/02/19 1328 11/02/19 1338  BP: 133/61 (!) 167/63  Pulse: (!) 54 (!) 49  Resp: 18 16  Temp:    SpO2: 98% 99%    Last Pain:  Vitals:   11/02/19 1338  TempSrc:   PainSc: 0-No pain                 Cleda Mccreedy Raziah Funnell

## 2019-11-04 ENCOUNTER — Encounter: Payer: Self-pay | Admitting: Gastroenterology

## 2019-11-20 ENCOUNTER — Inpatient Hospital Stay: Payer: Medicare HMO | Attending: Oncology

## 2019-11-20 ENCOUNTER — Other Ambulatory Visit: Payer: Self-pay

## 2019-11-20 DIAGNOSIS — R5383 Other fatigue: Secondary | ICD-10-CM | POA: Diagnosis not present

## 2019-11-20 DIAGNOSIS — D509 Iron deficiency anemia, unspecified: Secondary | ICD-10-CM | POA: Diagnosis present

## 2019-11-20 DIAGNOSIS — I10 Essential (primary) hypertension: Secondary | ICD-10-CM | POA: Diagnosis not present

## 2019-11-20 DIAGNOSIS — K219 Gastro-esophageal reflux disease without esophagitis: Secondary | ICD-10-CM | POA: Insufficient documentation

## 2019-11-20 DIAGNOSIS — R531 Weakness: Secondary | ICD-10-CM | POA: Diagnosis not present

## 2019-11-20 DIAGNOSIS — K449 Diaphragmatic hernia without obstruction or gangrene: Secondary | ICD-10-CM | POA: Diagnosis not present

## 2019-11-20 DIAGNOSIS — Z87891 Personal history of nicotine dependence: Secondary | ICD-10-CM | POA: Insufficient documentation

## 2019-11-20 DIAGNOSIS — Z79899 Other long term (current) drug therapy: Secondary | ICD-10-CM | POA: Diagnosis not present

## 2019-11-20 DIAGNOSIS — E785 Hyperlipidemia, unspecified: Secondary | ICD-10-CM | POA: Diagnosis not present

## 2019-11-20 LAB — IRON AND TIBC
Iron: 30 ug/dL (ref 28–170)
Saturation Ratios: 6 % — ABNORMAL LOW (ref 10.4–31.8)
TIBC: 484 ug/dL — ABNORMAL HIGH (ref 250–450)
UIBC: 454 ug/dL

## 2019-11-20 LAB — FERRITIN: Ferritin: 5 ng/mL — ABNORMAL LOW (ref 11–307)

## 2019-11-20 NOTE — Progress Notes (Signed)
[facility]  [phone] [phone]  ID: Meghan Moody OB: 1946/03/30  MR#: [id number]  UYQ#:[id number]  Patient Care Team: [name], MD as PCP - General [name], MD as Referring Physician (Pediatrics) Jeralyn Ruths, MD as Consulting Physician (Oncology)  CHIEF COMPLAINT: Iron deficiency anemia.  INTERVAL HISTORY: Patient returns to clinic today for repeat laboratory work, further evaluation, and consideration of additional IV iron.  She recently underwent colonoscopy and endoscopy, but no significant pathology was noted.  She has increased weakness and fatigue, but otherwise feels well. She has no neurologic complaints.  She denies any recent fevers or illnesses.  She has a good appetite and denies weight loss.  She denies any chest pain, shortness of breath, cough, or hemoptysis.  She denies any nausea, vomiting, constipation, or diarrhea.  She has no melena or hematochezia.  She has no urinary complaints.  Patient offers no further specific complaints today.  REVIEW OF SYSTEMS:   Review of Systems  Constitutional: Positive for malaise/fatigue. Negative for fever and weight loss.  Respiratory: Negative.  Negative for cough, hemoptysis and shortness of breath.   Cardiovascular: Negative.  Negative for chest pain and leg swelling.  Gastrointestinal: Negative.  Negative for abdominal pain, blood in stool and melena.  Genitourinary: Negative.  Negative for hematuria.  Musculoskeletal: Negative.  Negative for back pain.  Skin: Negative.  Negative for rash.  Neurological: Positive for weakness. Negative for dizziness, focal weakness and headaches.  Psychiatric/Behavioral: Negative.  The patient is not nervous/anxious.     As per HPI. Otherwise, a complete review of systems is negative.  PAST MEDICAL HISTORY: Past Medical History:  Diagnosis Date  . Anemia   . Chronic bronchitis (HCC)   . Chronic cough   . Deafness in left  ear   . GERD (gastroesophageal reflux disease)   . H/O hemorrhoidectomy   . Hiatal hernia   . Hyperlipidemia   . Hypertension   . SOB (shortness of breath) on exertion     PAST SURGICAL HISTORY: Past Surgical History:  Procedure Laterality Date  . BREAST CYST ASPIRATION Bilateral    neg  . BREAST EXCISIONAL BIOPSY Right 1978   neg  . BUNIONECTOMY    . CATARACT EXTRACTION W/PHACO Right 03/30/2019   Procedure: CATARACT EXTRACTION PHACO AND INTRAOCULAR LENS PLACEMENT (IOC) RIGHT, 1.23, 00:20.8;  Surgeon: Nevada Crane, MD;  Location: [facility];  Service: Ophthalmology;  Laterality: Right;  . CATARACT EXTRACTION W/PHACO Left 04/27/2019   Procedure: CATARACT EXTRACTION PHACO AND INTRAOCULAR LENS PLACEMENT (IOC) LEFT 1.46  00:20.3;  Surgeon: Nevada Crane, MD;  Location: [facility];  Service: Ophthalmology;  Laterality: Left;  . COLONOSCOPY WITH PROPOFOL N/A 11/02/2019   Procedure: COLONOSCOPY WITH PROPOFOL;  Surgeon: Toney Reil, MD;  Location: [facility] END[facility]COPY;  Service: Gastroenterology;  Laterality: N/A;  . ESOPHAGOGASTRODUODENOSCOPY (EGD) WITH PROPOFOL N/A 11/02/2019   Procedure: ESOPHAGOGASTRODUODENOSCOPY (EGD) WITH PROPOFOL;  Surgeon: Toney Reil, MD;  Location: [facility] ENDOSCOPY;  Service: Gastroenterology;  Laterality: N/A;  . TUBAL LIGATION      FAMILY HISTORY: Family History  Problem Relation Age of Onset  . Coronary artery disease Mother   . Heart disease Mother   . Hypertension Mother   . Stroke Father   . Drug abuse Brother     ADVANCED DIRECTIVES (Y/N):  N  HEALTH MAINTENANCE: Social History   Tobacco Use  . Smoking status: Former Smoker    Packs/day: 0.30  Years: 40.00    Pack years: 12.00    Types: Cigarettes    Quit date: 04/16/2006    Years since quitting: 13.6  . Smokeless tobacco: Never Used  Vaping Use  . Vaping Use: Never used  Substance Use Topics  . Alcohol use: No  . Drug use: No      Colonoscopy:  PAP:  Bone density:  Lipid panel:  No Known Allergies  Current Outpatient Medications  Medication Sig Dispense Refill  . acetaminophen (TYLENOL) 500 MG tablet Take 500 mg by mouth every 8 (eight) hours as needed.    Marland Kitchen omeprazole (PRILOSEC) 40 MG capsule Take 1 capsule (40 mg total) by mouth daily. 90 capsule 3   No current facility-administered medications for this visit.    OBJECTIVE: Vitals:   11/23/19 1344  BP: (!) 156/79  Pulse: 64  Resp: 18  Temp: (!) 96.3 F (35.7 C)  SpO2: 100%     Body mass index is 22.13 kg/m.    ECOG FS:0 - Asymptomatic  General: Well-developed, well-nourished, no acute distress. Eyes: Pink conjunctiva, anicteric sclera. HEENT: Normocephalic, moist mucous membranes. Lungs: No audible wheezing or coughing. Heart: Regular rate and rhythm. Abdomen: Soft, nontender, no obvious distention. Musculoskeletal: No edema, cyanosis, or clubbing. Neuro: Alert, answering all questions appropriately. Cranial nerves grossly intact. Skin: No rashes or petechiae noted. Psych: Normal affect.   LAB RESULTS:  Lab Results  Component Value Date   NA 140 08/07/2019   K 4.9 08/07/2019   CL 106 08/07/2019   CO2 26 08/07/2019   GLUCOSE 99 08/07/2019   BUN 19 08/07/2019   CREATININE 1.23 (H) 08/07/2019   CALCIUM 9.8 08/07/2019   PROT 6.6 08/07/2019   ALBUMIN 4.4 08/07/2019   AST 11 08/07/2019   ALT 9 08/07/2019   ALKPHOS 84 08/07/2019   BILITOT 0.4 08/07/2019   GFRNONAA 42 (L) 08/29/2015   GFRAA 49 (L) 08/29/2015    Lab Results  Component Value Date   WBC 6.8 10/02/2019   NEUTROABS 4.9 10/02/2019   HGB 11.7 (L) 10/02/2019   HCT 39.2 10/02/2019   MCV 81.2 10/02/2019   PLT 260 10/02/2019   Lab Results  Component Value Date   IRON 30 11/20/2019   TIBC 484 (H) 11/20/2019   IRONPCTSAT 6 (L) 11/20/2019   Lab Results  Component Value Date   FERRITIN 5 (L) 11/20/2019     STUDIES: No results found.  ASSESSMENT: Iron  deficiency anemia.  PLAN:    1.  Iron deficiency anemia: Patient CBC was not resulted, but her iron stores remain significantly decreased and she is mildly symptomatic.  Colonoscopy and EGD on November 02, 2019 did not reveal any significant pathology.  Previously, the remainder of her laboratory work was either negative or within normal limits.  Proceed with 200 mg IV Venofer today.  Return to clinic in 1 and 2 weeks to receive second infusion.  Patient will then return to clinic in 3 months for further evaluation and continuation of iron if needed.    I spent a total of 30 minutes reviewing chart data, face-to-face evaluation with the patient, counseling and coordination of care as detailed above.   Patient expressed understanding and was in agreement with this plan. She also understands that She can call clinic at any time with any questions, concerns, or complaints.     Jeralyn Ruths, MD   11/24/2019 6:49 AM

## 2019-11-23 ENCOUNTER — Encounter: Payer: Self-pay | Admitting: Oncology

## 2019-11-23 ENCOUNTER — Inpatient Hospital Stay (HOSPITAL_BASED_OUTPATIENT_CLINIC_OR_DEPARTMENT_OTHER): Payer: Medicare HMO | Admitting: Oncology

## 2019-11-23 ENCOUNTER — Inpatient Hospital Stay: Payer: Medicare HMO

## 2019-11-23 ENCOUNTER — Other Ambulatory Visit: Payer: Self-pay

## 2019-11-23 VITALS — BP 156/79 | HR 64 | Temp 96.3°F | Resp 18 | Wt 133.0 lb

## 2019-11-23 DIAGNOSIS — D509 Iron deficiency anemia, unspecified: Secondary | ICD-10-CM | POA: Diagnosis not present

## 2019-11-23 MED ORDER — SODIUM CHLORIDE 0.9 % IV SOLN
Freq: Once | INTRAVENOUS | Status: AC
  Filled 2019-11-23: qty 250

## 2019-11-23 MED ORDER — IRON SUCROSE 20 MG/ML IV SOLN
200.0000 mg | Freq: Once | INTRAVENOUS | Status: AC
  Administered 2019-11-23: 200 mg via INTRAVENOUS
  Filled 2019-11-23: qty 10

## 2019-11-23 NOTE — Progress Notes (Signed)
Pt in for follow up, reports being more tired and short of breath walking short distances.

## 2019-11-30 ENCOUNTER — Inpatient Hospital Stay: Payer: Medicare HMO

## 2019-11-30 ENCOUNTER — Telehealth: Payer: Self-pay

## 2019-11-30 ENCOUNTER — Other Ambulatory Visit: Payer: Self-pay

## 2019-11-30 VITALS — BP 165/70 | HR 58 | Temp 97.9°F | Resp 18

## 2019-11-30 DIAGNOSIS — D509 Iron deficiency anemia, unspecified: Secondary | ICD-10-CM | POA: Diagnosis not present

## 2019-11-30 MED ORDER — IRON SUCROSE 20 MG/ML IV SOLN
200.0000 mg | Freq: Once | INTRAVENOUS | Status: AC
  Administered 2019-11-30: 200 mg via INTRAVENOUS
  Filled 2019-11-30: qty 10

## 2019-11-30 MED ORDER — SODIUM CHLORIDE 0.9 % IV SOLN
Freq: Once | INTRAVENOUS | Status: AC
  Filled 2019-11-30: qty 250

## 2019-11-30 NOTE — Telephone Encounter (Signed)
Pt left v/m that pt was at [facility] center and the [place] center doctor recommended that pt contact PCP about BP being elevated slightly; today at [facility] BP was 165/70 P 58 and pt said having difficulty with hgb. Pt wants to know if she should start BP med again. Pt last seen [facility] on 08/07/19 and at that visit BP 116/74 P 72. I called pt and got v/m and left v/m for pt to call 12/01/19 at 8 AM to [facility] or call tonight to speak with on call nurse if pt needed to do that. UC & ED precautions given as well. FYI to Dr Alphonsus Sias and Carollee Herter CMA.

## 2019-12-01 NOTE — Telephone Encounter (Signed)
She does seem to have persistent elevations lately. I think it would be a good idea to restart the BP med (HCTZ/triamterene). Send Rx if needed Set up follow up in 4-6 weeks to evaluate her response and have her let me know if any problems (like dizziness upon standing)

## 2019-12-01 NOTE — Telephone Encounter (Signed)
Spoke to pt. She will restart the medication. I have updated her med list. Made appt for 01-08-20.

## 2019-12-03 ENCOUNTER — Ambulatory Visit: Payer: Medicare HMO | Admitting: Internal Medicine

## 2019-12-07 ENCOUNTER — Inpatient Hospital Stay: Payer: Medicare HMO

## 2019-12-07 ENCOUNTER — Other Ambulatory Visit: Payer: Self-pay

## 2019-12-07 VITALS — BP 122/62 | HR 64 | Temp 96.1°F | Resp 18

## 2019-12-07 DIAGNOSIS — D509 Iron deficiency anemia, unspecified: Secondary | ICD-10-CM

## 2019-12-07 MED ORDER — IRON SUCROSE 20 MG/ML IV SOLN
200.0000 mg | Freq: Once | INTRAVENOUS | Status: AC
  Administered 2019-12-07: 200 mg via INTRAVENOUS
  Filled 2019-12-07: qty 10

## 2019-12-07 MED ORDER — SODIUM CHLORIDE 0.9 % IV SOLN
Freq: Once | INTRAVENOUS | Status: AC
  Filled 2019-12-07: qty 250

## 2020-01-01 ENCOUNTER — Other Ambulatory Visit: Payer: Medicare HMO

## 2020-01-04 ENCOUNTER — Ambulatory Visit: Payer: Medicare HMO

## 2020-01-04 ENCOUNTER — Ambulatory Visit: Payer: Medicare HMO | Admitting: Oncology

## 2020-01-08 ENCOUNTER — Other Ambulatory Visit: Payer: Self-pay

## 2020-01-08 ENCOUNTER — Encounter: Payer: Self-pay | Admitting: Internal Medicine

## 2020-01-08 ENCOUNTER — Ambulatory Visit (INDEPENDENT_AMBULATORY_CARE_PROVIDER_SITE_OTHER): Payer: Medicare HMO | Admitting: Internal Medicine

## 2020-01-08 VITALS — BP 122/80 | HR 69 | Temp 97.5°F | Ht 65.0 in | Wt 132.5 lb

## 2020-01-08 DIAGNOSIS — D500 Iron deficiency anemia secondary to blood loss (chronic): Secondary | ICD-10-CM | POA: Diagnosis not present

## 2020-01-08 DIAGNOSIS — Z23 Encounter for immunization: Secondary | ICD-10-CM

## 2020-01-08 DIAGNOSIS — I10 Essential (primary) hypertension: Secondary | ICD-10-CM

## 2020-01-08 DIAGNOSIS — K219 Gastro-esophageal reflux disease without esophagitis: Secondary | ICD-10-CM

## 2020-01-08 LAB — RENAL FUNCTION PANEL
Albumin: 4.5 g/dL (ref 3.5–5.2)
BUN: 31 mg/dL — ABNORMAL HIGH (ref 6–23)
CO2: 29 mEq/L (ref 19–32)
Calcium: 10 mg/dL (ref 8.4–10.5)
Chloride: 102 mEq/L (ref 96–112)
Creatinine, Ser: 1.09 mg/dL (ref 0.40–1.20)
GFR: 48.92 mL/min — ABNORMAL LOW (ref >60.00)
Glucose, Bld: 85 mg/dL (ref 70–99)
Phosphorus: 3.5 mg/dL (ref 2.3–4.6)
Potassium: 4.2 mEq/L (ref 3.5–5.1)
Sodium: 140 mEq/L (ref 135–145)

## 2020-01-08 LAB — CBC
HCT: 47.2 % — ABNORMAL HIGH (ref 36.0–46.0)
Hemoglobin: 15.1 g/dL — ABNORMAL HIGH (ref 12.0–15.0)
MCHC: 32 g/dL (ref 30.0–36.0)
MCV: 87.2 fl (ref 78.0–100.0)
Platelets: 232 10*3/uL (ref 150.0–400.0)
RBC: 5.41 Mil/uL — ABNORMAL HIGH (ref 3.87–5.11)
RDW: 19.7 % — ABNORMAL HIGH (ref 11.5–15.5)
WBC: 7.5 10*3/uL (ref 4.0–10.5)

## 2020-01-08 NOTE — Progress Notes (Signed)
Subjective:    Patient ID: Meghan Moody, female    DOB: 1945-04-23, 74 y.o.   MRN: [id number]  HPI Here for follow up of anemia and HTN This visit occurred during the SARS-CoV-2 public health emergency.  Safety protocols were in place, including screening questions prior to the visit, additional usage of staff PPE, and extensive cleaning of exam room while observing appropriate contact time as indicated for disinfecting solutions.   She is feeling much better---"you can not imagine!" Seeing Dr Orlie Dakin for this Transfusion really helped--and has had IV iron several times EGD and colonoscopy by Dr Nathanial Rancher source of bleeding Large hiatal hernia seen---taking omeprazole daily  She was monitoring her BP at hematology office Had initially been low--so off medication Some that were much higher after that (with weekly iron) Restarted the med 6 months--taking 1/2 only Checks her BP occasionally---has been okay  No headache No chest pain or SOB Has been walking the dog a lot---very fast. Stamina is fairly good No dizziness or syncope  Current Outpatient Medications on File Prior to Visit  Medication Sig Dispense Refill  . acetaminophen (TYLENOL) 500 MG tablet Take 500 mg by mouth every 8 (eight) hours as needed.    Marland Kitchen omeprazole (PRILOSEC) 40 MG capsule Take 1 capsule (40 mg total) by mouth daily. 90 capsule 3  . triamterene-hydrochlorothiazide (MAXZIDE-25) 37.5-25 MG tablet Take 0.5 tablets by mouth daily.     No current facility-administered medications on file prior to visit.    No Known Allergies  Past Medical History:  Diagnosis Date  . Anemia   . Chronic bronchitis (HCC)   . Chronic cough   . Deafness in left ear   . GERD (gastroesophageal reflux disease)   . H/O hemorrhoidectomy   . Hiatal hernia   . Hyperlipidemia   . Hypertension   . SOB (shortness of breath) on exertion     Past Surgical History:  Procedure Laterality Date  . BREAST CYST ASPIRATION Bilateral      neg  . BREAST EXCISIONAL BIOPSY Right 1978   neg  . BUNIONECTOMY    . CATARACT EXTRACTION W/PHACO Right 03/30/2019   Procedure: CATARACT EXTRACTION PHACO AND INTRAOCULAR LENS PLACEMENT (IOC) RIGHT, 1.23, 00:20.8;  Surgeon: Nevada Crane, MD;  Location: [facility];  Service: Ophthalmology;  Laterality: Right;  . CATARACT EXTRACTION W/PHACO Left 04/27/2019   Procedure: CATARACT EXTRACTION PHACO AND INTRAOCULAR LENS PLACEMENT (IOC) LEFT 1.46  00:20.3;  Surgeon: Nevada Crane, MD;  Location: [facility];  Service: Ophthalmology;  Laterality: Left;  . COLONOSCOPY WITH PROPOFOL N/A 11/02/2019   Procedure: COLONOSCOPY WITH PROPOFOL;  Surgeon: Toney Reil, MD;  Location: [facility];  Service: Gastroenterology;  Laterality: N/A;  . ESOPHAGOGASTRODUODENOSCOPY (EGD) WITH PROPOFOL N/A 11/02/2019   Procedure: ESOPHAGOGASTRODUODENOSCOPY (EGD) WITH PROPOFOL;  Surgeon: Toney Reil, MD;  Location: [facility] END[facility]COPY;  Service: Gastroenterology;  Laterality: N/A;  . TUBAL LIGATION      Family History  Problem Relation Age of Onset  . Coronary artery disease Mother   . Heart disease Mother   . Hypertension Mother   . Stroke Father   . Drug abuse Brother     Social History   Socioeconomic History  . Marital status: Widowed    Spouse name: Not on file  . Number of children: 1  . Years of education: Not on file  . Highest education level: Not on file  Occupational History  . 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 Smoking status: Former Smoker    Packs/day: 0.30    Years: 40.00    Pack years: 12.00    Types: Cigarettes    Quit date: 04/16/2006    Years since quitting: 13.7  . Smokeless tobacco: Never Used  Vaping Use  . Vaping Use: Never used  Substance and Sexual Activity  . Alcohol use: No  . Drug use: No  . Sexual activity: Not on file  Other Topics Concern  . Not on file  Social History Narrative   Widowed, 1 son    New relationship since 2013      No living will   Son [name] is health care POA   Would accept resuscitation attempts but no prolonged ventilation.   Would probably not accept tube feeds   Social Determinants of Health   Financial Resource Strain:   . Difficulty of Paying Living Expenses: Not on file  Food Insecurity:   . Worried About Programme researcher, broadcasting/film/video in the Last Year: Not on file  . Ran Out of Food in the Last Year: Not on file  Transportation Needs:   . Lack of Transportation (Medical): Not on file  . Lack of Transportation (Non-Medical): Not on file  Physical Activity:   . Days of Exercise per Week: Not on file  . Minutes of Exercise per Session: Not on file  Stress:   . Feeling of Stress : Not on file  Social Connections:   . Frequency of Communication with Friends and Family: Not on file  . Frequency of Social Gatherings with Friends and Family: Not on file  . Attends Religious Services: Not on file  . Active Member of Clubs or Organizations: Not on file  . Attends Banker Meetings: Not on file  . Marital Status: Not on file  Intimate Partner Violence:   . Fear of Current or Ex-Partner: Not on file  . Emotionally Abused: Not on file  . Physically Abused: Not on file  . Sexually Abused: Not on file   Review of Systems Appetite is fine Weight stable Sleep is not great----some anxiety with family stressors, friend just started medication for Alzheimer's, etc Occasional daytime somnolence--feels better after a nap    Objective:   Physical Exam Constitutional:      Appearance: Normal appearance.  Cardiovascular:     Rate and Rhythm: Normal rate and regular rhythm.     Heart sounds: No murmur heard.  No gallop.   Pulmonary:     Effort: Pulmonary effort is normal.     Breath sounds: Normal breath sounds. No wheezing or rales.  Abdominal:     Palpations: Abdomen is soft.     Tenderness: There is no abdominal tenderness.  Musculoskeletal:      Cervical back: Neck supple.     Right lower leg: No edema.     Left lower leg: No edema.  Lymphadenopathy:     Cervical: No cervical adenopathy.  Neurological:     Mental Status: She is alert.  Psychiatric:        Mood and Affect: Mood normal.        Behavior: Behavior normal.            Assessment & Plan:

## 2020-01-08 NOTE — Assessment & Plan Note (Signed)
Has large hiatal hernia Will continue daily omeprazole for this

## 2020-01-08 NOTE — Assessment & Plan Note (Signed)
BP Readings from Last 3 Encounters:  01/08/20 122/80  12/07/19 122/62  11/30/19 (!) 165/70   Doing well back on HCTZ/triamterene 25/37.5----1/2 tab daily Will check renal profile

## 2020-01-08 NOTE — Assessment & Plan Note (Signed)
Presumed GI blood loss--but no source found on EGD/colon Will recheck labs Will probably need capsule endoscopy if goes down again

## 2020-02-21 NOTE — Progress Notes (Signed)
[facility]  Telephone:(336) [phone] Fax:(336) [phone]  ID: Meghan Moody OB: [birth year]  MR#: [id number]  [facility]#:[id number]  Patient Care Team: [name], MD as PCP - General [name], MD as Referring Physician (Pediatrics) Jeralyn Ruths, MD as Consulting Physician (Oncology)  CHIEF COMPLAINT: Iron deficiency anemia.  INTERVAL HISTORY: Patient returns to clinic today for repeat laboratory work, further evaluation, and consideration of IV iron.  She currently feels well and is asymptomatic.  She does not complain of any weakness or fatigue today. She has no neurologic complaints.  She denies any recent fevers or illnesses.  She has a good appetite and denies weight loss.  She denies any chest pain, shortness of breath, cough, or hemoptysis.  She denies any nausea, vomiting, constipation, or diarrhea.  She has no melena or hematochezia.  She has no urinary complaints.  Patient offers no specific complaints today.  REVIEW OF SYSTEMS:   Review of Systems  Constitutional: Negative.  Negative for fever, malaise/fatigue and weight loss.  Respiratory: Negative.  Negative for cough, hemoptysis and shortness of breath.   Cardiovascular: Negative.  Negative for chest pain and leg swelling.  Gastrointestinal: Negative.  Negative for abdominal pain, blood in stool and melena.  Genitourinary: Negative.  Negative for hematuria.  Musculoskeletal: Negative.  Negative for back pain.  Skin: Negative.  Negative for rash.  Neurological: Negative.  Negative for dizziness, focal weakness, weakness and headaches.  Psychiatric/Behavioral: Negative.  The patient is not nervous/anxious.     As per HPI. Otherwise, a complete review of systems is negative.  PAST MEDICAL HISTORY: Past Medical History:  Diagnosis Date  . Anemia   . Chronic bronchitis (HCC)   . Chronic cough   . Deafness in left ear   . GERD (gastroesophageal reflux disease)   . H/O hemorrhoidectomy    . Hiatal hernia   . Hyperlipidemia   . Hypertension   . SOB (shortness of breath) on exertion     PAST SURGICAL HISTORY: Past Surgical History:  Procedure Laterality Date  . BREAST CYST ASPIRATION Bilateral    neg  . BREAST EXCISIONAL BIOPSY Right 1978   neg  . BUNIONECTOMY    . CATARACT EXTRACTION W/PHACO Right 03/30/2019   Procedure: CATARACT EXTRACTION PHACO AND INTRAOCULAR LENS PLACEMENT (IOC) RIGHT, 1.23, 00:20.8;  Surgeon: Nevada Crane, MD;  Location: [facility];  Service: Ophthalmology;  Laterality: Right;  . CATARACT EXTRACTION W/PHACO Left 04/27/2019   Procedure: CATARACT EXTRACTION PHACO AND INTRAOCULAR LENS PLACEMENT (IOC) LEFT 1.46  00:20.3;  Surgeon: Nevada Crane, MD;  Location: [facility];  Service: Ophthalmology;  Laterality: Left;  . COLONOSCOPY WITH PROPOFOL N/A 11/02/2019   Procedure: COLONOSCOPY WITH PROPOFOL;  Surgeon: Toney Reil, MD;  Location: [facility] ENDOSCOPY;  Service: Gastroenterology;  Laterality: N/A;  . ESOPHAGOGASTRODUODENOSCOPY (EGD) WITH PROPOFOL N/A 11/02/2019   Procedure: ESOPHAGOGASTRODUODENOSCOPY (EGD) WITH PROPOFOL;  Surgeon: Toney Reil, MD;  Location[facility] ENDOSCOPY;  Service: Gastroenterology;  Laterality: N/A;  . TUBAL LIGATION      FAMILY HISTORY: Family History  Problem Relation Age of Onset  . Coronary artery disease Mother   . Heart disease Mother   . Hypertension Mother   . Stroke Father   . Drug abuse Brother     ADVANCED DIRECTIVES (Y/N):  N  HEALTH MAINTENANCE: Social History   Tobacco Use  . Smoking status: Former Smoker    Packs/day: 0.30    Years: 40.00    Pack years:  12.00    Types: Cigarettes    Quit date: 04/16/2006    Years since quitting: 13.8  . Smokeless tobacco: Never Used  Vaping Use  . Vaping Use: Never used  Substance Use Topics  . Alcohol use: No  . Drug use: No     Colonoscopy:  PAP:  Bone density:  Lipid panel:  No Known Allergies  Current  Outpatient Medications  Medication Sig Dispense Refill  . acetaminophen (TYLENOL) 500 MG tablet Take 500 mg by mouth every 8 (eight) hours as needed.    Marland Kitchen omeprazole (PRILOSEC) 40 MG capsule Take 1 capsule (40 mg total) by mouth daily. 90 capsule 3  . triamterene-hydrochlorothiazide (MAXZIDE-25) 37.5-25 MG tablet Take 0.5 tablets by mouth daily.     No current facility-administered medications for this visit.    OBJECTIVE: Vitals:   02/23/20 1348  BP: 129/66  Pulse: 78  Resp: 20  Temp: 98 F (36.7 C)     Body mass index is 22.25 kg/m.    ECOG FS:0 - Asymptomatic  General: Well-developed, well-nourished, no acute distress. Eyes: Pink conjunctiva, anicteric sclera. HEENT: Normocephalic, moist mucous membranes. Lungs: No audible wheezing or coughing. Heart: Regular rate and rhythm. Abdomen: Soft, nontender, no obvious distention. Musculoskeletal: No edema, cyanosis, or clubbing. Neuro: Alert, answering all questions appropriately. Cranial nerves grossly intact. Skin: No rashes or petechiae noted. Psych: Normal affect.  LAB RESULTS:  Lab Results  Component Value Date   NA 140 01/08/2020   K 4.2 01/08/2020   CL 102 01/08/2020   CO2 29 01/08/2020   GLUCOSE 85 01/08/2020   BUN 31 (H) 01/08/2020   CREATININE 1.09 01/08/2020   CALCIUM 10.0 01/08/2020   PROT 6.6 08/07/2019   ALBUMIN 4.5 01/08/2020   AST 11 08/07/2019   ALT 9 08/07/2019   ALKPHOS 84 08/07/2019   BILITOT 0.4 08/07/2019   GFRNONAA 42 (L) 08/29/2015   GFRAA 49 (L) 08/29/2015    Lab Results  Component Value Date   WBC 8.7 02/22/2020   NEUTROABS 6.3 02/22/2020   HGB 14.5 02/22/2020   HCT 44.6 02/22/2020   MCV 90.5 02/22/2020   PLT 276 02/22/2020   Lab Results  Component Value Date   IRON 46 02/22/2020   TIBC 363 02/22/2020   IRONPCTSAT 13 02/22/2020   Lab Results  Component Value Date   FERRITIN 31 02/22/2020     STUDIES: No results found.  ASSESSMENT: Iron deficiency anemia.  PLAN:     1.  Iron deficiency anemia: Patient's hemoglobin and iron stores are within normal limits and she is asymptomatic. Colonoscopy and EGD on November 02, 2019 did not reveal any significant pathology.  Previously, the remainder of her laboratory work was either negative or within normal limits.  She does not require additional Venofer today.  Patient last received treatment on December 07, 2019.  Return to clinic in 4 months with repeat laboratory work, further evaluation, and consideration of IV iron if needed.    I spent a total of 20 minutes reviewing chart data, face-to-face evaluation with the patient, counseling and coordination of care as detailed above.\   Patient expressed understanding and was in agreement with this plan. She also understands that She can call clinic at any time with any questions, concerns, or complaints.     Jeralyn Ruths, MD   02/23/2020 3:08 PM

## 2020-02-22 ENCOUNTER — Inpatient Hospital Stay: Payer: Medicare HMO | Attending: Oncology

## 2020-02-22 ENCOUNTER — Other Ambulatory Visit: Payer: Self-pay

## 2020-02-22 DIAGNOSIS — E785 Hyperlipidemia, unspecified: Secondary | ICD-10-CM | POA: Diagnosis not present

## 2020-02-22 DIAGNOSIS — Z79899 Other long term (current) drug therapy: Secondary | ICD-10-CM | POA: Insufficient documentation

## 2020-02-22 DIAGNOSIS — K219 Gastro-esophageal reflux disease without esophagitis: Secondary | ICD-10-CM | POA: Insufficient documentation

## 2020-02-22 DIAGNOSIS — D509 Iron deficiency anemia, unspecified: Secondary | ICD-10-CM | POA: Insufficient documentation

## 2020-02-22 DIAGNOSIS — Z87891 Personal history of nicotine dependence: Secondary | ICD-10-CM | POA: Insufficient documentation

## 2020-02-22 DIAGNOSIS — I10 Essential (primary) hypertension: Secondary | ICD-10-CM | POA: Insufficient documentation

## 2020-02-22 LAB — CBC WITH DIFFERENTIAL/PLATELET
Abs Immature Granulocytes: 0.02 10*3/uL (ref 0.00–0.07)
Basophils Absolute: 0.1 10*3/uL (ref 0.0–0.1)
Basophils Relative: 1 %
Eosinophils Absolute: 0.1 10*3/uL (ref 0.0–0.5)
Eosinophils Relative: 2 %
HCT: 44.6 % (ref 36.0–46.0)
Hemoglobin: 14.5 g/dL (ref 12.0–15.0)
Immature Granulocytes: 0 %
Lymphocytes Relative: 18 %
Lymphs Abs: 1.6 10*3/uL (ref 0.7–4.0)
MCH: 29.4 pg (ref 26.0–34.0)
MCHC: 32.5 g/dL (ref 30.0–36.0)
MCV: 90.5 fL (ref 80.0–100.0)
Monocytes Absolute: 0.7 10*3/uL (ref 0.1–1.0)
Monocytes Relative: 8 %
Neutro Abs: 6.3 10*3/uL (ref 1.7–7.7)
Neutrophils Relative %: 71 %
Platelets: 276 10*3/uL (ref 150–400)
RBC: 4.93 MIL/uL (ref 3.87–5.11)
RDW: 18.1 % — ABNORMAL HIGH (ref 11.5–15.5)
WBC: 8.7 10*3/uL (ref 4.0–10.5)
nRBC: 0 % (ref 0.0–0.2)

## 2020-02-22 LAB — IRON AND TIBC
Iron: 46 ug/dL (ref 28–170)
Saturation Ratios: 13 % (ref 10.4–31.8)
TIBC: 363 ug/dL (ref 250–450)
UIBC: 317 ug/dL

## 2020-02-22 LAB — FERRITIN: Ferritin: 31 ng/mL (ref 11–307)

## 2020-02-23 ENCOUNTER — Encounter: Payer: Self-pay | Admitting: Oncology

## 2020-02-23 ENCOUNTER — Inpatient Hospital Stay (HOSPITAL_BASED_OUTPATIENT_CLINIC_OR_DEPARTMENT_OTHER): Payer: Medicare HMO | Admitting: Oncology

## 2020-02-23 ENCOUNTER — Inpatient Hospital Stay: Payer: Medicare HMO

## 2020-02-23 VITALS — BP 129/66 | HR 78 | Temp 98.0°F | Resp 20 | Wt 133.7 lb

## 2020-02-23 DIAGNOSIS — D500 Iron deficiency anemia secondary to blood loss (chronic): Secondary | ICD-10-CM | POA: Diagnosis not present

## 2020-02-23 DIAGNOSIS — D509 Iron deficiency anemia, unspecified: Secondary | ICD-10-CM | POA: Diagnosis not present

## 2020-06-18 NOTE — Progress Notes (Unsigned)
[facility]  Telephone:(336) [phone] Fax:(336) [phone]  ID: Meghan Moody OB: 30-Jan-1946  MR#: [id number]  [facility]#:[id number]  Patient Care Team: [name], MD as PCP - General [name], MD as Referring Physician (Pediatrics) Jeralyn Ruths, MD as Consulting Physician (Oncology)  CHIEF COMPLAINT: Iron deficiency anemia.  INTERVAL HISTORY: Patient returns to clinic today for repeat laboratory work, further evaluation, and consideration of additional IV Venofer.  She currently feels well and is asymptomatic.  She denies any weakness or fatigue.  She has no neurologic complaints.  She denies any recent fevers or illnesses.  She has a good appetite and denies weight loss.  She denies any chest pain, shortness of breath, cough, or hemoptysis.  She denies any nausea, vomiting, constipation, or diarrhea.  She has no melena or hematochezia.  She has no urinary complaints.  Patient feels at her baseline offers no specific complaints today.  REVIEW OF SYSTEMS:   Review of Systems  Constitutional: Negative.  Negative for fever, malaise/fatigue and weight loss.  Respiratory: Negative.  Negative for cough, hemoptysis and shortness of breath.   Cardiovascular: Negative.  Negative for chest pain and leg swelling.  Gastrointestinal: Negative.  Negative for abdominal pain, blood in stool and melena.  Genitourinary: Negative.  Negative for hematuria.  Musculoskeletal: Negative.  Negative for back pain.  Skin: Negative.  Negative for rash.  Neurological: Negative.  Negative for dizziness, focal weakness, weakness and headaches.  Psychiatric/Behavioral: Negative.  The patient is not nervous/anxious.     As per HPI. Otherwise, a complete review of systems is negative.  PAST MEDICAL HISTORY: Past Medical History:  Diagnosis Date  . Anemia   . Chronic bronchitis (HCC)   . Chronic cough   . Deafness in left ear   . GERD (gastroesophageal reflux disease)   . H/O  hemorrhoidectomy   . Hiatal hernia   . Hyperlipidemia   . Hypertension   . SOB (shortness of breath) on exertion     PAST SURGICAL HISTORY: Past Surgical History:  Procedure Laterality Date  . BREAST CYST ASPIRATION Bilateral    neg  . BREAST EXCISIONAL BIOPSY Right 1978   neg  . BUNIONECTOMY    . CATARACT EXTRACTION W/PHACO Right 03/30/2019   Procedure: CATARACT EXTRACTION PHACO AND INTRAOCULAR LENS PLACEMENT (IOC) RIGHT, 1.23, 00:20.8;  Surgeon: Nevada Crane, MD;  Location: [facility];  Service: Ophthalmology;  Laterality: Right;  . CATARACT EXTRACTION W/PHACO Left 04/27/2019   Procedure: CATARACT EXTRACTION PHACO AND INTRAOCULAR LENS PLACEMENT (IOC) LEFT 1.46  00:20.3;  Surgeon: Nevada Crane, MD;  Location: [facility];  Service: Ophthalmology;  Laterality: Left;  . COLONOSCOPY WITH PROPOFOL N/A 11/02/2019   Procedure: COLONOSCOPY WITH PROPOFOL;  Surgeon: Toney Reil, MD;  Location: [facility] ENDOSCOPY;  Service: Gastroenterology;  Laterality: N/A;  . ESOPHAGOGASTRODUODENOSCOPY (EGD) WITH PROPOFOL N/A 11/02/2019   Procedure: ESOPHAGOGASTRODUODENOSCOPY (EGD) WITH PROPOFOL;  Surgeon: Toney Reil, MD;  Location: [facility] ENDOSCOPY;  Service: Gastroenterology;  Laterality: N/A;  . TUBAL LIGATION      FAMILY HISTORY: Family History  Problem Relation Age of Onset  . Coronary artery disease Mother   . Heart disease Mother   . Hypertension Mother   . Stroke Father   . Drug abuse Brother     ADVANCED DIRECTIVES (Y/N):  N  HEALTH MAINTENANCE: Social History   Tobacco Use  . Smoking status: Former Smoker    Packs/day: 0.30    Years: 40.00  Pack years: 12.00    Types: Cigarettes    Quit date: 04/16/2006    Years since quitting: 14.2  . Smokeless tobacco: Never Used  Vaping Use  . Vaping Use: Never used  Substance Use Topics  . Alcohol use: No  . Drug use: No     Colonoscopy:  PAP:  Bone density:  Lipid panel:  No Known  Allergies  Current Outpatient Medications  Medication Sig Dispense Refill  . acetaminophen (TYLENOL) 500 MG tablet Take 500 mg by mouth every 8 (eight) hours as needed.    . cyclobenzaprine (FLEXERIL) 5 MG tablet Take 5 mg by mouth 3 (three) times daily as needed for muscle spasms.    Marland Kitchen omeprazole (PRILOSEC) 40 MG capsule Take 1 capsule (40 mg total) by mouth daily. 90 capsule 3  . triamterene-hydrochlorothiazide (MAXZIDE-25) 37.5-25 MG tablet Take 0.5 tablets by mouth daily.     No current facility-administered medications for this visit.    OBJECTIVE: Vitals:   06/23/20 1412  BP: (!) 143/80  Pulse: 96  Temp: 98.2 F (36.8 C)  SpO2: 98%     Body mass index is 23.16 kg/m.    ECOG FS:0 - Asymptomatic  General: Well-developed, well-nourished, no acute distress. Eyes: Pink conjunctiva, anicteric sclera. HEENT: Normocephalic, moist mucous membranes. Lungs: No audible wheezing or coughing. Heart: Regular rate and rhythm. Abdomen: Soft, nontender, no obvious distention. Musculoskeletal: No edema, cyanosis, or clubbing. Neuro: Alert, answering all questions appropriately. Cranial nerves grossly intact. Skin: No rashes or petechiae noted. Psych: Normal affect.   LAB RESULTS:  Lab Results  Component Value Date   NA 140 01/08/2020   K 4.2 01/08/2020   CL 102 01/08/2020   CO2 29 01/08/2020   GLUCOSE 85 01/08/2020   BUN 31 (H) 01/08/2020   CREATININE 1.09 01/08/2020   CALCIUM 10.0 01/08/2020   PROT 6.6 08/07/2019   ALBUMIN 4.5 01/08/2020   AST 11 08/07/2019   ALT 9 08/07/2019   ALKPHOS 84 08/07/2019   BILITOT 0.4 08/07/2019   GFRNONAA 42 (L) 08/29/2015   GFRAA 49 (L) 08/29/2015    Lab Results  Component Value Date   WBC 8.6 06/22/2020   NEUTROABS 6.1 06/22/2020   HGB 15.1 (H) 06/22/2020   HCT 46.3 (H) 06/22/2020   MCV 93.2 06/22/2020   PLT 241 06/22/2020   Lab Results  Component Value Date   IRON 49 06/22/2020   TIBC 449 06/22/2020   IRONPCTSAT 11 06/22/2020    Lab Results  Component Value Date   FERRITIN 7 (L) 06/22/2020     STUDIES: No results found.  ASSESSMENT: Iron deficiency anemia.  PLAN:    1.  Iron deficiency anemia: Patient has a mildly elevated hemoglobin and decreased ferritin, but the remainder of her iron stores are within normal limits.  She is also asymptomatic. Colonoscopy and EGD on November 02, 2019 did not reveal any significant pathology.  Previously, the remainder of her laboratory work was either negative or within normal limits.  She does not require additional Venofer today. Patient last received treatment on December 07, 2019.  Return to clinic in 4 months with repeat laboratory work, further evaluation, and continuation of treatment if needed.   I spent a total of 20 minutes reviewing chart data, face-to-face evaluation with the patient, counseling and coordination of care as detailed above.  Patient expressed understanding and was in agreement with this plan. She also understands that She can call clinic at any time with any questions, concerns, or  complaints.     Jeralyn Ruths, MD   06/24/2020 9:02 AM

## 2020-06-22 ENCOUNTER — Other Ambulatory Visit: Payer: Self-pay

## 2020-06-22 ENCOUNTER — Inpatient Hospital Stay: Payer: Medicare HMO | Attending: Oncology

## 2020-06-22 DIAGNOSIS — Z87891 Personal history of nicotine dependence: Secondary | ICD-10-CM | POA: Diagnosis not present

## 2020-06-22 DIAGNOSIS — E785 Hyperlipidemia, unspecified: Secondary | ICD-10-CM | POA: Diagnosis not present

## 2020-06-22 DIAGNOSIS — D509 Iron deficiency anemia, unspecified: Secondary | ICD-10-CM

## 2020-06-22 DIAGNOSIS — K219 Gastro-esophageal reflux disease without esophagitis: Secondary | ICD-10-CM | POA: Insufficient documentation

## 2020-06-22 DIAGNOSIS — Z79899 Other long term (current) drug therapy: Secondary | ICD-10-CM | POA: Insufficient documentation

## 2020-06-22 DIAGNOSIS — I10 Essential (primary) hypertension: Secondary | ICD-10-CM | POA: Insufficient documentation

## 2020-06-22 LAB — CBC WITH DIFFERENTIAL/PLATELET
Basophils Absolute: 0.1 10*3/uL (ref 0.0–0.1)
Basophils Relative: 1 %
Eosinophils Absolute: 0.1 10*3/uL (ref 0.0–0.5)
Eosinophils Relative: 2 %
HCT: 46.3 % — ABNORMAL HIGH (ref 36.0–46.0)
Hemoglobin: 15.1 g/dL — ABNORMAL HIGH (ref 12.0–15.0)
Lymphocytes Relative: 19 %
Lymphs Abs: 1.6 10*3/uL (ref 0.7–4.0)
MCH: 30.4 pg (ref 26.0–34.0)
MCHC: 32.6 g/dL (ref 30.0–36.0)
MCV: 93.2 fL (ref 80.0–100.0)
Monocytes Absolute: 0.7 10*3/uL (ref 0.1–1.0)
Monocytes Relative: 8 %
Neutro Abs: 6.1 10*3/uL (ref 1.7–7.7)
Neutrophils Relative %: 70 %
Platelets: 241 10*3/uL (ref 150–400)
RBC: 4.97 MIL/uL (ref 3.87–5.11)
RDW: 13.7 % (ref 11.5–15.5)
WBC: 8.6 10*3/uL (ref 4.0–10.5)

## 2020-06-22 LAB — IRON AND TIBC
Iron: 49 ug/dL (ref 28–170)
Saturation Ratios: 11 % (ref 10.4–31.8)
TIBC: 449 ug/dL (ref 250–450)
UIBC: 400 ug/dL

## 2020-06-22 LAB — FERRITIN: Ferritin: 7 ng/mL — ABNORMAL LOW (ref 11–307)

## 2020-06-23 ENCOUNTER — Inpatient Hospital Stay: Payer: Medicare HMO

## 2020-06-23 ENCOUNTER — Encounter: Payer: Self-pay | Admitting: Oncology

## 2020-06-23 ENCOUNTER — Inpatient Hospital Stay (HOSPITAL_BASED_OUTPATIENT_CLINIC_OR_DEPARTMENT_OTHER): Payer: Medicare HMO | Admitting: Oncology

## 2020-06-23 VITALS — BP 143/80 | HR 96 | Temp 98.2°F | Wt 139.2 lb

## 2020-06-23 DIAGNOSIS — K219 Gastro-esophageal reflux disease without esophagitis: Secondary | ICD-10-CM | POA: Diagnosis not present

## 2020-06-23 DIAGNOSIS — E785 Hyperlipidemia, unspecified: Secondary | ICD-10-CM | POA: Diagnosis not present

## 2020-06-23 DIAGNOSIS — Z79899 Other long term (current) drug therapy: Secondary | ICD-10-CM | POA: Diagnosis not present

## 2020-06-23 DIAGNOSIS — D509 Iron deficiency anemia, unspecified: Secondary | ICD-10-CM

## 2020-06-23 DIAGNOSIS — I10 Essential (primary) hypertension: Secondary | ICD-10-CM | POA: Diagnosis not present

## 2020-06-23 DIAGNOSIS — Z87891 Personal history of nicotine dependence: Secondary | ICD-10-CM | POA: Diagnosis not present

## 2020-06-23 NOTE — Progress Notes (Signed)
Patient here for oncology follow-up appointment, expresses concerns of occasional shortness of breath  

## 2020-07-21 ENCOUNTER — Other Ambulatory Visit: Payer: Self-pay | Admitting: Internal Medicine

## 2020-08-08 DIAGNOSIS — H16223 Keratoconjunctivitis sicca, not specified as Sjogren's, bilateral: Secondary | ICD-10-CM | POA: Diagnosis not present

## 2020-08-08 DIAGNOSIS — Z961 Presence of intraocular lens: Secondary | ICD-10-CM | POA: Diagnosis not present

## 2020-08-16 ENCOUNTER — Telehealth: Payer: Self-pay | Admitting: Internal Medicine

## 2020-08-16 ENCOUNTER — Ambulatory Visit (INDEPENDENT_AMBULATORY_CARE_PROVIDER_SITE_OTHER): Payer: Medicare HMO | Admitting: Internal Medicine

## 2020-08-16 ENCOUNTER — Other Ambulatory Visit: Payer: Self-pay

## 2020-08-16 ENCOUNTER — Encounter: Payer: Self-pay | Admitting: Internal Medicine

## 2020-08-16 VITALS — BP 136/82 | HR 84 | Temp 97.0°F | Ht 64.75 in | Wt 136.0 lb

## 2020-08-16 DIAGNOSIS — R69 Illness, unspecified: Secondary | ICD-10-CM | POA: Diagnosis not present

## 2020-08-16 DIAGNOSIS — N1831 Chronic kidney disease, stage 3a: Secondary | ICD-10-CM | POA: Diagnosis not present

## 2020-08-16 DIAGNOSIS — Z0000 Encounter for general adult medical examination without abnormal findings: Secondary | ICD-10-CM | POA: Diagnosis not present

## 2020-08-16 DIAGNOSIS — I10 Essential (primary) hypertension: Secondary | ICD-10-CM | POA: Diagnosis not present

## 2020-08-16 DIAGNOSIS — F39 Unspecified mood [affective] disorder: Secondary | ICD-10-CM | POA: Diagnosis not present

## 2020-08-16 DIAGNOSIS — K219 Gastro-esophageal reflux disease without esophagitis: Secondary | ICD-10-CM | POA: Diagnosis not present

## 2020-08-16 DIAGNOSIS — Z7189 Other specified counseling: Secondary | ICD-10-CM

## 2020-08-16 NOTE — Assessment & Plan Note (Signed)
I have personally reviewed the Medicare Annual Wellness questionnaire and have noted 1. The patient's medical and social history 2. Their use of alcohol, tobacco or illicit drugs 3. Their current medications and supplements 4. The patient's functional ability including ADL's, fall risks, home safety risks and hearing or visual             impairment. 5. Diet and physical activities 6. Evidence for depression or mood disorders  The patients weight, height, BMI and visual acuity have been recorded in the chart I have made referrals, counseling and provided education to the patient based review of the above and I have provided the pt with a written personalized care plan for preventive services.  I have provided you with a copy of your personalized plan for preventive services. Please take the time to review along with your updated medication list.  Done with colonoscopy Discussed adding resistance exercise Needs at least one more screening mammogram Consider shingrix at the pharmacy COVID booster and flu vaccine in the fall

## 2020-08-16 NOTE — Assessment & Plan Note (Signed)
Takes the daily omeprazole with success Hasn't tolerated skipping doses

## 2020-08-16 NOTE — Progress Notes (Signed)
Hearing Screening   125Hz  250Hz  500Hz  1000Hz  2000Hz  3000Hz  4000Hz  6000Hz  8000Hz   Right ear:   40 40 40  40    Left ear:   0 0 0  0

## 2020-08-16 NOTE — Patient Instructions (Signed)
Please set up your screening mammogram. 

## 2020-08-16 NOTE — Assessment & Plan Note (Signed)
Mild dysthymia at times--mostly grieving/situational Medications not indicated

## 2020-08-16 NOTE — Assessment & Plan Note (Signed)
Would consider low dose ARB if worsening Had recent blood work so will hold off today

## 2020-08-16 NOTE — Assessment & Plan Note (Signed)
BP Readings from Last 3 Encounters:  08/16/20 136/82  06/23/20 (!) 143/80  02/23/20 129/66   Doing fine on the HCTZ

## 2020-08-16 NOTE — Telephone Encounter (Signed)
Pt came in the office to drop off medical clearance form from [employer]. Placed in pt tower
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
## 2020-08-16 NOTE — Assessment & Plan Note (Signed)
Now has living will

## 2020-08-17 NOTE — Telephone Encounter (Signed)
Form placed in Dr Letvak's inbox on his desk 

## 2020-08-17 NOTE — Telephone Encounter (Signed)
Form signed No charge 

## 2020-08-19 NOTE — Telephone Encounter (Signed)
Pt aware. Sent scan for copy and mailed patient her copy

## 2020-10-04 ENCOUNTER — Telehealth: Payer: Self-pay | Admitting: Internal Medicine

## 2020-10-04 MED ORDER — TRIAMTERENE-HCTZ 37.5-25 MG PO TABS: 0.5000 | ORAL_TABLET | Freq: Every day | ORAL | 3 refills | Status: DC

## 2020-10-04 NOTE — Telephone Encounter (Signed)
Patient came in office to request a medication  Refill  LAST APPOINTMENT DATE: 08/16/2020   NEXT APPOINTMENT DATE:@Visit  date not found  MEDICATION:triamterene-hydrochlorothiazide (MAXZIDE-25) 37.5-25 MG tablet  PHARMACY:[id number] - [place], [place] - [address]  Let patient know to contact pharmacy at the end of the day to make sure medication is ready.  Please notify patient to allow 48-72 hours to process  Encourage patient to contact the pharmacy for refills or they can request refills through [facility]  CLINICAL FILLS OUT ALL BELOW:   LAST REFILL:  QTY:  REFILL DATE:    OTHER COMMENTS:    Okay for refill?  Please advise

## 2020-10-04 NOTE — Telephone Encounter (Signed)
Rx sent electronically.  

## 2020-10-19 DIAGNOSIS — H16223 Keratoconjunctivitis sicca, not specified as Sjogren's, bilateral: Secondary | ICD-10-CM | POA: Diagnosis not present

## 2020-10-28 ENCOUNTER — Other Ambulatory Visit: Payer: Medicare HMO

## 2020-10-31 ENCOUNTER — Inpatient Hospital Stay: Payer: Medicare HMO | Attending: Oncology

## 2020-10-31 ENCOUNTER — Ambulatory Visit: Payer: Medicare HMO

## 2020-10-31 ENCOUNTER — Ambulatory Visit: Payer: Medicare HMO | Admitting: Oncology

## 2020-10-31 DIAGNOSIS — Z79899 Other long term (current) drug therapy: Secondary | ICD-10-CM | POA: Diagnosis not present

## 2020-10-31 DIAGNOSIS — I10 Essential (primary) hypertension: Secondary | ICD-10-CM | POA: Diagnosis not present

## 2020-10-31 DIAGNOSIS — D509 Iron deficiency anemia, unspecified: Secondary | ICD-10-CM

## 2020-10-31 LAB — CBC WITH DIFFERENTIAL/PLATELET
Abs Immature Granulocytes: 0.02 10*3/uL (ref 0.00–0.07)
Basophils Absolute: 0.1 10*3/uL (ref 0.0–0.1)
Basophils Relative: 1 %
Eosinophils Absolute: 0.2 10*3/uL (ref 0.0–0.5)
Eosinophils Relative: 2 %
HCT: 42 % (ref 36.0–46.0)
Hemoglobin: 13.4 g/dL (ref 12.0–15.0)
Immature Granulocytes: 0 %
Lymphocytes Relative: 19 %
Lymphs Abs: 1.8 10*3/uL (ref 0.7–4.0)
MCH: 29.3 pg (ref 26.0–34.0)
MCHC: 31.9 g/dL (ref 30.0–36.0)
MCV: 91.7 fL (ref 80.0–100.0)
Monocytes Absolute: 0.7 10*3/uL (ref 0.1–1.0)
Monocytes Relative: 8 %
Neutro Abs: 6.6 10*3/uL (ref 1.7–7.7)
Neutrophils Relative %: 70 %
Platelets: 238 10*3/uL (ref 150–400)
RBC: 4.58 MIL/uL (ref 3.87–5.11)
RDW: 14.7 % (ref 11.5–15.5)
WBC: 9.3 10*3/uL (ref 4.0–10.5)
nRBC: 0 % (ref 0.0–0.2)

## 2020-10-31 LAB — IRON AND TIBC
Iron: 41 ug/dL (ref 28–170)
Saturation Ratios: 10 % — ABNORMAL LOW (ref 10.4–31.8)
TIBC: 426 ug/dL (ref 250–450)
UIBC: 385 ug/dL

## 2020-10-31 LAB — FERRITIN: Ferritin: 7 ng/mL — ABNORMAL LOW (ref 11–307)

## 2020-11-01 ENCOUNTER — Encounter: Payer: Self-pay | Admitting: Oncology

## 2020-11-01 ENCOUNTER — Inpatient Hospital Stay: Payer: Medicare HMO

## 2020-11-01 ENCOUNTER — Inpatient Hospital Stay (HOSPITAL_BASED_OUTPATIENT_CLINIC_OR_DEPARTMENT_OTHER): Payer: Medicare HMO | Admitting: Oncology

## 2020-11-01 VITALS — BP 151/75 | HR 74 | Temp 97.4°F | Wt 136.6 lb

## 2020-11-01 VITALS — BP 134/64 | HR 70 | Resp 19

## 2020-11-01 DIAGNOSIS — D509 Iron deficiency anemia, unspecified: Secondary | ICD-10-CM

## 2020-11-01 DIAGNOSIS — Z79899 Other long term (current) drug therapy: Secondary | ICD-10-CM | POA: Diagnosis not present

## 2020-11-01 DIAGNOSIS — I10 Essential (primary) hypertension: Secondary | ICD-10-CM | POA: Diagnosis not present

## 2020-11-01 MED ORDER — IRON SUCROSE 20 MG/ML IV SOLN
200.0000 mg | Freq: Once | INTRAVENOUS | Status: AC
  Administered 2020-11-01: 200 mg via INTRAVENOUS
  Filled 2020-11-01: qty 10

## 2020-11-01 MED ORDER — SODIUM CHLORIDE 0.9 % IV SOLN
Freq: Once | INTRAVENOUS | Status: AC
  Filled 2020-11-01: qty 250

## 2020-11-01 NOTE — Progress Notes (Signed)
[facility]  Telephone:(336) [phone] [phone]) [phone]  ID: ARA GRANDMAISON OB: Mar 26, 1946  MR#: [id number]  ENI#:[id number]  Patient Care Team: [name], MD as PCP - General [name], MD as Referring Physician (Pediatrics) Jeralyn Ruths, MD as Consulting Physician (Oncology)  CHIEF COMPLAINT: Iron deficiency anemia.  INTERVAL HISTORY: Patient returns to clinic today for repeat laboratory work, further evaluation, consideration of additional IV Venofer.  She has some mild fatigue, but otherwise feels well.  She has no neurologic complaints.  She denies any recent fevers or illnesses.  She has a good appetite and denies weight loss.  She denies any chest pain, shortness of breath, cough, or hemoptysis.  She denies any nausea, vomiting, constipation, or diarrhea.  She has no melena or hematochezia.  She has no urinary complaints.  Patient offers no further specific complaints today.  REVIEW OF SYSTEMS:   Review of Systems  Constitutional:  Positive for malaise/fatigue. Negative for fever and weight loss.  Respiratory: Negative.  Negative for cough, hemoptysis and shortness of breath.   Cardiovascular: Negative.  Negative for chest pain and leg swelling.  Gastrointestinal: Negative.  Negative for abdominal pain, blood in stool and melena.  Genitourinary: Negative.  Negative for hematuria.  Musculoskeletal: Negative.  Negative for back pain.  Skin: Negative.  Negative for rash.  Neurological: Negative.  Negative for dizziness, focal weakness, weakness and headaches.  Psychiatric/Behavioral: Negative.  The patient is not nervous/anxious.    As per HPI. Otherwise, a complete review of systems is negative.  PAST MEDICAL HISTORY: Past Medical History:  Diagnosis Date   Anemia    Chronic bronchitis (HCC)    Chronic cough    Deafness in left ear    GERD (gastroesophageal reflux disease)    H/O hemorrhoidectomy    Hiatal hernia    Hyperlipidemia     Hypertension    SOB (shortness of breath) on exertion     PAST SURGICAL HISTORY: Past Surgical History:  Procedure Laterality Date   BREAST CYST ASPIRATION Bilateral    neg   BREAST EXCISIONAL BIOPSY Right 1978   neg   BUNIONECTOMY     CATARACT EXTRACTION W/PHACO Right 03/30/2019   Procedure: CATARACT EXTRACTION PHACO AND INTRAOCULAR LENS PLACEMENT (IOC) RIGHT, 1.23, 00:20.8;  Surgeon: Nevada Crane, MD;  Location: [facility];  Service: Ophthalmology;  Laterality: Right;   CATARACT EXTRACTION W/PHACO Left 04/27/2019   Procedure: CATARACT EXTRACTION PHACO AND INTRAOCULAR LENS PLACEMENT (IOC) LEFT 1.46  00:20.3;  Surgeon: Nevada Crane, MD;  Location: [facility];  Service: Ophthalmology;  Laterality: Left;   COLONOSCOPY WITH PROPOFOL N/A 11/02/2019   Procedure: COLONOSCOPY WITH PROPOFOL;  Surgeon: Toney Reil, MD;  Location: [facility] ENDOSCOPY;  Service: Gastroenterology;  Laterality: N/A;   ESOPHAGOGASTRODUODENOSCOPY (EGD) WITH PROPOFOL N/A 11/02/2019   Procedure: ESOPHAGOGASTRODUODENOSCOPY (EGD) WITH PROPOFOL;  Surgeon: Toney Reil, MD;  Location: [facility] END[facility];  Service: Gastroenterology;  Laterality: N/A;   TUBAL LIGATION      FAMILY HISTORY: Family History  Problem Relation Age of Onset   Coronary artery disease Mother    Heart disease Mother    Hypertension Mother    Stroke Father    Drug abuse Brother     ADVANCED DIRECTIVES (Y/N):  N  HEALTH MAINTENANCE: Social History   Tobacco Use   Smoking status: Former    Packs/day: 0.30    Years: 40.00    Pack years: 12.00    Types: Cigarettes  Quit date: 04/16/2006    Years since quitting: 14.5   Smokeless tobacco: Never  Vaping Use   Vaping Use: Never used  Substance Use Topics   Alcohol use: No   Drug use: No     Colonoscopy:  PAP:  Bone density:  Lipid panel:  No Known Allergies  Current Outpatient Medications  Medication Sig Dispense Refill   acetaminophen  (TYLENOL) 500 MG tablet Take 500 mg by mouth every 8 (eight) hours as needed.     cyclobenzaprine (FLEXERIL) 5 MG tablet Take 5 mg by mouth 3 (three) times daily as needed for muscle spasms.     diphenhydrAMINE (BENADRYL) 25 MG tablet Take 25 mg by mouth at bedtime.     Ginkgo Biloba 40 MG TABS Take 1 tablet by mouth daily.     Lifitegrast (XIIDRA) 5 % SOLN Apply 1 drop to eye 2 (two) times daily. 1 drop each eye daily     Multiple Vitamins-Minerals (HAIR SKIN AND NAILS FORMULA PO) Take by mouth.     omeprazole (PRILOSEC) 40 MG capsule TAKE 1 CAPSULE BY MOUTH EVERY DAY 90 capsule 3   RESTASIS 0.05 % ophthalmic emulsion 1 drop 2 (two) times daily.     triamterene-hydrochlorothiazide (MAXZIDE-25) 37.5-25 MG tablet Take 0.5 tablets by mouth daily. 45 tablet 3   Turmeric 500 MG TABS Take 2 capsules by mouth daily after breakfast.     No current facility-administered medications for this visit.    OBJECTIVE: Vitals:   11/01/20 1329  BP: (!) 151/75  Pulse: 74  Temp: (!) 97.4 F (36.3 C)  SpO2: 99%     Body mass index is 22.91 kg/m.    ECOG FS:0 - Asymptomatic  General: Well-developed, well-nourished, no acute distress. Eyes: Pink conjunctiva, anicteric sclera. HEENT: Normocephalic, moist mucous membranes. Lungs: No audible wheezing or coughing. Heart: Regular rate and rhythm. Abdomen: Soft, nontender, no obvious distention. Musculoskeletal: No edema, cyanosis, or clubbing. Neuro: Alert, answering all questions appropriately. Cranial nerves grossly intact. Skin: No rashes or petechiae noted. Psych: Normal affect.    LAB RESULTS:  Lab Results  Component Value Date   NA 140 01/08/2020   K 4.2 01/08/2020   CL 102 01/08/2020   CO2 29 01/08/2020   GLUCOSE 85 01/08/2020   BUN 31 (H) 01/08/2020   CREATININE 1.09 01/08/2020   CALCIUM 10.0 01/08/2020   PROT 6.6 08/07/2019   ALBUMIN 4.5 01/08/2020   AST 11 08/07/2019   ALT 9 08/07/2019   ALKPHOS 84 08/07/2019   BILITOT 0.4  08/07/2019   GFRNONAA 42 (L) 08/29/2015   GFRAA 49 (L) 08/29/2015    Lab Results  Component Value Date   WBC 9.3 10/31/2020   NEUTROABS 6.6 10/31/2020   HGB 13.4 10/31/2020   HCT 42.0 10/31/2020   MCV 91.7 10/31/2020   PLT 238 10/31/2020   Lab Results  Component Value Date   IRON 41 10/31/2020   TIBC 426 10/31/2020   IRONPCTSAT 10 (L) 10/31/2020   Lab Results  Component Value Date   FERRITIN 7 (L) 10/31/2020     STUDIES: No results found.  ASSESSMENT: Iron deficiency anemia.  PLAN:    1.  Iron deficiency anemia: Although patient's hemoglobin continues to be within normal limits at 13 point has decreased slightly from greater than 15.  Her iron stores have also decreased and she is mildly symptomatic. Colonoscopy and EGD on November 02, 2019 did not reveal any significant pathology.  Previously, the remainder of her laboratory work  was either negative or within normal limits.  Proceed with 200 mg IV Venofer today.  Return to clinic later this week and then next week for 2 additional doses.  Patient will then return to clinic in 6 months with repeat laboratory work, further evaluation, and consideration of additional treatment if needed. 2.  Hypertension: Moderate.  Continue monitoring and treatment per primary care.  I spent a total of 30 minutes reviewing chart data, face-to-face evaluation with the patient, counseling and coordination of care as detailed above.   Patient expressed understanding and was in agreement with this plan. She also understands that She can call clinic at any time with any questions, concerns, or complaints.     Jeralyn Ruths, MD   11/04/2020 8:21 AM

## 2020-11-01 NOTE — Progress Notes (Signed)
Some fatigue. Denies dyspnea or lightheadness.  Denies any visible bleeding.

## 2020-11-01 NOTE — Patient Instructions (Signed)
[facility]  Discharge Instructions: Thank you for choosing [facility] to provide your oncology and hematology care.  If you have a lab appointment with the Cancer Center, please go directly to the Cancer Center and check in at the registration area.  Wear comfortable clothing and clothing appropriate for easy access to any Portacath or PICC line.   We strive to give you quality time with your provider. You may need to reschedule your appointment if you arrive late (15 or more minutes).  Arriving late affects you and other patients whose appointments are after yours.  Also, if you miss three or more appointments without notifying the office, you may be dismissed from the clinic at the provider's discretion.      For prescription refill requests, have your pharmacy contact our office and allow 72 hours for refills to be completed.    Today you received the following chemotherapy and/or immunotherapy agents:  Venofer   To help prevent nausea and vomiting after your treatment, we encourage you to take your nausea medication as directed.  BELOW ARE SYMPTOMS THAT SHOULD BE REPORTED IMMEDIATELY: *FEVER GREATER THAN 100.4 F (38 C) OR HIGHER *CHILLS OR SWEATING *NAUSEA AND VOMITING THAT IS NOT CONTROLLED WITH YOUR NAUSEA MEDICATION *UNUSUAL SHORTNESS OF BREATH *UNUSUAL BRUISING OR BLEEDING *URINARY PROBLEMS (pain or burning when urinating, or frequent urination) *BOWEL PROBLEMS (unusual diarrhea, constipation, pain near the anus) TENDERNESS IN MOUTH AND THROAT WITH OR WITHOUT PRESENCE OF ULCERS (sore throat, sores in mouth, or a toothache) UNUSUAL RASH, SWELLING OR PAIN  UNUSUAL VAGINAL DISCHARGE OR ITCHING   Items with * indicate a potential emergency and should be followed up as soon as possible or go to the Emergency Department if any problems should occur.  Please show the CHEMOTHERAPY ALERT CARD or IMMUNOTHERAPY ALERT CARD at check-in to  the Emergency Department and triage nurse.  Should you have questions after your visit or need to cancel or reschedule your appointment, please contact [facility]  [phone] and follow the prompts.  Office hours are 8:00 a.m. to 4:30 p.m. Monday - Friday. Please note that voicemails left after 4:00 p.m. may not be returned until the following business day.  We are closed weekends and major holidays. You have access to a nurse at all times for urgent questions. Please call the main number to the clinic [phone] and follow the prompts.  For any non-urgent questions, you may also contact your provider using MyChart. We now offer e-Visits for anyone 18 and older to request care online for non-urgent symptoms. For details visit [email or web address].   Also download the MyChart app! Go to the app store, search "MyChart", open the app, select [facility], and log in with your MyChart username and password.  Due to Covid, a mask is required upon entering the hospital/clinic. If you do not have a mask, one will be given to you upon arrival. For doctor visits, patients may have 1 support person aged 18 or older with them. For treatment visits, patients cannot have anyone with them due to current Covid guidelines and our immunocompromised population.  

## 2020-11-04 ENCOUNTER — Encounter: Payer: Self-pay | Admitting: Oncology

## 2020-11-04 ENCOUNTER — Inpatient Hospital Stay: Payer: Medicare HMO

## 2020-11-04 VITALS — BP 124/70 | HR 76 | Temp 96.9°F | Resp 16

## 2020-11-04 DIAGNOSIS — D509 Iron deficiency anemia, unspecified: Secondary | ICD-10-CM | POA: Diagnosis not present

## 2020-11-04 DIAGNOSIS — Z79899 Other long term (current) drug therapy: Secondary | ICD-10-CM | POA: Diagnosis not present

## 2020-11-04 DIAGNOSIS — I10 Essential (primary) hypertension: Secondary | ICD-10-CM | POA: Diagnosis not present

## 2020-11-04 MED ORDER — SODIUM CHLORIDE 0.9 % IV SOLN
Freq: Once | INTRAVENOUS | Status: AC
  Filled 2020-11-04: qty 250

## 2020-11-04 MED ORDER — IRON SUCROSE 20 MG/ML IV SOLN
200.0000 mg | Freq: Once | INTRAVENOUS | Status: AC
  Administered 2020-11-04: 200 mg via INTRAVENOUS
  Filled 2020-11-04: qty 10

## 2020-11-04 NOTE — Patient Instructions (Signed)

## 2020-11-08 ENCOUNTER — Other Ambulatory Visit: Payer: Self-pay

## 2020-11-08 ENCOUNTER — Inpatient Hospital Stay: Payer: Medicare HMO

## 2020-11-08 VITALS — BP 122/73 | HR 79 | Temp 96.6°F | Resp 20

## 2020-11-08 DIAGNOSIS — D509 Iron deficiency anemia, unspecified: Secondary | ICD-10-CM | POA: Diagnosis not present

## 2020-11-08 DIAGNOSIS — I10 Essential (primary) hypertension: Secondary | ICD-10-CM | POA: Diagnosis not present

## 2020-11-08 DIAGNOSIS — Z79899 Other long term (current) drug therapy: Secondary | ICD-10-CM | POA: Diagnosis not present

## 2020-11-08 MED ORDER — IRON SUCROSE 20 MG/ML IV SOLN
200.0000 mg | Freq: Once | INTRAVENOUS | Status: AC
  Administered 2020-11-08: 200 mg via INTRAVENOUS
  Filled 2020-11-08: qty 10

## 2020-11-08 MED ORDER — SODIUM CHLORIDE 0.9 % IV SOLN
INTRAVENOUS | Status: DC
  Filled 2020-11-08: qty 250

## 2020-11-08 NOTE — Patient Instructions (Signed)

## 2020-12-07 DIAGNOSIS — H16221 Keratoconjunctivitis sicca, not specified as Sjogren's, right eye: Secondary | ICD-10-CM | POA: Diagnosis not present

## 2020-12-07 DIAGNOSIS — H16223 Keratoconjunctivitis sicca, not specified as Sjogren's, bilateral: Secondary | ICD-10-CM | POA: Diagnosis not present

## 2021-01-19 ENCOUNTER — Other Ambulatory Visit: Payer: Self-pay | Admitting: Internal Medicine

## 2021-01-19 DIAGNOSIS — Z1231 Encounter for screening mammogram for malignant neoplasm of breast: Secondary | ICD-10-CM

## 2021-02-07 ENCOUNTER — Other Ambulatory Visit: Payer: Self-pay

## 2021-02-07 ENCOUNTER — Ambulatory Visit
Admission: RE | Admit: 2021-02-07 | Discharge: 2021-02-07 | Disposition: A | Discharge status: Home / Self Care | Payer: Medicare HMO | Source: Ambulatory Visit | Attending: Internal Medicine | Admitting: Internal Medicine

## 2021-02-07 DIAGNOSIS — Z1231 Encounter for screening mammogram for malignant neoplasm of breast: Secondary | ICD-10-CM | POA: Insufficient documentation

## 2021-03-02 DIAGNOSIS — H04129 Dry eye syndrome of unspecified lacrimal gland: Secondary | ICD-10-CM | POA: Diagnosis not present

## 2021-03-02 DIAGNOSIS — G4700 Insomnia, unspecified: Secondary | ICD-10-CM | POA: Diagnosis not present

## 2021-03-02 DIAGNOSIS — E785 Hyperlipidemia, unspecified: Secondary | ICD-10-CM | POA: Diagnosis not present

## 2021-03-02 DIAGNOSIS — R69 Illness, unspecified: Secondary | ICD-10-CM | POA: Diagnosis not present

## 2021-03-02 DIAGNOSIS — Z8249 Family history of ischemic heart disease and other diseases of the circulatory system: Secondary | ICD-10-CM | POA: Diagnosis not present

## 2021-03-02 DIAGNOSIS — K219 Gastro-esophageal reflux disease without esophagitis: Secondary | ICD-10-CM | POA: Diagnosis not present

## 2021-03-02 DIAGNOSIS — E119 Type 2 diabetes mellitus without complications: Secondary | ICD-10-CM | POA: Diagnosis not present

## 2021-03-02 DIAGNOSIS — R32 Unspecified urinary incontinence: Secondary | ICD-10-CM | POA: Diagnosis not present

## 2021-03-02 DIAGNOSIS — I10 Essential (primary) hypertension: Secondary | ICD-10-CM | POA: Diagnosis not present

## 2021-03-02 DIAGNOSIS — M1990 Unspecified osteoarthritis, unspecified site: Secondary | ICD-10-CM | POA: Diagnosis not present

## 2021-03-02 DIAGNOSIS — Z008 Encounter for other general examination: Secondary | ICD-10-CM | POA: Diagnosis not present

## 2021-03-02 DIAGNOSIS — J309 Allergic rhinitis, unspecified: Secondary | ICD-10-CM | POA: Diagnosis not present

## 2021-03-02 DIAGNOSIS — D649 Anemia, unspecified: Secondary | ICD-10-CM | POA: Diagnosis not present

## 2021-03-02 DIAGNOSIS — Z87891 Personal history of nicotine dependence: Secondary | ICD-10-CM | POA: Diagnosis not present

## 2021-04-21 ENCOUNTER — Encounter: Payer: Self-pay | Admitting: Oncology

## 2021-04-27 ENCOUNTER — Other Ambulatory Visit: Payer: Self-pay | Admitting: *Deleted

## 2021-04-27 DIAGNOSIS — D509 Iron deficiency anemia, unspecified: Secondary | ICD-10-CM

## 2021-05-04 ENCOUNTER — Inpatient Hospital Stay: Payer: Medicare PPO | Attending: Nurse Practitioner

## 2021-05-04 ENCOUNTER — Other Ambulatory Visit: Payer: Self-pay

## 2021-05-04 DIAGNOSIS — I10 Essential (primary) hypertension: Secondary | ICD-10-CM | POA: Diagnosis not present

## 2021-05-04 DIAGNOSIS — D509 Iron deficiency anemia, unspecified: Secondary | ICD-10-CM | POA: Insufficient documentation

## 2021-05-04 DIAGNOSIS — Z79899 Other long term (current) drug therapy: Secondary | ICD-10-CM | POA: Insufficient documentation

## 2021-05-04 DIAGNOSIS — Z87891 Personal history of nicotine dependence: Secondary | ICD-10-CM | POA: Diagnosis not present

## 2021-05-04 LAB — CBC WITH DIFFERENTIAL/PLATELET
Abs Immature Granulocytes: 0.01 10*3/uL (ref 0.00–0.07)
Basophils Absolute: 0.1 10*3/uL (ref 0.0–0.1)
Basophils Relative: 1 %
Eosinophils Absolute: 0.1 10*3/uL (ref 0.0–0.5)
Eosinophils Relative: 1 %
HCT: 39.1 % (ref 36.0–46.0)
Hemoglobin: 12.3 g/dL (ref 12.0–15.0)
Immature Granulocytes: 0 %
Lymphocytes Relative: 16 %
Lymphs Abs: 1.3 10*3/uL (ref 0.7–4.0)
MCH: 29.1 pg (ref 26.0–34.0)
MCHC: 31.5 g/dL (ref 30.0–36.0)
MCV: 92.7 fL (ref 80.0–100.0)
Monocytes Absolute: 0.6 10*3/uL (ref 0.1–1.0)
Monocytes Relative: 7 %
Neutro Abs: 6.4 10*3/uL (ref 1.7–7.7)
Neutrophils Relative %: 75 %
Platelets: 377 10*3/uL (ref 150–400)
RBC: 4.22 MIL/uL (ref 3.87–5.11)
RDW: 14 % (ref 11.5–15.5)
WBC: 8.5 10*3/uL (ref 4.0–10.5)
nRBC: 0 % (ref 0.0–0.2)

## 2021-05-04 LAB — IRON AND TIBC
Iron: 36 ug/dL (ref 28–170)
Saturation Ratios: 8 % — ABNORMAL LOW (ref 10.4–31.8)
TIBC: 463 ug/dL — ABNORMAL HIGH (ref 250–450)
UIBC: 427 ug/dL

## 2021-05-04 LAB — FERRITIN: Ferritin: 6 ng/mL — ABNORMAL LOW (ref 11–307)

## 2021-05-05 ENCOUNTER — Encounter: Payer: Self-pay | Admitting: Nurse Practitioner

## 2021-05-05 ENCOUNTER — Inpatient Hospital Stay (HOSPITAL_BASED_OUTPATIENT_CLINIC_OR_DEPARTMENT_OTHER): Payer: Medicare PPO | Admitting: Nurse Practitioner

## 2021-05-05 ENCOUNTER — Inpatient Hospital Stay: Payer: Medicare PPO

## 2021-05-05 VITALS — BP 120/70 | HR 80 | Temp 97.3°F | Resp 16 | Wt 137.4 lb

## 2021-05-05 VITALS — BP 134/70 | HR 65

## 2021-05-05 DIAGNOSIS — D509 Iron deficiency anemia, unspecified: Secondary | ICD-10-CM | POA: Diagnosis not present

## 2021-05-05 DIAGNOSIS — D500 Iron deficiency anemia secondary to blood loss (chronic): Secondary | ICD-10-CM

## 2021-05-05 MED ORDER — SODIUM CHLORIDE 0.9 % IV SOLN
INTRAVENOUS | Status: DC
  Filled 2021-05-05: qty 250

## 2021-05-05 MED ORDER — IRON SUCROSE 20 MG/ML IV SOLN
200.0000 mg | Freq: Once | INTRAVENOUS | Status: AC
  Administered 2021-05-05: 200 mg via INTRAVENOUS
  Filled 2021-05-05: qty 10

## 2021-05-05 NOTE — Progress Notes (Signed)
Pt c/o "tiredness, fatigue and weakness in my legs when I walk uphill. I may need an infusion." No other concerns or complaints at this time.

## 2021-05-05 NOTE — Addendum Note (Signed)
Addended by: Wilford Corner on: 05/05/2021 01:56 PM   Modules accepted: Orders

## 2021-05-05 NOTE — Patient Instructions (Signed)

## 2021-05-05 NOTE — Progress Notes (Signed)
[place]  Telephone:([phone]) [phone] [phone]) [phone]  ID: Meghan Moody OB: January 24, 1946  MR#: [id number]  [id number]  Patient Care Team: Venia Carbon, MD as PCP - General Venia Carbon, MD as Referring Physician (Pediatrics) Lloyd Huger, MD as Consulting Physician (Oncology)  CHIEF COMPLAINT: Iron deficiency anemia.  INTERVAL HISTORY: Patient returns to clinic today for repeat laboratory work, further evaluation, consideration of additional IV Venofer. She complains of leg cramps, worse with exertion. Noother complaints. She has no neurologic complaints.  She denies any recent fevers or illnesses.  She has a good appetite and denies weight loss.  She denies any chest pain, shortness of breath, cough, or hemoptysis.  She denies any nausea, vomiting, constipation, or diarrhea.  She has no melena or hematochezia.  She has no urinary complaints.  Patient offers no further specific complaints today.  REVIEW OF SYSTEMS:   Review of Systems  Constitutional:  Positive for malaise/fatigue. Negative for fever and weight loss.  Respiratory: Negative.  Negative for cough, hemoptysis and shortness of breath.   Cardiovascular: Negative.  Negative for chest pain and leg swelling.  Gastrointestinal: Negative.  Negative for abdominal pain, blood in stool and melena.  Genitourinary: Negative.  Negative for hematuria.  Musculoskeletal: Negative.  Negative for back pain.  Skin: Negative.  Negative for rash.  Neurological: Negative.  Negative for dizziness, focal weakness, weakness and headaches.  Psychiatric/Behavioral: Negative.  The patient is not nervous/anxious.    As per HPI. Otherwise, a complete review of systems is negative.  PAST MEDICAL HISTORY: Past Medical History:  Diagnosis Date   Anemia    Chronic bronchitis (HCC)    Chronic cough    Deafness in left ear    GERD (gastroesophageal reflux disease)    H/O hemorrhoidectomy    Hiatal hernia     Hyperlipidemia    Hypertension    SOB (shortness of breath) on exertion     PAST SURGICAL HISTORY: Past Surgical History:  Procedure Laterality Date   BREAST CYST ASPIRATION Bilateral    neg   BREAST EXCISIONAL BIOPSY Right 1978   neg   BUNIONECTOMY     CATARACT EXTRACTION W/PHACO Right 03/30/2019   Procedure: CATARACT EXTRACTION PHACO AND INTRAOCULAR LENS PLACEMENT (IOC) RIGHT, 1.23, 00:20.8;  Surgeon: Eulogio Bear, MD;  Location: [place];  Service: Ophthalmology;  Laterality: Right;   CATARACT EXTRACTION W/PHACO Left 04/27/2019   Procedure: CATARACT EXTRACTION PHACO AND INTRAOCULAR LENS PLACEMENT (IOC) LEFT 1.46  00:20.3;  Surgeon: Eulogio Bear, MD;  Location: [place];  Service: Ophthalmology;  Laterality: Left;   COLONOSCOPY WITH PROPOFOL N/A 11/02/2019   Procedure: COLONOSCOPY WITH PROPOFOL;  Surgeon: Lin Landsman, MD;  Location: [facility];  Service: Gastroenterology;  Laterality: N/A;   ESOPHAGOGASTRODUODENOSCOPY (EGD) WITH PROPOFOL N/A 11/02/2019   Procedure: ESOPHAGOGASTRODUODENOSCOPY (EGD) WITH PROPOFOL;  Surgeon: Lin Landsman, MD;  Location: [facility] ENDOSCOPY;  Service: Gastroenterology;  Laterality: N/A;   TUBAL LIGATION      FAMILY HISTORY: Family History  Problem Relation Age of Onset   Coronary artery disease Mother    Heart disease Mother    Hypertension Mother    Stroke Father    Drug abuse Brother     ADVANCED DIRECTIVES (Y/N):  N  HEALTH MAINTENANCE: Social History   Tobacco Use   Smoking status: Former    Packs/day: 0.30    Years: 40.00    Pack years: 12.00    Types: Cigarettes  Quit date: 04/16/2006    Years since quitting: 15.0   Smokeless tobacco: Never  Vaping Use   Vaping Use: Never used  Substance Use Topics   Alcohol use: No   Drug use: No     Colonoscopy:  PAP:  Bone density:  Lipid panel:  No Known Allergies  Current Outpatient Medications  Medication Sig Dispense Refill    acetaminophen (TYLENOL) 500 MG tablet Take 500 mg by mouth every 8 (eight) hours as needed.     diphenhydrAMINE (BENADRYL) 25 MG tablet Take 25 mg by mouth at bedtime.     Lifitegrast (XIIDRA) 5 % SOLN Apply 1 drop to eye 2 (two) times daily. 1 drop each eye daily     Multiple Vitamins-Minerals (HAIR SKIN AND NAILS FORMULA PO) Take by mouth.     omeprazole (PRILOSEC) 40 MG capsule TAKE 1 CAPSULE BY MOUTH EVERY DAY 90 capsule 3   RESTASIS 0.05 % ophthalmic emulsion 1 drop 2 (two) times daily.     triamterene-hydrochlorothiazide (MAXZIDE-25) 37.5-25 MG tablet Take 0.5 tablets by mouth daily. 45 tablet 3   cyclobenzaprine (FLEXERIL) 5 MG tablet Take 5 mg by mouth 3 (three) times daily as needed for muscle spasms. (Patient not taking: Reported on 05/05/2021)     Ginkgo Biloba 40 MG TABS Take 1 tablet by mouth daily. (Patient not taking: Reported on 05/05/2021)     Turmeric 500 MG TABS Take 2 capsules by mouth daily after breakfast. (Patient not taking: Reported on 05/05/2021)     No current facility-administered medications for this visit.    OBJECTIVE: Vitals:   05/05/21 1258  BP: 120/70  Pulse: 80  Resp: 16  Temp: (!) 97.3 F (36.3 C)  SpO2: 96%     Body mass index is 23.04 kg/m.    ECOG FS:0 - Asymptomatic  General: Well-developed, well-nourished, no acute distress. Eyes: Pink conjunctiva, anicteric sclera. Lungs: Clear to auscultation bilaterally.  No audible wheezing or coughing Heart: Regular rate and rhythm.  Abdomen: Soft, nontender, nondistended.  Musculoskeletal: No edema, cyanosis, or clubbing. Neuro: Alert, answering all questions appropriately. Cranial nerves grossly intact. Skin: No rashes or petechiae noted. Psych: Normal affect.   LAB RESULTS:  Lab Results  Component Value Date   NA 140 01/08/2020   K 4.2 01/08/2020   CL 102 01/08/2020   CO2 29 01/08/2020   GLUCOSE 85 01/08/2020   BUN 31 (H) 01/08/2020   CREATININE 1.09 01/08/2020   CALCIUM 10.0 01/08/2020    PROT 6.6 08/07/2019   ALBUMIN 4.5 01/08/2020   AST 11 08/07/2019   ALT 9 08/07/2019   ALKPHOS 84 08/07/2019   BILITOT 0.4 08/07/2019   GFRNONAA 42 (L) 08/29/2015   GFRAA 49 (L) 08/29/2015    Lab Results  Component Value Date   WBC 8.5 05/04/2021   NEUTROABS 6.4 05/04/2021   HGB 12.3 05/04/2021   HCT 39.1 05/04/2021   MCV 92.7 05/04/2021   PLT 377 05/04/2021   Lab Results  Component Value Date   IRON 36 05/04/2021   TIBC 463 (H) 05/04/2021   IRONPCTSAT 8 (L) 05/04/2021   Lab Results  Component Value Date   FERRITIN 6 (L) 05/04/2021     STUDIES: No results found.  ASSESSMENT: Iron deficiency anemia.  PLAN:    1.  Iron deficiency anemia: Etiology unclear. Colonoscopy and EGD on November 02, 2019 did not reveal significant pathology. Iron stores and hemoglobin have dropped despite IV iron. Recommend she see GI again for evaluation and possible  capsule study. Clinically, she is asymptomatic of blood loss. Previously, remainder of her lab work up with was either negative or within normal limits. Hemoglobin remains normal but continues to downtrend, now 12.3. Ferritin and iron saturation are low with elevated TIBC, consistent with IDA. Recommend venofer x 4. She will return to clinic in 3 months for labs (cbc, cmp, ferritin, iron studies) then day to week later follow up with Dr. Grayland Ormond for possible venofer.   2.  Hypertension: Moderate.  Continue monitoring and treatment per primary care. Normal pressure today.   Disposition:  Venofer x 4 Rtc in 3 months- labs (cbc, ferritin, iron studies) See Dr. Marius Ditch for capsule study  Day to week later see Dr Grayland Ormond, +/- venofer  Patient expressed understanding and was in agreement with this plan. She also understands that She can call clinic at any time with any questions, concerns, or complaints.   Verlon Au, NP   05/05/2021 1:33 PM

## 2021-05-08 ENCOUNTER — Telehealth: Payer: Self-pay

## 2021-05-08 NOTE — Telephone Encounter (Signed)
Scheduled for July 10 2021

## 2021-05-09 ENCOUNTER — Other Ambulatory Visit: Payer: Self-pay

## 2021-05-09 ENCOUNTER — Inpatient Hospital Stay: Payer: Medicare PPO

## 2021-05-09 VITALS — BP 111/55 | HR 76 | Temp 99.0°F | Resp 20

## 2021-05-09 DIAGNOSIS — D509 Iron deficiency anemia, unspecified: Secondary | ICD-10-CM

## 2021-05-09 MED ORDER — IRON SUCROSE 20 MG/ML IV SOLN
200.0000 mg | Freq: Once | INTRAVENOUS | Status: AC
  Administered 2021-05-09: 15:00:00 200 mg via INTRAVENOUS
  Filled 2021-05-09: qty 10

## 2021-05-09 MED ORDER — SODIUM CHLORIDE 0.9 % IV SOLN
INTRAVENOUS | Status: DC
  Filled 2021-05-09: qty 250

## 2021-05-11 ENCOUNTER — Inpatient Hospital Stay: Payer: Medicare PPO

## 2021-05-11 ENCOUNTER — Other Ambulatory Visit: Payer: Self-pay

## 2021-05-11 VITALS — BP 131/73 | HR 66 | Temp 97.7°F | Resp 18

## 2021-05-11 DIAGNOSIS — D509 Iron deficiency anemia, unspecified: Secondary | ICD-10-CM | POA: Diagnosis not present

## 2021-05-11 MED ORDER — SODIUM CHLORIDE 0.9 % IV SOLN
INTRAVENOUS | Status: DC | PRN
  Filled 2021-05-11: qty 250

## 2021-05-11 MED ORDER — IRON SUCROSE 20 MG/ML IV SOLN
200.0000 mg | Freq: Once | INTRAVENOUS | Status: AC
  Administered 2021-05-11: 200 mg via INTRAVENOUS
  Filled 2021-05-11: qty 10

## 2021-05-14 ENCOUNTER — Other Ambulatory Visit: Payer: Medicare PPO

## 2021-05-16 ENCOUNTER — Inpatient Hospital Stay: Payer: Medicare PPO

## 2021-05-16 ENCOUNTER — Other Ambulatory Visit: Payer: Self-pay

## 2021-05-16 VITALS — BP 122/74 | HR 77 | Temp 96.5°F | Resp 18

## 2021-05-16 DIAGNOSIS — D509 Iron deficiency anemia, unspecified: Secondary | ICD-10-CM | POA: Diagnosis not present

## 2021-05-16 MED ORDER — IRON SUCROSE 20 MG/ML IV SOLN
200.0000 mg | Freq: Once | INTRAVENOUS | Status: AC
  Administered 2021-05-16: 200 mg via INTRAVENOUS
  Filled 2021-05-16: qty 10

## 2021-05-16 MED ORDER — SODIUM CHLORIDE 0.9 % IV SOLN
INTRAVENOUS | Status: DC
  Filled 2021-05-16: qty 250

## 2021-05-16 NOTE — Patient Instructions (Signed)
[facility]  Discharge Instructions: ?Thank you for choosing [facility] to provide your oncology and hematology care.  ?If you have a lab appointment with the Cancer Center, please go directly to the Cancer Center and check in at the registration area. ? ?Wear comfortable clothing and clothing appropriate for easy access to any Portacath or PICC line.  ? ?We strive to give you quality time with your provider. You may need to reschedule your appointment if you arrive late (15 or more minutes).  Arriving late affects you and other patients whose appointments are after yours.  Also, if you miss three or more appointments without notifying the office, you may be dismissed from the clinic at the provider?s discretion.    ?  ?For prescription refill requests, have your pharmacy contact our office and allow 72 hours for refills to be completed.   ? ?Today you received the following chemotherapy and/or immunotherapy agents VENOFER    ?  ?To help prevent nausea and vomiting after your treatment, we encourage you to take your nausea medication as directed. ? ?BELOW ARE SYMPTOMS THAT SHOULD BE REPORTED IMMEDIATELY: ?*FEVER GREATER THAN 100.4 F (38 ?C) OR HIGHER ?*CHILLS OR SWEATING ?*NAUSEA AND VOMITING THAT IS NOT CONTROLLED WITH YOUR NAUSEA MEDICATION ?*UNUSUAL SHORTNESS OF BREATH ?*UNUSUAL BRUISING OR BLEEDING ?*URINARY PROBLEMS (pain or burning when urinating, or frequent urination) ?*BOWEL PROBLEMS (unusual diarrhea, constipation, pain near the anus) ?TENDERNESS IN MOUTH AND THROAT WITH OR WITHOUT PRESENCE OF ULCERS (sore throat, sores in mouth, or a toothache) ?UNUSUAL RASH, SWELLING OR PAIN  ?UNUSUAL VAGINAL DISCHARGE OR ITCHING  ? ?Items with * indicate a potential emergency and should be followed up as soon as possible or go to the Emergency Department if any problems should occur. ? ?Please show the CHEMOTHERAPY ALERT CARD or IMMUNOTHERAPY ALERT CARD at check-in to the  Emergency Department and triage nurse. ? ?Should you have questions after your visit or need to cancel or reschedule your appointment, please contact [facility]  [phone] and follow the prompts.  Office hours are 8:00 a.m. to 4:30 p.m. Monday - Friday. Please note that voicemails left after 4:00 p.m. may not be returned until the following business day.  We are closed weekends and major holidays. You have access to a nurse at all times for urgent questions. Please call the main number to the clinic [phone] and follow the prompts. ? ?For any non-urgent questions, you may also contact your provider using MyChart. We now offer e-Visits for anyone 18 and older to request care online for non-urgent symptoms. For details visit [email or web address]. ?  ?Also download the MyChart app! Go to the app store, search "MyChart", open the app, select [facility], and log in with your MyChart username and password. ? ?Due to Covid, a mask is required upon entering the hospital/clinic. If you do not have a mask, one will be given to you upon arrival. For doctor visits, patients may have 1 support person aged 18 or older with them. For treatment visits, patients cannot have anyone with them due to current Covid guidelines and our immunocompromised population.  ? ?Iron Sucrose Injection ?What is this medication? ?IRON SUCROSE (EYE ern SOO krose) treats low levels of iron (iron deficiency anemia) in people with kidney disease. Iron is a mineral that plays an important role in making red blood cells, which carry oxygen from your lungs to the rest of your body. ?This medicine may   be used for other purposes; ask your health care provider or pharmacist if you have questions. ?COMMON BRAND NAME(S): Venofer ?What should I tell my care team before I take this medication? ?They need to know if you have any of these conditions: ?Anemia not caused by low iron levels ?Heart disease ?High levels of  iron in the blood ?Kidney disease ?Liver disease ?An unusual or allergic reaction to iron, other medications, foods, dyes, or preservatives ?Pregnant or trying to get pregnant ?Breast-feeding ?How should I use this medication? ?This medication is for infusion into a vein. It is given in a hospital or clinic setting. ?Talk to your care team about the use of this medication in children. While this medication may be prescribed for children as young as 2 years for selected conditions, precautions do apply. ?Overdosage: If you think you have taken too much of this medicine contact a poison control center or emergency room at once. ?NOTE: This medicine is only for you. Do not share this medicine with others. ?What if I miss a dose? ?It is important not to miss your dose. Call your care team if you are unable to keep an appointment. ?What may interact with this medication? ?Do not take this medication with any of the following: ?Deferoxamine ?Dimercaprol ?Other iron products ?This medication may also interact with the following: ?Chloramphenicol ?Deferasirox ?This list may not describe all possible interactions. Give your health care provider a list of all the medicines, herbs, non-prescription drugs, or dietary supplements you use. Also tell them if you smoke, drink alcohol, or use illegal drugs. Some items may interact with your medicine. ?What should I watch for while using this medication? ?Visit your care team regularly. Tell your care team if your symptoms do not start to get better or if they get worse. You may need blood work done while you are taking this medication. ?You may need to follow a special diet. Talk to your care team. Foods that contain iron include: whole grains/cereals, dried fruits, beans, or peas, leafy green vegetables, and organ meats (liver, kidney). ?What side effects may I notice from receiving this medication? ?Side effects that you should report to your care team as soon as  possible: ?Allergic reactions--skin rash, itching, hives, swelling of the face, lips, tongue, or throat ?Low blood pressure--dizziness, feeling faint or lightheaded, blurry vision ?Shortness of breath ?Side effects that usually do not require medical attention (report to your care team if they continue or are bothersome): ?Flushing ?Headache ?Joint pain ?Muscle pain ?Nausea ?Pain, redness, or irritation at injection site ?This list may not describe all possible side effects. Call your doctor for medical advice about side effects. You may report side effects to FDA at 1-800-FDA-1088. ?Where should I keep my medication? ?This medication is given in a hospital or clinic and will not be stored at home. ?NOTE: This sheet is a summary. It may not cover all possible information. If you have questions about this medicine, talk to your doctor, pharmacist, or health care provider. ?? 2022 Elsevier/Gold Standard (2020-08-26 00:00:00) ? ?

## 2021-05-18 ENCOUNTER — Inpatient Hospital Stay: Payer: Medicare PPO

## 2021-05-23 ENCOUNTER — Inpatient Hospital Stay: Payer: Medicare PPO | Attending: Nurse Practitioner

## 2021-05-23 ENCOUNTER — Other Ambulatory Visit: Payer: Self-pay

## 2021-05-23 VITALS — BP 126/74 | HR 63 | Temp 97.2°F | Resp 18

## 2021-05-23 DIAGNOSIS — D509 Iron deficiency anemia, unspecified: Secondary | ICD-10-CM

## 2021-05-23 MED ORDER — SODIUM CHLORIDE 0.9 % IV SOLN
Freq: Once | INTRAVENOUS | Status: AC
  Filled 2021-05-23: qty 250

## 2021-05-23 MED ORDER — IRON SUCROSE 20 MG/ML IV SOLN
200.0000 mg | Freq: Once | INTRAVENOUS | Status: AC
Start: 2021-05-23 — End: 2021-05-23
  Administered 2021-05-23: 200 mg via INTRAVENOUS
  Filled 2021-05-23: qty 10

## 2021-07-07 ENCOUNTER — Other Ambulatory Visit: Payer: Self-pay

## 2021-07-10 ENCOUNTER — Other Ambulatory Visit: Payer: Self-pay

## 2021-07-10 ENCOUNTER — Ambulatory Visit: Payer: Medicare PPO | Admitting: Gastroenterology

## 2021-07-10 ENCOUNTER — Encounter: Payer: Self-pay | Admitting: Gastroenterology

## 2021-07-10 VITALS — BP 116/71 | HR 93 | Temp 98.1°F | Ht 64.75 in | Wt 135.5 lb

## 2021-07-10 DIAGNOSIS — D500 Iron deficiency anemia secondary to blood loss (chronic): Secondary | ICD-10-CM | POA: Diagnosis not present

## 2021-07-10 DIAGNOSIS — D509 Iron deficiency anemia, unspecified: Secondary | ICD-10-CM

## 2021-07-10 NOTE — Progress Notes (Signed)
?  ?Arlyss Repressohini R Shephanie Romas, MD ?[address]  ?[address]  ?[place], [place] [address]  ?Main: [phone]  ?Fax: [phone] ? ? ? ?Gastroenterology Consultation ? ?Referring Provider:     Alinda DoomsAllen, Lauren G, NP ?Primary Care Physician:  Karie SchwalbeLetvak, Richard I, MD ?Primary Gastroenterologist:  Dr. Arlyss Repressohini R Dyane Broberg ?Reason for Consultation:     Recurrent iron deficiency anemia ?      ? HPI:   ?Meghan Moody is a 76 y.o. female referred by Dr. Karie SchwalbeLetvak, Richard I, MD  for consultation & management of recurrent iron deficiency anemia and history of chronic GERD ? ?Chronic iron deficiency anemia: Patient has history of iron deficiency anemia since 08/2015, she was on oral iron in the past.  Most recently on 08/07/2019, patient to follow-up with her PCP, Dr. Orlean BradfordLitvak, she was complaining of exertional dyspnea, chest pain, underwent work-up, found to have hemoglobin of 6.5, MCV 59.9, ferritin 1.8.  Patient reports that she has been symptomatic for few months, she was busy taking care of her mom who recently died.  She is then referred to Dr. Orlie DakinFinnegan on 4/27, who started her on IV iron.  She also received blood transfusion, her hemoglobin responded to 8.5 ?Since then, she reports feeling significantly better ? ?Patient denies any black stools, rectal bleeding, abdominal pain, weight loss.  She reports that she had noticed black stools several months ago ? ?Chronic GERD: She has been on omeprazole 20 mg daily, recently increased to 40 mg daily due to heartburn.  She denies difficulty swallowing, epigastric pain, nausea or vomiting ? ?Follow-up visit 07/10/2021 ?Patient is here for follow-up of recurrence of iron deficiency anemia at the request of her hematologist.  Patient was evaluated by me in 2021 for iron deficiency anemia, underwent upper endoscopy which revealed 7 cm hiatal hernia.  No evidence of celiac disease, H. pylori.  I started her on long-term PPI.  She also underwent colonoscopy which was unremarkable.  She did not undergo  video capsule endoscopy.  Her iron deficiency anemia has resolved since then with iron replacement therapy until recently when her ferritin level started declining again, along with her hemoglobin.  Most recently her ferritin was 7 and hemoglobin 12.3 in 1/23.  Patient received 5 iron infusions so far.  She has an appointment to see hematology next month.  Patient is taking multivitamin with iron in it and her stools are reported to be dark but not black.  Patient denies any symptoms of anemia such as shortness of breath, fatigue.  She continues to take omeprazole 40 mg daily. ? ?She does not smoke or drink alcohol ? ?NSAIDs: None ? ?Antiplts/Anticoagulants/Anti thrombotics: None ? ?GI Procedures: Colonoscopy in 2002 ?EGD and colonoscopy 11/02/2019 ?- Normal duodenal bulb and second portion of the duodenum. Biopsied. ?- 7 cm hiatal hernia. ?- Normal stomach. Biopsied. ?- Normal gastroesophageal junction and esophagus. ? ?- The examined portion of the ileum was normal. ?- Diverticulosis in the sigmoid colon, in the descending colon and in the transverse colon. ?- Non-bleeding external hemorrhoids. ?- The examination was otherwise normal. ?- No specimens collected. ? ?DIAGNOSIS:  ?A. DUODENUM; COLD BIOPSY:  ?- ENTERIC MUCOSA WITH PRESERVED VILLOUS ARCHITECTURE AND NO SIGNIFICANT  ?HISTOPATHOLOGIC CHANGE.  ?- NEGATIVE FOR FEATURES OF CELIAC, DYSPLASIA, AND MALIGNANCY.  ? ?B. STOMACH, RANDOM; COLD BIOPSY:  ?- GASTRIC ANTRAL AND OXYNTIC MUCOSA WITH MILD CHRONIC INACTIVE  ?GASTRITIS.  ?- NEGATIVE FOR H. PYLORI, DYSPLASIA, AND MALIGNANCY. ? ?Past Medical History:  ?Diagnosis Date  ?  Anemia   ? Chronic bronchitis (HCC)   ? Chronic cough   ? Deafness in left ear   ? GERD (gastroesophageal reflux disease)   ? H/O hemorrhoidectomy   ? Hiatal hernia   ? Hyperlipidemia   ? Hypertension   ? SOB (shortness of breath) on exertion   ? ? ?Past Surgical History:  ?Procedure Laterality Date  ? BREAST CYST ASPIRATION Bilateral   ? neg  ?  BREAST EXCISIONAL BIOPSY Right 1978  ? neg  ? BUNIONECTOMY    ? CATARACT EXTRACTION W/PHACO Right 03/30/2019  ? Procedure: CATARACT EXTRACTION PHACO AND INTRAOCULAR LENS PLACEMENT (IOC) RIGHT, 1.23, 00:20.8;  Surgeon: Nevada Crane, MD;  Location: [facility];  Service: Ophthalmology;  Laterality: Right;  ? CATARACT EXTRACTION W/PHACO Left 04/27/2019  ? Procedure: CATARACT EXTRACTION PHACO AND INTRAOCULAR LENS PLACEMENT (IOC) LEFT 1.46  00:20.3;  Surgeon: Nevada Crane, MD;  Location: [facility];  Service: Ophthalmology;  Laterality: Left;  ? COLONOSCOPY WITH PROPOFOL N/A 11/02/2019  ? Procedure: COLONOSCOPY WITH PROPOFOL;  Surgeon: Toney Reil, MD;  Location: [facility];  Service: Gastroenterology;  Laterality: N/A;  ? ESOPHAGOGASTRODUODENOSCOPY (EGD) WITH PROPOFOL N/A 11/02/2019  ? Procedure: ESOPHAGOGASTRODUODENOSCOPY (EGD) WITH PROPOFOL;  Surgeon: Toney Reil, MD;  Location: [facility];  Service: Gastroenterology;  Laterality: N/A;  ? TUBAL LIGATION    ? ? ?Current Outpatient Medications:  ?  acetaminophen (TYLENOL) 500 MG tablet, Take 500 mg by mouth every 8 (eight) hours as needed., Disp: , Rfl:  ?  CEQUA 0.09 % SOLN, SMARTSIG:2 In Eye(s) Twice Daily, Disp: , Rfl:  ?  cyclobenzaprine (FLEXERIL) 5 MG tablet, Take 5 mg by mouth 3 (three) times daily as needed for muscle spasms., Disp: , Rfl:  ?  diphenhydrAMINE (BENADRYL) 25 MG tablet, Take 25 mg by mouth at bedtime., Disp: , Rfl:  ?  Lifitegrast (XIIDRA) 5 % SOLN, Apply 1 drop to eye 2 (two) times daily. 1 drop each eye daily, Disp: , Rfl:  ?  Multiple Vitamins-Minerals (HAIR SKIN AND NAILS FORMULA PO), Take by mouth., Disp: , Rfl:  ?  omeprazole (PRILOSEC) 40 MG capsule, TAKE 1 CAPSULE BY MOUTH EVERY DAY, Disp: 90 capsule, Rfl: 3 ?  RESTASIS 0.05 % ophthalmic emulsion, 1 drop 2 (two) times daily., Disp: , Rfl:  ?  triamterene-hydrochlorothiazide (MAXZIDE-25) 37.5-25 MG tablet, Take 0.5 tablets by mouth daily.,  Disp: 45 tablet, Rfl: 3 ? ? ?Family History  ?Problem Relation Age of Onset  ? Coronary artery disease Mother   ? Heart disease Mother   ? Hypertension Mother   ? Stroke Father   ? Drug abuse Brother   ?  ? ?Social History  ? ?Tobacco Use  ? Smoking status: Former  ?  Packs/day: 0.30  ?  Years: 40.00  ?  Pack years: 12.00  ?  Types: Cigarettes  ?  Quit date: 04/16/2006  ?  Years since quitting: 15.2  ? Smokeless tobacco: Never  ?Vaping Use  ? Vaping Use: Never used  ?Substance Use Topics  ? Alcohol use: No  ? Drug use: No  ? ? ?Allergies as of 07/10/2021  ? (No Known Allergies)  ? ? ?Review of Systems:    ?All systems reviewed and negative except where noted in HPI. ? ? Physical Exam:  ?BP 116/71 (BP Location: Left Arm, Patient Position: Sitting, Cuff Size: Normal)   Pulse 93   Temp 98.1 ?F (36.7 ?C) (Oral)   Ht 5' 4.75" (1.645 m)  Wt 135 lb 8 oz (61.5 kg)   BMI 22.72 kg/m?  ?No LMP recorded. Patient is postmenopausal. ? ?General:   Alert,  Well-developed, well-nourished, pleasant and cooperative in NAD ?Head:  Normocephalic and atraumatic. ?Eyes:  Sclera clear, no icterus.   Conjunctiva pink. ?Ears:  Normal auditory acuity. ?Nose:  No deformity, discharge, or lesions. ?Mouth:  No deformity or lesions,oropharynx pink & moist. ?Neck:  Supple; no masses or thyromegaly. ?Lungs:  Respirations even and unlabored.  Clear throughout to auscultation.   No wheezes, crackles, or rhonchi. No acute distress. ?Heart:  Regular rate and rhythm; no murmurs, clicks, rubs, or gallops. ?Abdomen:  Normal bowel sounds. Soft, non-tender and non-distended without masses, hepatosplenomegaly or hernias noted.  No guarding or rebound tenderness.   ?Rectal: Not performed ?Msk:  Symmetrical without gross deformities. Good, equal movement & strength bilaterally. ?Pulses:  Normal pulses noted. ?Extremities:  No clubbing or edema.  No cyanosis. ?Neurologic:  Alert and oriented x3;  grossly normal neurologically. ?Skin:  Intact without  significant lesions or rashes. No jaundice. ?Psych:  Alert and cooperative. Normal mood and affect. ? ?Imaging Studies: ?No abdominal imaging ? ?Assessment and Plan:  ? ?Meghan Moody is a 76 y.o. female with chron

## 2021-07-12 ENCOUNTER — Telehealth: Payer: Self-pay

## 2021-07-12 NOTE — Telephone Encounter (Signed)
Tried to call patient but the number would not go through. Sent mychart message  ?

## 2021-07-12 NOTE — Telephone Encounter (Signed)
[employer] called and states they are going to denied the EGD with Capsule because she has not had a negative colonoscopy and EGD in the last 12 months.  ?

## 2021-07-12 NOTE — Telephone Encounter (Signed)
Please inform the patient about the same and if patient is agreeable, schedule EGD and colonoscopy for acute on chronic iron deficiency anemia.  If this is negative, will place video capsule study ? ?RV ?

## 2021-07-13 MED ORDER — NA SULFATE-K SULFATE-MG SULF 17.5-3.13-1.6 GM/177ML PO SOLN: 354.0000 mL | Freq: Once | ORAL | 0 refills | Status: AC

## 2021-07-13 NOTE — Addendum Note (Signed)
Addended by: Radene Knee L on: 07/13/2021 09:41 AM ? ? Modules accepted: Orders ? ?

## 2021-07-13 NOTE — Telephone Encounter (Signed)
Schedule patient for colonoscopy and EGD with Dr. Marius Ditch in 08/01/21. Sent instructions to [employer] and mailed  ?

## 2021-07-17 ENCOUNTER — Other Ambulatory Visit: Payer: Self-pay | Admitting: Internal Medicine

## 2021-07-19 NOTE — Telephone Encounter (Signed)
I have talk to her 2 times and told her to cancel the request. We are doing a EGD and colonoscopy like she requested [place] to do first  ?

## 2021-07-19 NOTE — Telephone Encounter (Signed)
Carrie with [employer] lmovm requesting call back in reference to the denial of procedure, wanted to know if we want a peer to peer  ? ?Call back #[phone] ?

## 2021-07-24 MED ORDER — CLENPIQ 10-3.5-12 MG-GM -GM/160ML PO SOLN: 320.0000 mL | Freq: Once | ORAL | 0 refills | Status: AC

## 2021-07-31 ENCOUNTER — Encounter: Payer: Self-pay | Admitting: Gastroenterology

## 2021-08-01 ENCOUNTER — Encounter
Admission: RE | Disposition: A | Discharge status: Home / Self Care | Payer: Self-pay | Source: Home / Self Care | Attending: Gastroenterology

## 2021-08-01 ENCOUNTER — Ambulatory Visit: Payer: Medicare PPO | Admitting: Certified Registered"

## 2021-08-01 ENCOUNTER — Ambulatory Visit
Admission: RE | Admit: 2021-08-01 | Discharge: 2021-08-01 | Disposition: A | Discharge status: Home / Self Care | Payer: Medicare PPO | Attending: Gastroenterology | Admitting: Gastroenterology

## 2021-08-01 ENCOUNTER — Encounter: Payer: Self-pay | Admitting: Gastroenterology

## 2021-08-01 DIAGNOSIS — K3189 Other diseases of stomach and duodenum: Secondary | ICD-10-CM | POA: Insufficient documentation

## 2021-08-01 DIAGNOSIS — D509 Iron deficiency anemia, unspecified: Secondary | ICD-10-CM | POA: Diagnosis not present

## 2021-08-01 DIAGNOSIS — K5730 Diverticulosis of large intestine without perforation or abscess without bleeding: Secondary | ICD-10-CM | POA: Insufficient documentation

## 2021-08-01 DIAGNOSIS — K449 Diaphragmatic hernia without obstruction or gangrene: Secondary | ICD-10-CM | POA: Insufficient documentation

## 2021-08-01 DIAGNOSIS — D500 Iron deficiency anemia secondary to blood loss (chronic): Secondary | ICD-10-CM | POA: Diagnosis present

## 2021-08-01 DIAGNOSIS — K644 Residual hemorrhoidal skin tags: Secondary | ICD-10-CM | POA: Insufficient documentation

## 2021-08-01 DIAGNOSIS — K633 Ulcer of intestine: Secondary | ICD-10-CM

## 2021-08-01 HISTORY — PX: GIVENS CAPSULE STUDY: SHX5432

## 2021-08-01 HISTORY — PX: COLONOSCOPY WITH PROPOFOL: SHX5780

## 2021-08-01 HISTORY — PX: ESOPHAGOGASTRODUODENOSCOPY (EGD) WITH PROPOFOL: SHX5813

## 2021-08-01 SURGERY — ESOPHAGOGASTRODUODENOSCOPY (EGD) WITH PROPOFOL
Anesthesia: General

## 2021-08-01 MED ORDER — EPHEDRINE SULFATE (PRESSORS) 50 MG/ML IJ SOLN
INTRAMUSCULAR | Status: DC | PRN
Start: 2021-08-01 — End: 2021-08-01
  Administered 2021-08-01: 5 mg via INTRAVENOUS

## 2021-08-01 MED ORDER — PROPOFOL 500 MG/50ML IV EMUL
INTRAVENOUS | Status: DC | PRN
  Administered 2021-08-01: 100 ug/kg/min via INTRAVENOUS

## 2021-08-01 MED ORDER — SODIUM CHLORIDE 0.9 % IV SOLN: INTRAVENOUS | Status: DC

## 2021-08-01 MED ORDER — PROPOFOL 500 MG/50ML IV EMUL
INTRAVENOUS | Status: AC
  Filled 2021-08-01: qty 50

## 2021-08-01 MED ORDER — PROPOFOL 10 MG/ML IV BOLUS
INTRAVENOUS | Status: DC | PRN
  Administered 2021-08-01 (×2): 40 mg via INTRAVENOUS

## 2021-08-01 MED ORDER — LIDOCAINE HCL (CARDIAC) PF 100 MG/5ML IV SOSY
PREFILLED_SYRINGE | INTRAVENOUS | Status: DC | PRN
  Administered 2021-08-01: 40 mg via INTRAVENOUS

## 2021-08-01 NOTE — H&P (Signed)
?[name], MD ?[address]  ?[address]  ?[place], [place] [address]  ?Main: [phone]  ?Fax: [phone] ?Pager: [phone] ? ?Primary Care Physician:  [name], MD ?Primary Gastroenterologist:  Dr. [name] ? ?Pre-Procedure History & Physical: ?HPI:  Meghan Moody is a 76 y.o. female is here for an endoscopy, colonoscopy, and capsule endoscopy. ?  ?Past Medical History:  ?Diagnosis Date  ? Anemia   ? Chronic bronchitis (HCC)   ? Chronic cough   ? Deafness in left ear   ? GERD (gastroesophageal reflux disease)   ? H/O hemorrhoidectomy   ? Hiatal hernia   ? Hyperlipidemia   ? Hypertension   ? SOB (shortness of breath) on exertion   ? ? ?Past Surgical History:  ?Procedure Laterality Date  ? BREAST CYST ASPIRATION Bilateral   ? neg  ? BREAST EXCISIONAL BIOPSY Right 1978  ? neg  ? BUNIONECTOMY    ? CATARACT EXTRACTION W/PHACO Right 03/30/2019  ? Procedure: CATARACT EXTRACTION PHACO AND INTRAOCULAR LENS PLACEMENT (IOC) RIGHT, 1.23, 00:20.8;  Surgeon: Nevada Crane, MD;  Location: [facility];  Service: Ophthalmology;  Laterality: Right;  ? CATARACT EXTRACTION W/PHACO Left 04/27/2019  ? Procedure: CATARACT EXTRACTION PHACO AND INTRAOCULAR LENS PLACEMENT (IOC) LEFT 1.46  00:20.3;  Surgeon: Nevada Crane, MD;  Location: [facility];  Service: Ophthalmology;  Laterality: Left;  ? COLONOSCOPY WITH PROPOFOL N/A 11/02/2019  ? Procedure: COLONOSCOPY WITH PROPOFOL;  Surgeon: Toney Reil, MD;  Location[facility];  Service: Gastroenterology;  Laterality: N/A;  ? ESOPHAGOGASTRODUODENOSCOPY (EGD) WITH PROPOFOL N/A 11/02/2019  ? Procedure: ESOPHAGOGASTRODUODENOSCOPY (EGD) WITH PROPOFOL;  Surgeon: Toney Reil, MD;  Location: [facility];  Service: Gastroenterology;  Laterality: N/A;  ? TUBAL LIGATION    ? ? ?Prior to Admission medications   ?Medication Sig Start Date End Date Taking? Authorizing Provider  ?diphenhydrAMINE (BENADRYL) 25 MG tablet Take 25 mg  by mouth at bedtime.   Yes [provider]  ?Lifitegrast Benay Spice) 5 % SOLN Apply 1 drop to eye 2 (two) times daily. 1 drop each eye daily   Yes [provider]  ?omeprazole (PRILOSEC) 40 MG capsule TAKE 1 CAPSULE BY MOUTH EVERY DAY 07/17/21  Yes Tillman Abide I, MD  ?RESTASIS 0.05 % ophthalmic emulsion 1 drop 2 (two) times daily. 08/08/20  Yes [provider]  ?triamterene-hydrochlorothiazide (MAXZIDE-25) 37.5-25 MG tablet Take 0.5 tablets by mouth daily. 10/04/20  Yes [name], MD  ?acetaminophen (TYLENOL) 500 MG tablet Take 500 mg by mouth every 8 (eight) hours as needed.    [provider]  ?CEQUA 0.09 % SOLN SMARTSIG:2 In Eye(s) Twice Daily 01/10/21   [provider]  ?cyclobenzaprine (FLEXERIL) 5 MG tablet Take 5 mg by mouth 3 (three) times daily as needed for muscle spasms.    [provider]  ?Multiple Vitamins-Minerals (HAIR SKIN AND NAILS FORMULA PO) Take by mouth.    [provider]  ? ? ?Allergies as of 07/11/2021  ? (No Known Allergies)  ? ? ?Family History  ?Problem Relation Age of Onset  ? Coronary artery disease Mother   ? Heart disease Mother   ? Hypertension Mother   ? Stroke Father   ? Drug abuse Brother   ? ? ?Social History  ? ?Socioeconomic History  ? Marital status: Widowed  ?  Spouse name: Not on file  ? Number of children: 1  ? Years of education: Not on file  ? Highest  education level: Not on file  ?Occupational History  ? 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 Smoking status: Former  ?  Packs/day: 0.30  ?  Years: 40.00  ?  Pack years: 12.00  ?  Types: Cigarettes  ?  Quit date: 04/16/2006  ?  Years since quitting: 15.3  ? Smokeless tobacco: Never  ?Vaping Use  ? Vaping Use: Never used  ?Substance and Sexual Activity  ? Alcohol use: No  ? Drug use: No  ? Sexual activity: Not on file  ?Other Topics Concern  ? Not on file  ?Social History Narrative  ? Widowed, 1 son  ? New relationship since 2013  ?    ? Has living will  ? Son [name] is health care POA  ? Would accept resuscitation attempts but no prolonged ventilation.  ? Would probably not accept tube feeds  ? ?Social Determinants of Health  ? ?Financial Resource Strain: Not on file  ?Food Insecurity: Not on file  ?Transportation Needs: Not on file  ?Physical Activity: Not on file  ?Stress: Not on file  ?Social Connections: Not on file  ?Intimate Partner Violence: Not on file  ? ? ?Review of Systems: ?See HPI, otherwise negative ROS ? ?Physical Exam: ?BP (!) 151/72   Pulse 62   Temp (!) 97.1 ?F (36.2 ?C) (Temporal)   Resp 16   Ht 5\' 5"  (1.651 m)   Wt 59 kg   SpO2 99%   BMI 21.63 kg/m?  ?General:   Alert,  pleasant and cooperative in NAD ?Head:  Normocephalic and atraumatic. ?Neck:  Supple; no masses or thyromegaly. ?Lungs:  Clear throughout to auscultation.    ?Heart:  Regular rate and rhythm. ?Abdomen:  Soft, nontender and nondistended. Normal bowel sounds, without guarding, and without rebound.   ?Neurologic:  Alert and  oriented x4;  grossly normal neurologically. ? ?Impression/Plan: ?[name] is here for an endoscopy, colonoscopy, and video capsule endoscopy to be performed for chronic IDA ? ?Risks, benefits, limitations, and alternatives regarding  endoscopy, colonoscopy, and video capsule endoscopy have been reviewed with the patient.  Questions have been answered.  All parties agreeable. ? ? ?Theodore Demark, MD  08/01/2021, 7:37 AM ?

## 2021-08-01 NOTE — Anesthesia Preprocedure Evaluation (Signed)
Anesthesia Evaluation  ?Patient identified by MRN, date of birth, ID band ?Patient awake ? ? ? ?Reviewed: ?Allergy & Precautions, H&P , NPO status , Patient's Chart, lab work & pertinent test results, reviewed documented beta blocker date and time  ? ?Airway ?Mallampati: II ? ? ?Neck ROM: full ? ? ? Dental ? ?(+) Poor Dentition ?  ?Pulmonary ?neg pulmonary ROS, former smoker,  ?  ?Pulmonary exam normal ? ? ? ? ? ? ? Cardiovascular ?Exercise Tolerance: Good ?hypertension, On Medications ?negative cardio ROS ?Normal cardiovascular exam ?Rhythm:regular Rate:Normal ? ? ?  ?Neuro/Psych ?PSYCHIATRIC DISORDERS  Neuromuscular disease   ? GI/Hepatic ?Neg liver ROS, hiatal hernia, GERD  Medicated,  ?Endo/Other  ?negative endocrine ROS ? Renal/GU ?Renal disease  ?negative genitourinary ?  ?Musculoskeletal ? ? Abdominal ?  ?Peds ? Hematology ? ?(+) Blood dyscrasia, anemia ,   ?Anesthesia Other Findings ?Past Medical History: ?No date: Anemia ?No date: Chronic bronchitis ([place]) ?No date: Chronic cough ?No date: Deafness in left ear ?No date: GERD (gastroesophageal reflux disease) ?No date: H/O hemorrhoidectomy ?No date: Hiatal hernia ?No date: Hyperlipidemia ?No date: Hypertension ?No date: SOB (shortness of breath) on exertion ?Past Surgical History: ?No date: BREAST CYST ASPIRATION; Bilateral ?    Comment:  neg ?1978: BREAST EXCISIONAL BIOPSY; Right ?    Comment:  neg ?No date: BUNIONECTOMY ?03/30/2019: CATARACT EXTRACTION W/PHACO; Right ?    Comment:  Procedure: CATARACT EXTRACTION PHACO AND INTRAOCULAR  ?             LENS PLACEMENT (IOC) RIGHT, 1.23, 00:20.8;  Surgeon:  ?             Eulogio Bear, MD;  Location: [place];   ?             Service: Ophthalmology;  Laterality: Right; ?04/27/2019: CATARACT EXTRACTION W/PHACO; Left ?    Comment:  Procedure: CATARACT EXTRACTION PHACO AND INTRAOCULAR  ?             LENS PLACEMENT (IOC) LEFT 1.46  00:20.3;  Surgeon: Edison Pace,  ?              Josie Saunders, MD;  Location: [place];   ?             Service: Ophthalmology;  Laterality: Left; ?11/02/2019: COLONOSCOPY WITH PROPOFOL; N/A ?    Comment:  Procedure: COLONOSCOPY WITH PROPOFOL;  Surgeon: Marius Ditch,  ?             Tally Due, MD;  Location: [facility];  Service:  ?             Gastroenterology;  Laterality: N/A; ?11/02/2019: ESOPHAGOGASTRODUODENOSCOPY (EGD) WITH PROPOFOL; N/A ?    Comment:  Procedure: ESOPHAGOGASTRODUODENOSCOPY (EGD) WITH  ?             PROPOFOL;  Surgeon: Lin Landsman, MD;  Location:  ?             [facility];  Service: Gastroenterology;  Laterality:  ?             N/A; ?No date: TUBAL LIGATION ? ? Reproductive/Obstetrics ?negative OB ROS ? ?  ? ? ? ? ? ? ? ? ? ? ? ? ? ?  ?  ? ? ? ? ? ? ? ? ?Anesthesia Physical ?Anesthesia Plan ? ?ASA: 3 ? ?Anesthesia Plan: General  ? ?Post-op Pain Management:   ? ?Induction:  ? ?PONV Risk Score and Plan:  ? ?Airway  Management Planned:  ? ?Additional Equipment:  ? ?Intra-op Plan:  ? ?Post-operative Plan:  ? ?Informed Consent: I have reviewed the patients History and Physical, chart, labs and discussed the procedure including the risks, benefits and alternatives for the proposed anesthesia with the patient or authorized representative who has indicated his/her understanding and acceptance.  ? ? ? ?Dental Advisory Given ? ?Plan Discussed with: CRNA ? ?Anesthesia Plan Comments:   ? ? ? ? ? ? ?Anesthesia Quick Evaluation ? ?

## 2021-08-01 NOTE — Op Note (Signed)
[facility] ?Gastroenterology ?Patient Name: Meghan Moody ?Procedure Date: 08/01/2021 7:15 AM ?MRN: [id number] ?Account #: [id number] ?Date of Birth: [birth year] ?Admit Type: Outpatient ?Age: 76 ?Room: [facility] ENDO ROOM 3 ?Gender: Female ?Note Status: Finalized ?Instrument Name: Upper-Endoscope [id number] ?Procedure:             Upper GI endoscopy ?Indications:           Iron deficiency anemia secondary to chronic blood  ?                       loss, Suspected upper gastrointestinal bleeding in  ?                       patient with unexplained iron deficiency anemia ?Providers:             Lin Landsman MD, MD ?Referring MD:          Venia Carbon (Referring MD) ?Medicines:             General Anesthesia ?Complications:         No immediate complications. Estimated blood loss: None. ?Procedure:             Pre-Anesthesia Assessment: ?                       - Prior to the procedure, a History and Physical was  ?                       performed, and patient medications and allergies were  ?                       reviewed. The patient is competent. The risks and  ?                       benefits of the procedure and the sedation options and  ?                       risks were discussed with the patient. All questions  ?                       were answered and informed consent was obtained.  ?                       Patient identification and proposed procedure were  ?                       verified by the physician, the nurse, the  ?                       anesthesiologist, the anesthetist and the technician  ?                       in the pre-procedure area in the procedure room in the  ?                       endoscopy suite. Mental Status Examination: alert and  ?                       oriented. Airway Examination: normal oropharyngeal  ?  airway and neck mobility. Respiratory Examination:  ?                       clear to auscultation. CV Examination: normal.  ?                        Prophylactic Antibiotics: The patient does not require  ?                       prophylactic antibiotics. Prior Anticoagulants: The  ?                       patient has taken no previous anticoagulant or  ?                       antiplatelet agents. ASA Grade Assessment: III - A  ?                       patient with severe systemic disease. After reviewing  ?                       the risks and benefits, the patient was deemed in  ?                       satisfactory condition to undergo the procedure. The  ?                       anesthesia plan was to use general anesthesia.  ?                       Immediately prior to administration of medications,  ?                       the patient was re-assessed for adequacy to receive  ?                       sedatives. The heart rate, respiratory rate, oxygen  ?                       saturations, blood pressure, adequacy of pulmonary  ?                       ventilation, and response to care were monitored  ?                       throughout the procedure. The physical status of the  ?                       patient was re-assessed after the procedure. ?                       After obtaining informed consent, the endoscope was  ?                       passed under direct vision. Throughout the procedure,  ?                       the patient's blood pressure, pulse, and oxygen  ?  saturations were monitored continuously. The Endoscope  ?                       was introduced through the mouth, and advanced to the  ?                       third part of duodenum. After obtaining informed  ?                       consent, the endoscope was passed under direct vision.  ?                       Throughout the procedure, the patient's blood  ?                       pressure, pulse, and oxygen saturations were monitored  ?                       continuously.The upper GI endoscopy was accomplished  ?                       without difficulty. The patient  tolerated the  ?                       procedure well. ?Findings: ?     The duodenal bulb, second portion of the duodenum and third portion of  ?     the duodenum were normal. ?     A 7 cm hiatal hernia was present. ?     Diffuse mildly erythematous mucosa without bleeding was found in the  ?     gastric fundus. Using the endoscope, the video capsule enteroscope was  ?     advanced into the first portion of the duodenum. ?     The gastroesophageal junction and examined esophagus were normal. ?     A non-bleeding diverticulum was found in the second portion of the  ?     duodenum. ?Impression:            - Normal duodenal bulb, second portion of the duodenum  ?                       and third portion of the duodenum. ?                       - 7 cm hiatal hernia. ?                       - Erythematous mucosa in the gastric fundus. ?                       - Normal gastroesophageal junction and esophagus. ?                       - No specimens collected. ?                       - Successful completion of the Video Capsule  ?                       Enteroscope placement. ?Recommendation:        -  Proceed with colonoscopy as scheduled ?                       See colonoscopy report ?Procedure Code(s):     --- Professional --- ?                       [phone], Esophagogastroduodenoscopy, flexible,  ?                       transoral; diagnostic, including collection of  ?                       specimen(s) by brushing or washing, when performed  ?                       (separate procedure) ?Diagnosis Code(s):     --- Professional --- ?                       K44.9, Diaphragmatic hernia without obstruction or  ?                       gangrene ?                       K31.89, Other diseases of stomach and duodenum ?                       D50.0, Iron deficiency anemia secondary to blood loss  ?                       (chronic) ?                       D50.9, Iron deficiency anemia, unspecified ?CPT copyright 2019 American Medical  Association. All rights reserved. ?The codes documented in this report are preliminary and upon coder review may  ?be revised to meet current compliance requirements. ?Dr. Ulyess Mort ?Robertt Buda Raeanne Gathers MD, MD ?08/01/2021 8:10:42 AM ?This report has been signed electronically. ?Number of Addenda: 0 ?Note Initiated On: 08/01/2021 7:15 AM ?Estimated Blood Loss:  Estimated blood loss: none. Estimated blood loss: none. ?     [facility] ?

## 2021-08-01 NOTE — Transfer of Care (Signed)
Immediate Anesthesia Transfer of Care Note ? ?Patient: Meghan Moody ? ?Procedure(s) Performed: ESOPHAGOGASTRODUODENOSCOPY (EGD) WITH PROPOFOL ?COLONOSCOPY WITH PROPOFOL ?GIVENS CAPSULE STUDY ? ?Patient Location: PACU ? ?Anesthesia Type:General ? ?Level of Consciousness: awake and alert  ? ?Airway & Oxygen Therapy: Patient Spontanous Breathing ? ?Post-op Assessment: Report given to RN and Post -op Vital signs reviewed and stable ? ?Post vital signs: Reviewed and stable ? ?Last Vitals:  ?Vitals Value Taken Time  ?BP    ?Temp    ?Pulse 65 08/01/21 0825  ?Resp 15 08/01/21 0825  ?SpO2 98 % 08/01/21 0825  ?Vitals shown include unvalidated device data. ? ?Last Pain:  ?Vitals:  ? 08/01/21 0718  ?TempSrc: Temporal  ?PainSc: 0-No pain  ?   ? ?  ? ?Complications: No notable events documented. ?

## 2021-08-01 NOTE — Op Note (Signed)
[facility] ?Gastroenterology ?Patient Name: Meghan Moody ?Procedure Date: 08/01/2021 7:14 AM ?MRN: [id number] ?Account #: [id number] ?Date of Birth: July 23, 1945 ?Admit Type: Outpatient ?Age: 76 ?Room: [facility] ENDO ROOM 3 ?Gender: Female ?Note Status: Finalized ?Instrument Name: Peds Colonoscope [id number] ?Procedure:             Colonoscopy ?Indications:           Last colonoscopy: July 2021, Iron deficiency anemia  ?                       secondary to chronic blood loss, Unexplained iron  ?                       deficiency anemia ?Providers:             Lin Landsman MD, MD ?Referring MD:          Venia Carbon (Referring MD) ?Medicines:             General Anesthesia ?Complications:         No immediate complications. Estimated blood loss: None. ?Procedure:             Pre-Anesthesia Assessment: ?                       - Prior to the procedure, a History and Physical was  ?                       performed, and patient medications and allergies were  ?                       reviewed. The patient is competent. The risks and  ?                       benefits of the procedure and the sedation options and  ?                       risks were discussed with the patient. All questions  ?                       were answered and informed consent was obtained.  ?                       Patient identification and proposed procedure were  ?                       verified by the physician, the nurse, the  ?                       anesthesiologist, the anesthetist and the technician  ?                       in the pre-procedure area in the procedure room in the  ?                       endoscopy suite. Mental Status Examination: alert and  ?                       oriented. Airway Examination: normal oropharyngeal  ?  airway and neck mobility. Respiratory Examination:  ?                       clear to auscultation. CV Examination: normal.  ?                       Prophylactic Antibiotics: The  patient does not require  ?                       prophylactic antibiotics. Prior Anticoagulants: The  ?                       patient has taken no previous anticoagulant or  ?                       antiplatelet agents. ASA Grade Assessment: III - A  ?                       patient with severe systemic disease. After reviewing  ?                       the risks and benefits, the patient was deemed in  ?                       satisfactory condition to undergo the procedure. The  ?                       anesthesia plan was to use general anesthesia.  ?                       Immediately prior to administration of medications,  ?                       the patient was re-assessed for adequacy to receive  ?                       sedatives. The heart rate, respiratory rate, oxygen  ?                       saturations, blood pressure, adequacy of pulmonary  ?                       ventilation, and response to care were monitored  ?                       throughout the procedure. The physical status of the  ?                       patient was re-assessed after the procedure. ?                       After obtaining informed consent, the colonoscope was  ?                       passed under direct vision. Throughout the procedure,  ?                       the patient's blood pressure, pulse, and oxygen  ?  saturations were monitored continuously. The  ?                       Colonoscope was introduced through the anus and  ?                       advanced to the the cecum, identified by appendiceal  ?                       orifice and ileocecal valve. The colonoscopy was  ?                       performed without difficulty. The patient tolerated  ?                       the procedure well. The quality of the bowel  ?                       preparation was evaluated using the BBPS [facility] Bowel  ?                       Preparation Scale) with scores of: Right Colon = 3,  ?                       Transverse  Colon = 3 and Left Colon = 3 (entire mucosa  ?                       seen well with no residual staining, small fragments  ?                       of stool or opaque liquid). The total BBPS score  ?                       equals 9. ?Findings: ?     The perianal and digital rectal examinations were normal. Pertinent  ?     negatives include normal sphincter tone and no palpable rectal lesions. ?     Multiple small and large-mouthed diverticula were found in the  ?     recto-sigmoid colon and sigmoid colon. There was no evidence of  ?     diverticular bleeding. ?     A few diverticula were found in the descending colon, transverse colon  ?     and ascending colon. There was no evidence of diverticular bleeding. ?     Non-bleeding external hemorrhoids were found during retroflexion. The  ?     hemorrhoids were medium-sized. ?     The exam was otherwise without abnormality. ?Impression:            - Severe diverticulosis in the recto-sigmoid colon and  ?                       in the sigmoid colon. There was no evidence of  ?                       diverticular bleeding. ?                       - Mild diverticulosis in the descending colon, in the  ?  transverse colon and in the ascending colon. There was  ?                       no evidence of diverticular bleeding. ?                       - Non-bleeding external hemorrhoids. ?                       - The examination was otherwise normal. ?                       - No specimens collected. ?Recommendation:        - Discharge patient to home (with escort). ?                       - Resume previous diet today. ?                       - Continue present medications. ?                       - Return to my office as previously scheduled. ?Procedure Code(s):     --- Professional --- ?                       [id number], Colonoscopy, flexible; diagnostic, including  ?                       collection of specimen(s) by brushing or washing, when  ?                        performed (separate procedure) ?Diagnosis Code(s):     --- Professional --- ?                       K64.4, Residual hemorrhoidal skin tags ?                       D50.0, Iron deficiency anemia secondary to blood loss  ?                       (chronic) ?                       D50.9, Iron deficiency anemia, unspecified ?                       K57.30, Diverticulosis of large intestine without  ?                       perforation or abscess without bleeding ?CPT copyright 2019 American Medical Association. All rights reserved. ?The codes documented in this report are preliminary and upon coder review may  ?be revised to meet current compliance requirements. ?Dr. Ulyess Mort ?Aloysious Vangieson Raeanne Gathers MD, MD ?08/01/2021 8:21:21 AM ?This report has been signed electronically. ?Number of Addenda: 0 ?Note Initiated On: 08/01/2021 7:14 AM ?Scope Withdrawal Time: 0 hours 3 minutes 14 seconds  ?Total Procedure Duration: 0 hours 6 minutes 19 seconds  ?Estimated Blood Loss:  Estimated blood loss: none. ?     [facility] ?

## 2021-08-02 ENCOUNTER — Encounter: Payer: Self-pay | Admitting: Gastroenterology

## 2021-08-08 ENCOUNTER — Inpatient Hospital Stay: Payer: Medicare PPO | Attending: Oncology

## 2021-08-08 DIAGNOSIS — I10 Essential (primary) hypertension: Secondary | ICD-10-CM | POA: Insufficient documentation

## 2021-08-08 DIAGNOSIS — Z79899 Other long term (current) drug therapy: Secondary | ICD-10-CM | POA: Insufficient documentation

## 2021-08-08 DIAGNOSIS — R252 Cramp and spasm: Secondary | ICD-10-CM | POA: Diagnosis not present

## 2021-08-08 DIAGNOSIS — D509 Iron deficiency anemia, unspecified: Secondary | ICD-10-CM | POA: Insufficient documentation

## 2021-08-08 LAB — CBC WITH DIFFERENTIAL/PLATELET
Abs Immature Granulocytes: 0.02 10*3/uL (ref 0.00–0.07)
Basophils Absolute: 0.1 10*3/uL (ref 0.0–0.1)
Basophils Relative: 1 %
Eosinophils Absolute: 0.1 10*3/uL (ref 0.0–0.5)
Eosinophils Relative: 2 %
HCT: 44.9 % (ref 36.0–46.0)
Hemoglobin: 14.2 g/dL (ref 12.0–15.0)
Immature Granulocytes: 0 %
Lymphocytes Relative: 16 %
Lymphs Abs: 1.3 10*3/uL (ref 0.7–4.0)
MCH: 29.7 pg (ref 26.0–34.0)
MCHC: 31.6 g/dL (ref 30.0–36.0)
MCV: 93.9 fL (ref 80.0–100.0)
Monocytes Absolute: 0.5 10*3/uL (ref 0.1–1.0)
Monocytes Relative: 6 %
Neutro Abs: 6.1 10*3/uL (ref 1.7–7.7)
Neutrophils Relative %: 75 %
Platelets: 270 10*3/uL (ref 150–400)
RBC: 4.78 MIL/uL (ref 3.87–5.11)
RDW: 14.6 % (ref 11.5–15.5)
WBC: 8.2 10*3/uL (ref 4.0–10.5)
nRBC: 0 % (ref 0.0–0.2)

## 2021-08-08 LAB — IRON AND TIBC
Iron: 44 ug/dL (ref 28–170)
Saturation Ratios: 10 % — ABNORMAL LOW (ref 10.4–31.8)
TIBC: 427 ug/dL (ref 250–450)
UIBC: 383 ug/dL

## 2021-08-08 LAB — FERRITIN: Ferritin: 14 ng/mL (ref 11–307)

## 2021-08-10 ENCOUNTER — Telehealth: Payer: Self-pay

## 2021-08-10 ENCOUNTER — Inpatient Hospital Stay: Payer: Medicare PPO | Admitting: Medical Oncology

## 2021-08-10 ENCOUNTER — Inpatient Hospital Stay: Payer: Medicare PPO

## 2021-08-10 ENCOUNTER — Encounter: Payer: Self-pay | Admitting: Medical Oncology

## 2021-08-10 VITALS — BP 142/57 | HR 66 | Temp 97.1°F | Resp 18 | Wt 136.5 lb

## 2021-08-10 VITALS — BP 130/68 | HR 66

## 2021-08-10 DIAGNOSIS — R252 Cramp and spasm: Secondary | ICD-10-CM

## 2021-08-10 DIAGNOSIS — I159 Secondary hypertension, unspecified: Secondary | ICD-10-CM

## 2021-08-10 DIAGNOSIS — D509 Iron deficiency anemia, unspecified: Secondary | ICD-10-CM | POA: Diagnosis not present

## 2021-08-10 DIAGNOSIS — K633 Ulcer of intestine: Secondary | ICD-10-CM

## 2021-08-10 MED ORDER — SODIUM CHLORIDE 0.9 % IV SOLN
INTRAVENOUS | Status: DC | PRN
  Filled 2021-08-10: qty 250

## 2021-08-10 MED ORDER — IRON SUCROSE 20 MG/ML IV SOLN
200.0000 mg | Freq: Once | INTRAVENOUS | Status: AC
  Administered 2021-08-10: 200 mg via INTRAVENOUS
  Filled 2021-08-10: qty 10

## 2021-08-10 NOTE — Telephone Encounter (Signed)
-----   Message from Lin Landsman, MD sent at 08/09/2021  5:37 PM EDT ----- ?Please inform patient that the video capsule endoscopy showed several small shallow ulcers in her lower part of the small intestine which could have contributed to iron deficiency anemia.  There was no presence of active bleeding.  Please ask her if she is taking any BC powders/Goody powder or any NSAIDs like aspirin, ibuprofen, Advil, Aleve or Motrin or meloxicam.  I recommend to check fecal calprotectin levels to evaluate for any underlying Crohn's disease ? ?I also recommend referral to [facility] surgery, Dr. Nadeen Landau to repair large hiatal hernia if patient is agreeable ? ?Rohini Vanga ?

## 2021-08-10 NOTE — Progress Notes (Signed)
?[place]  ?[phone] [phone] ? ?ID: Meghan Moody OB: 1945-04-25  MR#: [id number]  [id number] ? ?Patient Care Team: ?Venia Carbon, MD as PCP - General ?Venia Carbon, MD as Referring Physician (Pediatrics) ?Lloyd Huger, MD as Consulting Physician (Oncology) ? ?CHIEF COMPLAINT: Iron deficiency anemia. ? ?INTERVAL HISTORY: Patient returns to clinic today for repeat laboratory work, further evaluation, consideration of additional IV Venofer. She has iron deficiency anemia. Recently had an EGD and colonoscopy on 08/01/2021 which showed a hiatal hernia and diverticula but no sign of bleeding. Today she states that she still struggles with leg cramps with exercise. No other concerns. No bleeding episodes, stool changes. No chest pains.  ? ?REVIEW OF SYSTEMS:   ?Review of Systems  ?Constitutional:  Positive for malaise/fatigue. Negative for fever and weight loss.  ?Respiratory: Negative.  Negative for cough, hemoptysis and shortness of breath.   ?Cardiovascular: Negative.  Negative for chest pain and leg swelling.  ?Gastrointestinal: Negative.  Negative for abdominal pain, blood in stool and melena.  ?Genitourinary: Negative.  Negative for hematuria.  ?Musculoskeletal: Negative.  Negative for back pain.  ?Skin: Negative.  Negative for rash.  ?Neurological: Negative.  Negative for dizziness, focal weakness, weakness and headaches.  ?Psychiatric/Behavioral: Negative.  The patient is not nervous/anxious.   ? ?As per HPI. Otherwise, a complete review of systems is negative. ? ?PAST MEDICAL HISTORY: ?Past Medical History:  ?Diagnosis Date  ? Anemia   ? Chronic bronchitis ([place])   ? Chronic cough   ? Deafness in left ear   ? GERD (gastroesophageal reflux disease)   ? H/O hemorrhoidectomy   ? Hiatal hernia   ? Hyperlipidemia   ? Hypertension   ? SOB (shortness of breath) on exertion   ? ? ?PAST SURGICAL HISTORY: ?Past Surgical History:  ?Procedure Laterality  Date  ? BREAST CYST ASPIRATION Bilateral   ? neg  ? BREAST EXCISIONAL BIOPSY Right 1978  ? neg  ? BUNIONECTOMY    ? CATARACT EXTRACTION W/PHACO Right 03/30/2019  ? Procedure: CATARACT EXTRACTION PHACO AND INTRAOCULAR LENS PLACEMENT (IOC) RIGHT, 1.23, 00:20.8;  Surgeon: Eulogio Bear, MD;  Location: [place];  Service: Ophthalmology;  Laterality: Right;  ? CATARACT EXTRACTION W/PHACO Left 04/27/2019  ? Procedure: CATARACT EXTRACTION PHACO AND INTRAOCULAR LENS PLACEMENT (IOC) LEFT 1.46  00:20.3;  Surgeon: Eulogio Bear, MD;  Location: [place];  Service: Ophthalmology;  Laterality: Left;  ? COLONOSCOPY WITH PROPOFOL N/A 11/02/2019  ? Procedure: COLONOSCOPY WITH PROPOFOL;  Surgeon: Lin Landsman, MD;  Location[facility] ENDOSCOPY;  Service: Gastroenterology;  Laterality: N/A;  ? COLONOSCOPY WITH PROPOFOL N/A 08/01/2021  ? Procedure: COLONOSCOPY WITH PROPOFOL;  Surgeon: Lin Landsman, MD;  Location: [facility] ENDOSCOPY;  Service: Gastroenterology;  Laterality: N/A;  ? ESOPHAGOGASTRODUODENOSCOPY (EGD) WITH PROPOFOL N/A 11/02/2019  ? Procedure: ESOPHAGOGASTRODUODENOSCOPY (EGD) WITH PROPOFOL;  Surgeon: Lin Landsman, MD;  Location: [facility] ENDOSCOPY;  Service: Gastroenterology;  Laterality: N/A;  ? ESOPHAGOGASTRODUODENOSCOPY (EGD) WITH PROPOFOL N/A 08/01/2021  ? Procedure: ESOPHAGOGASTRODUODENOSCOPY (EGD) WITH PROPOFOL;  Surgeon: Lin Landsman, MD;  Location: [facility] ENDOSCOPY;  Service: Gastroenterology;  Laterality: N/A;  with Capsule placement  ? GIVENS CAPSULE STUDY N/A 08/01/2021  ? Procedure: GIVENS CAPSULE STUDY;  Surgeon: Lin Landsman, MD;  Location: [facility] ENDOSCOPY;  Service: Gastroenterology;  Laterality: N/A;  ? TUBAL LIGATION    ? ? ?FAMILY HISTORY: ?Family History  ?Problem Relation Age of Onset  ? Coronary artery disease Mother   ?  Heart disease Mother   ? Hypertension Mother   ? Stroke Father   ? Drug abuse Brother   ? ? ?ADVANCED DIRECTIVES (Y/N):  N ? ?HEALTH  MAINTENANCE: ?Social History  ? ?Tobacco Use  ? Smoking status: Former  ?  Packs/day: 0.30  ?  Years: 40.00  ?  Pack years: 12.00  ?  Types: Cigarettes  ?  Quit date: 04/16/2006  ?  Years since quitting: 15.3  ? Smokeless tobacco: Never  ?Vaping Use  ? Vaping Use: Never used  ?Substance Use Topics  ? Alcohol use: No  ? Drug use: No  ? ? ? Colonoscopy: ? PAP: ? Bone density: ? Lipid panel: ? ?No Known Allergies ? ?Current Outpatient Medications  ?Medication Sig Dispense Refill  ? acetaminophen (TYLENOL) 500 MG tablet Take 500 mg by mouth every 8 (eight) hours as needed.    ? CEQUA 0.09 % SOLN SMARTSIG:2 In Eye(s) Twice Daily    ? cyclobenzaprine (FLEXERIL) 5 MG tablet Take 5 mg by mouth 3 (three) times daily as needed for muscle spasms.    ? diphenhydrAMINE (BENADRYL) 25 MG tablet Take 25 mg by mouth at bedtime.    ? Multiple Vitamins-Minerals (HAIR SKIN AND NAILS FORMULA PO) Take by mouth.    ? omeprazole (PRILOSEC) 40 MG capsule TAKE 1 CAPSULE BY MOUTH EVERY DAY 90 capsule 3  ? RESTASIS 0.05 % ophthalmic emulsion 1 drop 2 (two) times daily.    ? triamterene-hydrochlorothiazide (MAXZIDE-25) 37.5-25 MG tablet Take 0.5 tablets by mouth daily. 45 tablet 3  ? Lifitegrast (XIIDRA) 5 % SOLN Apply 1 drop to eye 2 (two) times daily. 1 drop each eye daily (Patient not taking: Reported on 08/10/2021)    ? ?No current facility-administered medications for this visit.  ? ? ?OBJECTIVE: ?Vitals:  ? 08/10/21 1353  ?BP: (!) 142/57  ?Pulse: 66  ?Resp: 18  ?Temp: (!) 97.1 ?F (36.2 ?C)  ?SpO2: 100%  ?   Body mass index is 22.71 kg/m?Marland Kitchen    ECOG FS:0 - Asymptomatic ? ?General: Well-developed, well-nourished, no acute distress. ?Eyes: Pink conjunctiva, anicteric sclera. ?Lungs: Clear to auscultation bilaterally.  No audible wheezing or coughing ?Heart: Regular rate and rhythm. 1+ bilateral pedal pulse ?Abdomen: Soft, nontender, nondistended.  ?Musculoskeletal: No edema, cyanosis, or clubbing. ?Neuro: Alert, answering all questions  appropriately. Cranial nerves grossly intact. ?Skin: No rashes or petechiae noted. Scattered varicose veins of bilateral lower legs  ?Psych: Normal affect. ? ? ?LAB RESULTS: ? ?Lab Results  ?Component Value Date  ? NA 140 01/08/2020  ? K 4.2 01/08/2020  ? CL 102 01/08/2020  ? CO2 29 01/08/2020  ? GLUCOSE 85 01/08/2020  ? BUN 31 (H) 01/08/2020  ? CREATININE 1.09 01/08/2020  ? CALCIUM 10.0 01/08/2020  ? PROT 6.6 08/07/2019  ? ALBUMIN 4.5 01/08/2020  ? AST 11 08/07/2019  ? ALT 9 08/07/2019  ? ALKPHOS 84 08/07/2019  ? BILITOT 0.4 08/07/2019  ? GFRNONAA 42 (L) 08/29/2015  ? GFRAA 49 (L) 08/29/2015  ? ? ?Lab Results  ?Component Value Date  ? WBC 8.2 08/08/2021  ? NEUTROABS 6.1 08/08/2021  ? HGB 14.2 08/08/2021  ? HCT 44.9 08/08/2021  ? MCV 93.9 08/08/2021  ? PLT 270 08/08/2021  ? ?Lab Results  ?Component Value Date  ? IRON 44 08/08/2021  ? TIBC 427 08/08/2021  ? IRONPCTSAT 10 (L) 08/08/2021  ? ?Lab Results  ?Component Value Date  ? FERRITIN 14 08/08/2021  ? ? ? ?STUDIES: ?No results found. ? ?  ASSESSMENT: Iron deficiency anemia. ? ?PLAN:   ? ?1.  Iron deficiency anemia: Etiology unclear though may be secondary to poor oral absorption from long term PPI use. Clinically, she is asymptomatic of blood loss. Hemoglobin is normal today at 14.2 but ferritin is very low at 14. Has tolerated Venofer well in the past and would like to proceed forward. Recommend venofer x 4. She will return to clinic in 3 months for labs (cbc, cmp, ferritin, iron studies) then day to week later follow up with Dr. Grayland Ormond for possible venofer.  ? ?2.  Hypertension: Moderate.  Continue monitoring and treatment per primary care. Normal pressure today.  ? ?3. Exercise induced leg cramps. Still occurring with hemoglobin correction. Referring to vascular for evaluation.  ? ?Disposition:  ?Venofer x 4 ?Rtc in 3 months- labs (cbc, ferritin, iron studies) ?Day to week later see Dr Grayland Ormond, +/- venofer ? ?Patient expressed understanding and was in agreement  with this plan. She also understands that She can call clinic at any time with any questions, concerns, or complaints.  ? ?Hughie Closs, PA-C   08/10/2021 2:29 PM ? ? ? ? ?

## 2021-08-10 NOTE — Telephone Encounter (Signed)
Patient verbalized understanding. She states she will come today to pick up stool kit. Patient states she is okay with the referral and talk to Dr. Dema Severin about the hernia. Patient states she only takes Tylenol for pain as needed  ?

## 2021-08-10 NOTE — Progress Notes (Signed)
Patient here today for follow up regarding anemia. Patient reports she had GI work up last week, results of capsule study showed hernia. Patient reports increased fatigue and weakness in her legs.  ?

## 2021-08-13 NOTE — Anesthesia Postprocedure Evaluation (Signed)
Anesthesia Post Note ? ?Patient: Meghan Moody ? ?Procedure(s) Performed: ESOPHAGOGASTRODUODENOSCOPY (EGD) WITH PROPOFOL ?COLONOSCOPY WITH PROPOFOL ?GIVENS CAPSULE STUDY ? ?Patient location during evaluation: PACU ?Anesthesia Type: General ?Level of consciousness: awake and alert ?Pain management: pain level controlled ?Vital Signs Assessment: post-procedure vital signs reviewed and stable ?Respiratory status: spontaneous breathing, nonlabored ventilation, respiratory function stable and patient connected to nasal cannula oxygen ?Cardiovascular status: blood pressure returned to baseline and stable ?Postop Assessment: no apparent nausea or vomiting ?Anesthetic complications: no ? ? ?No notable events documented. ? ? ?Last Vitals:  ?Vitals:  ? 08/01/21 0850 08/01/21 0856  ?BP:  (!) 154/72  ?Pulse: (!) 55 (!) 55  ?Resp: 14 13  ?Temp:    ?SpO2: 99% 97%  ?  ?Last Pain:  ?Vitals:  ? 08/01/21 0718  ?TempSrc: Temporal  ?PainSc: 0-No pain  ? ? ?  ?  ?  ?  ?  ?  ? ?[name] ? ? ? ? ?

## 2021-08-18 ENCOUNTER — Inpatient Hospital Stay: Payer: Medicare PPO | Attending: Oncology

## 2021-08-18 VITALS — BP 121/62 | HR 67 | Temp 97.3°F | Resp 18

## 2021-08-18 DIAGNOSIS — D509 Iron deficiency anemia, unspecified: Secondary | ICD-10-CM | POA: Diagnosis present

## 2021-08-18 MED ORDER — SODIUM CHLORIDE 0.9 % IV SOLN
Freq: Once | INTRAVENOUS | Status: AC
  Filled 2021-08-18: qty 250

## 2021-08-18 MED ORDER — IRON SUCROSE 20 MG/ML IV SOLN
200.0000 mg | Freq: Once | INTRAVENOUS | Status: AC
  Administered 2021-08-18: 200 mg via INTRAVENOUS
  Filled 2021-08-18: qty 10

## 2021-08-23 ENCOUNTER — Encounter: Payer: Self-pay | Admitting: Internal Medicine

## 2021-08-23 ENCOUNTER — Ambulatory Visit (INDEPENDENT_AMBULATORY_CARE_PROVIDER_SITE_OTHER): Payer: Medicare PPO | Admitting: Internal Medicine

## 2021-08-23 VITALS — BP 118/80 | HR 84 | Temp 97.5°F | Ht 65.0 in | Wt 134.0 lb

## 2021-08-23 DIAGNOSIS — Z0000 Encounter for general adult medical examination without abnormal findings: Secondary | ICD-10-CM | POA: Diagnosis not present

## 2021-08-23 DIAGNOSIS — D509 Iron deficiency anemia, unspecified: Secondary | ICD-10-CM

## 2021-08-23 DIAGNOSIS — N1831 Chronic kidney disease, stage 3a: Secondary | ICD-10-CM

## 2021-08-23 DIAGNOSIS — I779 Disorder of arteries and arterioles, unspecified: Secondary | ICD-10-CM | POA: Insufficient documentation

## 2021-08-23 DIAGNOSIS — I10 Essential (primary) hypertension: Secondary | ICD-10-CM

## 2021-08-23 DIAGNOSIS — K219 Gastro-esophageal reflux disease without esophagitis: Secondary | ICD-10-CM

## 2021-08-23 DIAGNOSIS — I739 Peripheral vascular disease, unspecified: Secondary | ICD-10-CM

## 2021-08-23 DIAGNOSIS — N2581 Secondary hyperparathyroidism of renal origin: Secondary | ICD-10-CM

## 2021-08-23 LAB — RENAL FUNCTION PANEL
Albumin: 4.3 g/dL (ref 3.5–5.2)
BUN: 26 mg/dL — ABNORMAL HIGH (ref 6–23)
CO2: 28 mEq/L (ref 19–32)
Calcium: 9.2 mg/dL (ref 8.4–10.5)
Chloride: 103 mEq/L (ref 96–112)
Creatinine, Ser: 1.06 mg/dL (ref 0.40–1.20)
GFR: 50.96 mL/min — ABNORMAL LOW (ref >60.00)
Glucose, Bld: 86 mg/dL (ref 70–99)
Phosphorus: 2.1 mg/dL — ABNORMAL LOW (ref 2.3–4.6)
Potassium: 3.7 mEq/L (ref 3.5–5.1)
Sodium: 139 mEq/L (ref 135–145)

## 2021-08-23 LAB — HEPATIC FUNCTION PANEL
ALT: 18 U/L (ref 0–35)
AST: 22 U/L (ref 0–37)
Albumin: 4.3 g/dL (ref 3.5–5.2)
Alkaline Phosphatase: 98 U/L (ref 39–117)
Bilirubin, Direct: 0 mg/dL (ref 0.0–0.3)
Total Bilirubin: 0.3 mg/dL (ref 0.2–1.2)
Total Protein: 6.9 g/dL (ref 6.0–8.3)

## 2021-08-23 LAB — VITAMIN D 25 HYDROXY (VIT D DEFICIENCY, FRACTURES): VITD: 34.43 ng/mL (ref 30.00–100.00)

## 2021-08-23 NOTE — Progress Notes (Signed)
? ?Subjective:  ? ? Patient ID: Meghan DemarkJean B Moody, female    DOB: 09-06-1945, 76 y.o.   MRN: [id number] ? ?HPI ?Here for Medicare wellness visit and follow up of chronic health conditions ?Reviewed advanced directives ?Reviewed other doctors---Dr Danie BinderVanga--GI, Dr Achilles DunkFinnegan--hematology,new dentist, Dr Peyton NajjarKing--ophthal (and separate doctor for dry eye) ?No hospitalizations or surgery in the past year ?Vision is okay ?No hearing in left ear--right okay ?No alcohol or tobacco ?Walks the dog daily--discussed adding resistance training ?No falls ?No depression or anhedonia ?Independent with instrumental ADLs ?No memory problems ? ?No answers about the anemia ?Recent colonoscopy and EGD okay---but does have a hiatal hernia (and may be referred for this) ?Continues on omeprazole ?No heartburn or dysphagia on this ? ?Hemoglobin better but ferritin still low ?Gets tired legs when walking--any uphill ?Some burning---improves once she gets over the top of hill ? ?Last GFR 48 ? ?No chest pain ?No palpitations ?No SOB ?No dizziness or syncope ?No edema ?No headaches ? ?Current Outpatient Medications on File Prior to Visit  ?Medication Sig Dispense Refill  ? acetaminophen (TYLENOL) 500 MG tablet Take 500 mg by mouth every 8 (eight) hours as needed.    ? cyclobenzaprine (FLEXERIL) 5 MG tablet Take 5 mg by mouth 3 (three) times daily as needed for muscle spasms.    ? diphenhydrAMINE (BENADRYL) 25 MG tablet Take 25 mg by mouth at bedtime.    ? Lifitegrast (XIIDRA) 5 % SOLN Apply 1 drop to eye 2 (two) times daily. 1 drop each eye daily    ? Multiple Vitamin (MULTIVITAMIN) tablet Take 1 tablet by mouth daily.    ? omeprazole (PRILOSEC) 40 MG capsule TAKE 1 CAPSULE BY MOUTH EVERY DAY 90 capsule 3  ? OVER THE COUNTER MEDICATION Toe Fungus Medication    ? triamterene-hydrochlorothiazide (MAXZIDE-25) 37.5-25 MG tablet Take 0.5 tablets by mouth daily. 45 tablet 3  ? CEQUA 0.09 % SOLN SMARTSIG:2 In Eye(s) Twice Daily (Patient not taking: Reported on  08/23/2021)    ? RESTASIS 0.05 % ophthalmic emulsion 1 drop 2 (two) times daily. (Patient not taking: Reported on 08/23/2021)    ? ?No current facility-administered medications on file prior to visit.  ? ? ?No Known Allergies ? ?Past Medical History:  ?Diagnosis Date  ? Anemia   ? Chronic bronchitis ([facility])   ? Chronic cough   ? Deafness in left ear   ? GERD (gastroesophageal reflux disease)   ? H/O hemorrhoidectomy   ? Hiatal hernia   ? Hyperlipidemia   ? Hypertension   ? SOB (shortness of breath) on exertion   ? ? ?Past Surgical History:  ?Procedure Laterality Date  ? BREAST CYST ASPIRATION Bilateral   ? neg  ? BREAST EXCISIONAL BIOPSY Right 1978  ? neg  ? BUNIONECTOMY    ? CATARACT EXTRACTION W/PHACO Right 03/30/2019  ? Procedure: CATARACT EXTRACTION PHACO AND INTRAOCULAR LENS PLACEMENT (IOC) RIGHT, 1.23, 00:20.8;  Surgeon: Nevada CraneKing, Bradley Mark, MD;  [facility];  Service: Ophthalmology;  Laterality: Right;  ? CATARACT EXTRACTION W/PHACO Left 04/27/2019  ? Procedure: CATARACT EXTRACTION PHACO AND INTRAOCULAR LENS PLACEMENT (IOC) LEFT 1.46  00:20.3;  Surgeon: Nevada CraneKing, Bradley Mark, MD;  [facility];  Service: Ophthalmology;  Laterality: Left;  ? COLONOSCOPY WITH PROPOFOL N/A 11/02/2019  ? Procedure: COLONOSCOPY WITH PROPOFOL;  Surgeon: Toney ReilVanga, Rohini Reddy, MD;  [facility];  Service: Gastroenterology;  Laterality: N/A;  ? COLONOSCOPY WITH PROPOFOL N/A 08/01/2021  ? Procedure: COLONOSCOPY WITH PROPOFOL;  Surgeon:  Toney Reil, MD;  Location: [facility] ENDOSCOPY;  Service: Gastroenterology;  Laterality: N/A;  ? ESOPHAGOGASTRODUODENOSCOPY (EGD) WITH PROPOFOL N/A 11/02/2019  ? Procedure: ESOPHAGOGASTRODUODENOSCOPY (EGD) WITH PROPOFOL;  Surgeon: Toney Reil, MD;  Location: [facility] ENDOSCOPY;  Service: Gastroenterology;  Laterality: N/A;  ? ESOPHAGOGASTRODUODENOSCOPY (EGD) WITH PROPOFOL N/A 08/01/2021  ? Procedure: ESOPHAGOGASTRODUODENOSCOPY (EGD) WITH PROPOFOL;  Surgeon: Toney Reil, MD;  Location: [facility] ENDOSCOPY;  Service: Gastroenterology;  Laterality: N/A;  with Capsule placement  ? GIVENS CAPSULE STUDY N/A 08/01/2021  ? Procedure: GIVENS CAPSULE STUDY;  Surgeon: Toney Reil, MD;  Location: [facility] ENDOSCOPY;  Service: Gastroenterology;  Laterality: N/A;  ? TUBAL LIGATION    ? ? ?Family History  ?Problem Relation Age of Onset  ? Coronary artery disease Mother   ? Heart disease Mother   ? Hypertension Mother   ? Stroke Father   ? Drug abuse Brother   ? ? ?Social History  ? ?Socioeconomic History  ? Marital status: Widowed  ?  Spouse name: Not on file  ? Number of children: 1  ? Years of education: Not on file  ? Highest education level: Not on file  ?Occupational History  ? 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 Smoking status: Former  ?  Packs/day: 0.30  ?  Years: 40.00  ?  Pack years: 12.00  ?  Types: Cigarettes  ?  Quit date: 04/16/2006  ?  Years since quitting: 15.3  ?  Passive exposure: Current  ? Smokeless tobacco: Never  ?Vaping Use  ? Vaping Use: Never used  ?Substance and Sexual Activity  ? Alcohol use: No  ? Drug use: No  ? Sexual activity: Not on file  ?Other Topics Concern  ? Not on file  ?Social History Narrative  ? Widowed, 1 son  ? New relationship since 2013  ?   ? Has living will  ? Son [name] is health care POA  ? Would accept resuscitation attempts but no prolonged ventilation.  ? Would probably not accept tube feeds  ? ?Social Determinants of Health  ? ?Financial Resource Strain: Not on file  ?Food Insecurity: Not on file  ?Transportation Needs: Not on file  ?Physical Activity: Not on file  ?Stress: Not on file  ?Social Connections: Not on file  ?Intimate Partner Violence: Not on file  ? ?Review of Systems ?Has had some trouble sleeping---benedryl helps but sometimes cloudy in the morning. Asked her to stop ?Appetite is good ?Weight is stable ?Wears seat belt ?Teeth are okay--keeps up with dentist ?No suspicious skin lesions ?Bowels  are fine---no blood ?Voids okay---some urgency but no incontinence ?No sig joint pains. Occasional low back pain--especially when working out in the yard ? ?   ?Objective:  ? Physical Exam ?Constitutional:   ?   Appearance: Normal appearance.  ?HENT:  ?   Mouth/Throat:  ?   Comments: No lesions ?Eyes:  ?   Conjunctiva/sclera: Conjunctivae normal.  ?   Pupils: Pupils are equal, round, and reactive to light.  ?Cardiovascular:  ?   Rate and Rhythm: Normal rate and regular rhythm.  ?   Heart sounds: No murmur heard. ?  No gallop.  ?   Comments: Feet warm ?Faint pulse left foot, 1+ on right ?Pulmonary:  ?   Effort: Pulmonary effort is normal.  ?   Breath sounds: Normal breath sounds. No wheezing or rales.  ?Abdominal:  ?   Palpations: Abdomen is soft.  ?   Tenderness:  There is no abdominal tenderness.  ?Musculoskeletal:  ?   Cervical back: Neck supple.  ?   Right lower leg: No edema.  ?   Left lower leg: No edema.  ?Lymphadenopathy:  ?   Cervical: No cervical adenopathy.  ?Skin: ?   Findings: No lesion or rash.  ?Neurological:  ?   General: No focal deficit present.  ?   Mental Status: She is alert and oriented to person, place, and time.  ?   Comments: Mini cog normal  ?Psychiatric:     ?   Mood and Affect: Mood normal.     ?   Behavior: Behavior normal.  ?  ? ? ? ? ?   ?Assessment & Plan:  ? ?

## 2021-08-23 NOTE — Assessment & Plan Note (Signed)
Known hiatal hernia ?Symptoms controlled with omerprazole 40mg  daily ?

## 2021-08-23 NOTE — Assessment & Plan Note (Signed)
I have personally reviewed the Medicare Annual Wellness questionnaire and have noted ?1. The patient's medical and social history ?2. Their use of alcohol, tobacco or illicit drugs ?3. Their current medications and supplements ?4. The patient's functional ability including ADL's, fall risks, home safety risks and hearing or visual ?            impairment. ?5. Diet and physical activities ?6. Evidence for depression or mood disorders ? ?The patients weight, height, BMI and visual acuity have been recorded in the chart ?I have made referrals, counseling and provided education to the patient based review of the above and I have provided the pt with a written personalized care plan for preventive services. ? ?I have provided you with a copy of your personalized plan for preventive services. Please take the time to review along with your updated medication list. ? ?Will plan every other year mammogram till 24 ?Done with colonoscopies ?Discussed adding resistance exercise ?Consider shingrix at the pharmacy ?COVID vaccine and flu in the fall ?

## 2021-08-23 NOTE — Progress Notes (Signed)
Hearing Screening - Comments:: Passed whisper test Vision Screening - Comments:: June 2022  

## 2021-08-23 NOTE — Assessment & Plan Note (Signed)
Hemoglobin better ?Follows with Dr Orlie Dakin ?

## 2021-08-23 NOTE — Assessment & Plan Note (Signed)
BP Readings from Last 3 Encounters:  ?08/23/21 118/80  ?08/18/21 121/62  ?08/10/21 130/68  ? ?Good control on triamterene/HCTZ  37.5/25 ?Will check labs ?

## 2021-08-23 NOTE — Assessment & Plan Note (Signed)
I suspect this is pseudoclaudication from spinal stenosis---but I am not sure ?Will check vascular exam ?

## 2021-08-23 NOTE — Assessment & Plan Note (Signed)
Mild °Will check labs °

## 2021-08-24 ENCOUNTER — Telehealth: Payer: Self-pay

## 2021-08-24 ENCOUNTER — Other Ambulatory Visit: Payer: Self-pay

## 2021-08-24 DIAGNOSIS — N2581 Secondary hyperparathyroidism of renal origin: Secondary | ICD-10-CM | POA: Insufficient documentation

## 2021-08-24 LAB — PARATHYROID HORMONE, INTACT (NO CA): PTH: 96 pg/mL — ABNORMAL HIGH (ref 16–77)

## 2021-08-24 LAB — CALPROTECTIN, FECAL: Calprotectin, Fecal: 271 ug/g — ABNORMAL HIGH (ref 0–120)

## 2021-08-24 NOTE — Assessment & Plan Note (Signed)
Mildly elevated PTH ?Will ask her to add vitamin D 1000 units daily ?

## 2021-08-24 NOTE — Telephone Encounter (Signed)
Called patient and made appointment for Tuesday at 1:45 ?

## 2021-08-24 NOTE — Telephone Encounter (Signed)
-----   Message from Lin Landsman, MD sent at 08/24/2021  9:28 AM EDT ----- ?Caryl Pina ? ?Her calprotectin levels are elevated which goes along with small bowel erosions found on the video capsule study. Please make a follow up visit to discuss next steps, ok to overbook ? ?RV ?

## 2021-08-25 ENCOUNTER — Inpatient Hospital Stay: Payer: Medicare PPO

## 2021-08-25 VITALS — BP 128/70 | HR 68 | Temp 98.8°F

## 2021-08-25 DIAGNOSIS — D509 Iron deficiency anemia, unspecified: Secondary | ICD-10-CM

## 2021-08-25 MED ORDER — IRON SUCROSE 20 MG/ML IV SOLN
200.0000 mg | Freq: Once | INTRAVENOUS | Status: AC
  Administered 2021-08-25: 200 mg via INTRAVENOUS
  Filled 2021-08-25: qty 10

## 2021-08-25 MED ORDER — SODIUM CHLORIDE 0.9 % IV SOLN
INTRAVENOUS | Status: DC
  Filled 2021-08-25 (×2): qty 250

## 2021-08-29 ENCOUNTER — Ambulatory Visit: Payer: Medicare PPO | Admitting: Gastroenterology

## 2021-08-29 ENCOUNTER — Encounter: Payer: Self-pay | Admitting: Gastroenterology

## 2021-08-29 ENCOUNTER — Telehealth: Payer: Self-pay

## 2021-08-29 VITALS — BP 135/76 | HR 70 | Temp 98.0°F | Ht 65.0 in | Wt 132.0 lb

## 2021-08-29 DIAGNOSIS — K529 Noninfective gastroenteritis and colitis, unspecified: Secondary | ICD-10-CM

## 2021-08-29 MED ORDER — BUDESONIDE 3 MG PO CPEP: 9.0000 mg | ORAL_CAPSULE | Freq: Every day | ORAL | 2 refills | Status: DC

## 2021-08-29 NOTE — Telephone Encounter (Signed)
Budesonide was approved by patient insurance company  ?

## 2021-08-29 NOTE — Progress Notes (Signed)
?  ?[name], MD ?[address]  ?[address]  ?[place], [place] [address]  ?Main: [phone]  ?Fax: [phone] ? ? ? ?Gastroenterology Consultation ? ?Referring Provider:     [name], MD ?Primary Care Physician:  [name], MD ?Primary Gastroenterologist:  Dr. [name] ?Reason for Consultation:     Recurrent iron deficiency anemia ?      ? HPI:   ?Meghan Moody is a 76 y.o. female referred by Dr. [name], MD  for consultation & management of recurrent iron deficiency anemia and history of chronic GERD ? ?Chronic iron deficiency anemia: Patient has history of iron deficiency anemia since 08/2015, she was on oral iron in the past.  Most recently on 08/07/2019, patient to follow-up with her PCP, [name], she was complaining of exertional dyspnea, chest pain, underwent work-up, found to have hemoglobin of 6.5, MCV 59.9, ferritin 1.8.  Patient reports that she has been symptomatic for few months, she was busy taking care of her mom who recently died.  She is then referred to [name] on 4/27, who started her on IV iron.  She also received blood transfusion, her hemoglobin responded to 8.5 ?Since then, she reports feeling significantly better ? ?Patient denies any black stools, rectal bleeding, abdominal pain, weight loss.  She reports that she had noticed black stools several months ago ? ?Chronic GERD: She has been on omeprazole 20 mg daily, recently increased to 40 mg daily due to heartburn.  She denies difficulty swallowing, epigastric pain, nausea or vomiting ? ?Follow-up visit 07/10/2021 ?Patient is here for follow-up of recurrence of iron deficiency anemia at the request of her hematologist.  Patient was evaluated by me in 2021 for iron deficiency anemia, underwent upper endoscopy which revealed 7 cm hiatal hernia.  No evidence of celiac disease, H. pylori.  I started her on long-term PPI.  She also underwent colonoscopy which was unremarkable.  She did not undergo  video capsule endoscopy.  Her iron deficiency anemia has resolved since then with iron replacement therapy until recently when her ferritin level started declining again, along with her hemoglobin.  Most recently her ferritin was 7 and hemoglobin 12.3 in 1/23.  Patient received 5 iron infusions so far.  She has an appointment to see hematology next month.  Patient is taking multivitamin with iron in it and her stools are reported to be dark but not black.  Patient denies any symptoms of anemia such as shortness of breath, fatigue.  She continues to take omeprazole 40 mg daily. ? ?Follow-up visit 08/29/2021 ?Patient is here to discuss about video capsule endoscopy results.  She underwent upper endoscopy and colonoscopy which were unremarkable except for large hiatal hernia.  Subsequently, underwent video capsule endoscopy which revealed several scattered erosions in the distal small bowel.  Subsequently, her fecal calprotectin levels are elevated to 271.  Patient denies any GI symptoms.  She reports that last year after she returned from vacation, she did have diarrhea for 6 weeks which spontaneously resolved.  She currently reports having regular bowel movements.  Her iron deficiency anemia has resolved.  Patient does report that she was told that she had inflammation in her eyes that results in dryness of her eyes, itching, blurring of vision and she was using eyedrops prescribed by her optometrist.  Patient did not have a follow-up with optometrist for more than a year. ? ?She does not smoke or drink alcohol ? ?NSAIDs: None ? ?  Antiplts/Anticoagulants/Anti thrombotics: None ? ?GI Procedures: Colonoscopy in 2002 ?EGD and colonoscopy 11/02/2019 ?- Normal duodenal bulb and second portion of the duodenum. Biopsied. ?- 7 cm hiatal hernia. ?- Normal stomach. Biopsied. ?- Normal gastroesophageal junction and esophagus. ? ?- The examined portion of the ileum was normal. ?- Diverticulosis in the sigmoid colon, in the  descending colon and in the transverse colon. ?- Non-bleeding external hemorrhoids. ?- The examination was otherwise normal. ?- No specimens collected. ? ?DIAGNOSIS:  ?A. DUODENUM; COLD BIOPSY:  ?- ENTERIC MUCOSA WITH PRESERVED VILLOUS ARCHITECTURE AND NO SIGNIFICANT  ?HISTOPATHOLOGIC CHANGE.  ?- NEGATIVE FOR FEATURES OF CELIAC, DYSPLASIA, AND MALIGNANCY.  ? ?B. STOMACH, RANDOM; COLD BIOPSY:  ?- GASTRIC ANTRAL AND OXYNTIC MUCOSA WITH MILD CHRONIC INACTIVE  ?GASTRITIS.  ?- NEGATIVE FOR H. PYLORI, DYSPLASIA, AND MALIGNANCY. ? ?Past Medical History:  ?Diagnosis Date  ? Anemia   ? Chronic bronchitis (HCC)   ? Chronic cough   ? Deafness in left ear   ? GERD (gastroesophageal reflux disease)   ? H/O hemorrhoidectomy   ? Hiatal hernia   ? Hyperlipidemia   ? Hypertension   ? SOB (shortness of breath) on exertion   ? ? ?Past Surgical History:  ?Procedure Laterality Date  ? BREAST CYST ASPIRATION Bilateral   ? neg  ? BREAST EXCISIONAL BIOPSY Right 1978  ? neg  ? BUNIONECTOMY    ? CATARACT EXTRACTION W/PHACO Right 03/30/2019  ? Procedure: CATARACT EXTRACTION PHACO AND INTRAOCULAR LENS PLACEMENT (IOC) RIGHT, 1.23, 00:20.8;  Surgeon: Nevada Crane, MD;  Location: [facility];  Service: Ophthalmology;  Laterality: Right;  ? CATARACT EXTRACTION W/PHACO Left 04/27/2019  ? Procedure: CATARACT EXTRACTION PHACO AND INTRAOCULAR LENS PLACEMENT (IOC) LEFT 1.46  00:20.3;  Surgeon: Nevada Crane, MD;  Location: [facility];  Service: Ophthalmology;  Laterality: Left;  ? COLONOSCOPY WITH PROPOFOL N/A 11/02/2019  ? Procedure: COLONOSCOPY WITH PROPOFOL;  Surgeon: Toney Reil, MD;  Location: [facility] ENDOSCOPY;  Service: Gastroenterology;  Laterality: N/A;  ? COLONOSCOPY WITH PROPOFOL N/A 08/01/2021  ? Procedure: COLONOSCOPY WITH PROPOFOL;  Surgeon: Toney Reil, MD;  Location: [facility] ENDOSCOPY;  Service: Gastroenterology;  Laterality: N/A;  ? ESOPHAGOGASTRODUODENOSCOPY (EGD) WITH PROPOFOL N/A 11/02/2019  ?  Procedure: ESOPHAGOGASTRODUODENOSCOPY (EGD) WITH PROPOFOL;  Surgeon: Toney Reil, MD;  Location: [facility] ENDOSCOPY;  Service: Gastroenterology;  Laterality: N/A;  ? ESOPHAGOGASTRODUODENOSCOPY (EGD) WITH PROPOFOL N/A 08/01/2021  ? Procedure: ESOPHAGOGASTRODUODENOSCOPY (EGD) WITH PROPOFOL;  Surgeon: Toney Reil, MD;  Location: [facility] ENDOSCOPY;  Service: Gastroenterology;  Laterality: N/A;  with Capsule placement  ? GIVENS CAPSULE STUDY N/A 08/01/2021  ? Procedure: GIVENS CAPSULE STUDY;  Surgeon: Toney Reil, MD;  Location: [facility] ENDOSCOPY;  Service: Gastroenterology;  Laterality: N/A;  ? TUBAL LIGATION    ? ? ?Current Outpatient Medications:  ?  acetaminophen (TYLENOL) 500 MG tablet, Take 500 mg by mouth every 8 (eight) hours as needed., Disp: , Rfl:  ?  budesonide (ENTOCORT EC) 3 MG 24 hr capsule, Take 3 capsules (9 mg total) by mouth daily., Disp: 90 capsule, Rfl: 2 ?  cyclobenzaprine (FLEXERIL) 5 MG tablet, Take 5 mg by mouth 3 (three) times daily as needed for muscle spasms., Disp: , Rfl:  ?  diphenhydrAMINE (BENADRYL) 25 MG tablet, Take 25 mg by mouth at bedtime., Disp: , Rfl:  ?  Lifitegrast (XIIDRA) 5 % SOLN, Apply 1 drop to eye 2 (two) times daily. 1 drop each eye daily, Disp: , Rfl:  ?  Multiple Vitamin (MULTIVITAMIN)  tablet, Take 1 tablet by mouth daily., Disp: , Rfl:  ?  omeprazole (PRILOSEC) 40 MG capsule, TAKE 1 CAPSULE BY MOUTH EVERY DAY, Disp: 90 capsule, Rfl: 3 ?  OVER THE COUNTER MEDICATION, Toe Fungus Medication, Disp: , Rfl:  ?  RESTASIS 0.05 % ophthalmic emulsion, 1 drop 2 (two) times daily., Disp: , Rfl:  ?  triamterene-hydrochlorothiazide (MAXZIDE-25) 37.5-25 MG tablet, Take 0.5 tablets by mouth daily., Disp: 45 tablet, Rfl: 3 ?  CEQUA 0.09 % SOLN, SMARTSIG:2 In Eye(s) Twice Daily (Patient not taking: Reported on 08/23/2021), Disp: , Rfl:  ? ? ?Family History  ?Problem Relation Age of Onset  ? Coronary artery disease Mother   ? Heart disease Mother   ? Hypertension Mother   ?  Stroke Father   ? Drug abuse Brother   ?  ? ?Social History  ? ?Tobacco Use  ? Smoking status: Former  ?  Packs/day: 0.30  ?  Years: 40.00  ?  Pack years: 12.00  ?  Types: Cigarettes  ?  Quit date: 04/16/2006  ?  Years

## 2021-08-29 NOTE — Telephone Encounter (Signed)
Submitted PA through cover my meds for Budesonide 3 mg. Waiting on response from insurance company  ?

## 2021-08-29 NOTE — Patient Instructions (Addendum)
Please call [facility] center in [place] to make a eye doctor appointment. [phone] ? ?[facility] number to make appointment is [phone] ?

## 2021-09-01 ENCOUNTER — Inpatient Hospital Stay: Payer: Medicare PPO

## 2021-09-01 VITALS — BP 133/63 | HR 66 | Temp 98.0°F | Resp 18

## 2021-09-01 DIAGNOSIS — D509 Iron deficiency anemia, unspecified: Secondary | ICD-10-CM

## 2021-09-01 MED ORDER — SODIUM CHLORIDE 0.9 % IV SOLN
Freq: Once | INTRAVENOUS | Status: AC
  Filled 2021-09-01: qty 250

## 2021-09-01 MED ORDER — IRON SUCROSE 20 MG/ML IV SOLN
200.0000 mg | Freq: Once | INTRAVENOUS | Status: AC
  Administered 2021-09-01: 200 mg via INTRAVENOUS
  Filled 2021-09-01: qty 10

## 2021-09-01 NOTE — Patient Instructions (Signed)

## 2021-09-08 ENCOUNTER — Inpatient Hospital Stay: Payer: Medicare PPO

## 2021-09-12 ENCOUNTER — Ambulatory Visit: Payer: Medicare PPO

## 2021-09-22 ENCOUNTER — Other Ambulatory Visit: Payer: Self-pay | Admitting: Internal Medicine

## 2021-10-11 ENCOUNTER — Ambulatory Visit (INDEPENDENT_AMBULATORY_CARE_PROVIDER_SITE_OTHER): Payer: Medicare PPO

## 2021-10-11 ENCOUNTER — Other Ambulatory Visit: Payer: Self-pay | Admitting: Internal Medicine

## 2021-10-11 DIAGNOSIS — I739 Peripheral vascular disease, unspecified: Secondary | ICD-10-CM | POA: Diagnosis not present

## 2021-10-13 ENCOUNTER — Ambulatory Visit (INDEPENDENT_AMBULATORY_CARE_PROVIDER_SITE_OTHER): Payer: Medicare PPO

## 2021-10-13 DIAGNOSIS — I739 Peripheral vascular disease, unspecified: Secondary | ICD-10-CM | POA: Diagnosis not present

## 2021-10-18 ENCOUNTER — Encounter: Payer: Self-pay | Admitting: Internal Medicine

## 2021-10-24 ENCOUNTER — Ambulatory Visit: Payer: Medicare PPO | Admitting: Cardiovascular Disease

## 2021-10-24 ENCOUNTER — Encounter: Payer: Self-pay | Admitting: Cardiovascular Disease

## 2021-10-24 VITALS — BP 138/72 | HR 66 | Ht 65.0 in | Wt 131.2 lb

## 2021-10-24 DIAGNOSIS — I739 Peripheral vascular disease, unspecified: Secondary | ICD-10-CM | POA: Diagnosis not present

## 2021-10-24 DIAGNOSIS — I10 Essential (primary) hypertension: Secondary | ICD-10-CM | POA: Diagnosis not present

## 2021-10-24 DIAGNOSIS — E785 Hyperlipidemia, unspecified: Secondary | ICD-10-CM | POA: Diagnosis not present

## 2021-10-24 MED ORDER — ASPIRIN 81 MG PO TBEC: 81.0000 mg | DELAYED_RELEASE_TABLET | Freq: Every day | ORAL | Status: AC

## 2021-10-24 MED ORDER — ATORVASTATIN CALCIUM 40 MG PO TABS: 40.0000 mg | ORAL_TABLET | Freq: Every day | ORAL | 5 refills | Status: DC

## 2021-10-24 MED ORDER — CILOSTAZOL 50 MG PO TABS: 50.0000 mg | ORAL_TABLET | Freq: Two times a day (BID) | ORAL | 5 refills | Status: DC

## 2021-10-24 NOTE — Patient Instructions (Signed)
Medication Instructions:  Your physician has recommended you make the following change in your medication:   - START Aspirin 81 mg daily. This can be purchased over the counter. - START Pletal 50 mg twice daily. An Rx has been sent to your pharmacy. - START Atorvastatin 40 mg daily. An Rx has been sent to your pharmacy.   *If you need a refill on your cardiac medications before your next appointment, please call your pharmacy*   Lab Work: Your physician recommends that you return for a FASTING lipid profile and lft: in 6 weeks.  Please have your labs drawn at the [facility]. No appt needed. Lab hours are Mon-Fri 7am-6pm. Stop at the Registration desk to check in.   If you have labs (blood work) drawn today and your tests are completely normal, you will receive your results only by: MyChart Message (if you have MyChart) OR A paper copy in the mail If you have any lab test that is abnormal or we need to change your treatment, we will call you to review the results.   Testing/Procedures: None ordered   Follow-Up: At [facility], you and your health needs are our priority.  As part of our continuing mission to provide you with exceptional heart care, we have created designated Provider Care Teams.  These Care Teams include your primary Cardiologist (physician) and Advanced Practice Providers (APPs -  Physician Assistants and Nurse Practitioners) who all work together to provide you with the care you need, when you need it.  We recommend signing up for the patient portal called "MyChart".  Sign up information is provided on this After Visit Summary.  MyChart is used to connect with patients for Virtual Visits (Telemedicine).  Patients are able to view lab/test results, encounter notes, upcoming appointments, etc.  Non-urgent messages can be sent to your provider as well.   To learn more about what you can do with MyChart, go to [email or web address].    Your next appointment:    3 month(s)  The format for your next appointment:   In Person  Provider:   Lorine Bears, MD   Other Instructions EXERCISE PROGRAM FOR INDIVIDUALS WITH  PERIPHERAL ARTERIAL DISEASE (PAD)   General Information:   Research in vascular exercise has demonstrated remarkable improvement in symptoms of leg pain (claudication) without expensive or invasive interventions. Regular walking programs are extremely helpful for patients with PAD and intermittent claudication.  These steps are designed to help you get started with a safe and effective program to help you walk farther with less pain:   Walk at least three times a week (preferably every day).  Your goal is to build up to 30-45 minutes of total walking time (not counting rest breaks). It may take you several weeks to build up your exercise time starting at 5-10 minutes or whatever you can tolerate.  Walk as far as possible using moderate to maximal pain (7-8 on the scale below) as a signal to stop, and resume walking when the pain goes away.  On a treadmill, set the speed and grade at a level that brings on the claudication pain within 3 to 5 minutes. Walk at this rate until you experience claudication of moderate severity, rest until the pain improves, and then resume walking.  Over time, you will be able to walk longer at the designated speed and grade; workload should then be increased until you develop the pain within 3 to 5 minutes once again.  This regimen will  induce a significant benefit. Studies have demonstrated that participants may be able to walk up to three or four times farther and have less leg pain, within twelve weeks, by following this protocol.  Pain Scale    0_____1_____2_____3_____4_____5_____6_____7_____8_____9_____10   No Pain                                   Moderate Pain                               Maximal Pain

## 2021-10-24 NOTE — Progress Notes (Signed)
Cardiology Office Note   Date:  10/24/2021   ID:  Meghan Moody, DOB Feb 16, 1946, MRN [id number]  PCP:  [name], MD  Cardiologist:   Lorine Bears, MD   Chief Complaint  Patient presents with   Other    Positive LEA. Meds reviewed verbally with pt.      History of Present Illness: Meghan Moody is a 76 y.o. female who was referred by Dr. Alphonsus Sias for evaluation and management of peripheral arterial disease. She has past medical history of essential hypertension, hyperlipidemia, GERD/hiatal hernia, previous tobacco use and anemia.  She quit smoking in 26-Jul-2006 and was not a heavy smoker. She had normal ABI in 07/26/2015.  She is not diabetic. She recently reported bilateral calf claudication worse on the left side mostly happening when she gets back from her daily walk that require some incline.  This started about 1 year ago with some worsening memory over the last 6 months.  She feels that his symptoms are severe and interfere with her ability to walk which she enjoys.  No rest pain or lower extremity ulceration. She underwent recent noninvasive vascular studies which showed an ABI of 0.58 on the right and 0.60 on the left.  Duplex showed severe right SFA stenosis and occluded left SFA from the proximal to the distal segment with reconstitution in the proximal popliteal artery.  She denies chest pain or shortness of breath.  Her mother was one of my patients and had coronary artery disease.  She died in 07/26/2018.  Past Medical History:  Diagnosis Date   Anemia    Chronic bronchitis (HCC)    Chronic cough    Deafness in left ear    GERD (gastroesophageal reflux disease)    H/O hemorrhoidectomy    Hiatal hernia    Hyperlipidemia    Hypertension    SOB (shortness of breath) on exertion     Past Surgical History:  Procedure Laterality Date   BREAST CYST ASPIRATION Bilateral    neg   BREAST EXCISIONAL BIOPSY Right 1978   neg   BUNIONECTOMY     CATARACT EXTRACTION W/PHACO  Right 03/30/2019   Procedure: CATARACT EXTRACTION PHACO AND INTRAOCULAR LENS PLACEMENT (IOC) RIGHT, 1.23, 00:20.8;  Surgeon: Nevada Crane, MD;  Location: [facility];  Service: Ophthalmology;  Laterality: Right;   CATARACT EXTRACTION W/PHACO Left 04/27/2019   Procedure: CATARACT EXTRACTION PHACO AND INTRAOCULAR LENS PLACEMENT (IOC) LEFT 1.46  00:20.3;  Surgeon: Nevada Crane, MD;  Location: [facility];  Service: Ophthalmology;  Laterality: Left;   COLONOSCOPY WITH PROPOFOL N/A 11/02/2019   Procedure: COLONOSCOPY WITH PROPOFOL;  Surgeon: Toney Reil, MD;  Location: [facility] ENDOSCOPY;  Service: Gastroenterology;  Laterality: N/A;   COLONOSCOPY WITH PROPOFOL N/A 08/01/2021   Procedure: COLONOSCOPY WITH PROPOFOL;  Surgeon: Toney Reil, MD;  Location: [facility] ENDOSCOPY;  Service: Gastroenterology;  Laterality: N/A;   ESOPHAGOGASTRODUODENOSCOPY (EGD) WITH PROPOFOL N/A 11/02/2019   Procedure: ESOPHAGOGASTRODUODENOSCOPY (EGD) WITH PROPOFOL;  Surgeon: Toney Reil, MD;  Location: [facility] ENDOSCOPY;  Service: Gastroenterology;  Laterality: N/A;   ESOPHAGOGASTRODUODENOSCOPY (EGD) WITH PROPOFOL N/A 08/01/2021   Procedure: ESOPHAGOGASTRODUODENOSCOPY (EGD) WITH PROPOFOL;  Surgeon: Toney Reil, MD;  Location: [facility] ENDOSCOPY;  Service: Gastroenterology;  Laterality: N/A;  with Capsule placement   GIVENS CAPSULE STUDY N/A 08/01/2021   Procedure: GIVENS CAPSULE STUDY;  Surgeon: Toney Reil, MD;  Location: [facility] ENDOSCOPY;  Service: Gastroenterology;  Laterality: N/A;   TUBAL LIGATION  Current Outpatient Medications  Medication Sig Dispense Refill   acetaminophen (TYLENOL) 500 MG tablet Take 500 mg by mouth every 8 (eight) hours as needed.     aspirin EC 81 MG tablet Take 1 tablet (81 mg total) by mouth daily. Swallow whole.     atorvastatin (LIPITOR) 40 MG tablet Take 1 tablet (40 mg total) by mouth daily. 30 tablet 5   budesonide (ENTOCORT EC) 3 MG 24 hr  capsule Take 3 capsules (9 mg total) by mouth daily. 90 capsule 2   CEQUA 0.09 % SOLN      cilostazol (PLETAL) 50 MG tablet Take 1 tablet (50 mg total) by mouth 2 (two) times daily. 60 tablet 5   cyclobenzaprine (FLEXERIL) 5 MG tablet Take 5 mg by mouth 3 (three) times daily as needed for muscle spasms.     Lifitegrast (XIIDRA) 5 % SOLN Apply 1 drop to eye 2 (two) times daily. 1 drop each eye daily     Multiple Vitamin (MULTIVITAMIN) tablet Take 1 tablet by mouth daily.     omeprazole (PRILOSEC) 40 MG capsule TAKE 1 CAPSULE BY MOUTH EVERY DAY 90 capsule 3   OVER THE COUNTER MEDICATION Toe Fungus Medication     RESTASIS 0.05 % ophthalmic emulsion 1 drop 2 (two) times daily.     triamterene-hydrochlorothiazide (MAXZIDE-25) 37.5-25 MG tablet TAKE 1/2 TABLET BY MOUTH EVERY DAY 45 tablet 3   No current facility-administered medications for this visit.    Allergies:   Patient has no known allergies.    Social History:  The patient  reports that she quit smoking about 15 years ago. Her smoking use included cigarettes. She has a 12.00 pack-year smoking history. She has been exposed to tobacco smoke. She has never used smokeless tobacco. She reports that she does not drink alcohol and does not use drugs.   Family History:  The patient's family history includes Coronary artery disease in her mother; Drug abuse in her brother; Heart disease in her mother; Hypertension in her mother; Stroke in her father.    ROS:  Please see the history of present illness.   Otherwise, review of systems are positive for none.   All other systems are reviewed and negative.    PHYSICAL EXAM: VS:  BP 138/72 (BP Location: Right Arm, Patient Position: Sitting, Cuff Size: Normal)   Pulse 66   Ht 5\' 5"  (1.651 m)   Wt 131 lb 4 oz (59.5 kg)   SpO2 97%   BMI 21.84 kg/m  , BMI Body mass index is 21.84 kg/m. GEN: Well nourished, well developed, in no acute distress  HEENT: normal  Neck: no JVD, carotid bruits, or  masses Cardiac: RRR; no murmurs, rubs, or gallops,no edema  Respiratory:  clear to auscultation bilaterally, normal work of breathing GI: soft, nontender, nondistended, + BS MS: no deformity or atrophy  Skin: warm and dry, no rash Neuro:  Strength and sensation are intact Psych: euthymic mood, full affect Vascular: Femoral pulses are normal bilaterally.  Distal pulses are not palpable.   EKG:  EKG is ordered today. The ekg ordered today demonstrates normal sinus rhythm with no significant ST or T wave changes.   Recent Labs: 08/08/2021: Hemoglobin 14.2; Platelets 270 08/23/2021: ALT 18; BUN 26; Creatinine, Ser 1.06; Potassium 3.7; Sodium 139    Lipid Panel    Component Value Date/Time   CHOL 185 08/07/2019 1029   TRIG 104.0 08/07/2019 1029   HDL 51.40 08/07/2019 1029   CHOLHDL 4 08/07/2019  1029   VLDL 20.8 08/07/2019 1029   LDLCALC 113 (H) 08/07/2019 1029   LDLDIRECT 163.1 11/10/2012 1249      Wt Readings from Last 3 Encounters:  10/24/21 131 lb 4 oz (59.5 kg)  08/29/21 132 lb (59.9 kg)  08/23/21 134 lb (60.8 kg)          10/24/2021    4:42 PM  PAD Screen  Previous PAD dx? No  Previous surgical procedure? No  Pain with walking? Yes  Subsides with rest? Yes  Feet/toe relief with dangling? No  Painful, non-healing ulcers? No  Extremities discolored? No      ASSESSMENT AND PLAN:  1.  Peripheral arterial disease: The patient has severe bilateral calf claudication worse on the left side due to SFA disease.  She has longer occlusion on the left side with reconstitution via collaterals from the profunda in the proximal popliteal artery.  I discussed with her the natural history and management of claudication.  This is not a limb threatening situation as she does not have evidence of critical limb ischemia.  I elected to start aspirin 81 mg once daily and also cilostazol 50 mg twice daily for symptomatic relief. I provided her with a walking exercise program  instructions. Reevaluate symptoms in 3 months.  If there is no improvement, angiography can be considered.  2.  Hyperlipidemia: Given her recent diagnosis of peripheral arterial disease, I recommend treatment with a statin with a target LDL of less than 70.  I added atorvastatin 40 mg once daily.  Check lipid and liver profile in 6 weeks.  3.  Essential hypertension: Blood pressure is controlled.      Disposition:   FU with me in 3 months  Signed,  Lorine Bears, MD  10/24/2021 5:26 PM    [facility]

## 2021-10-30 DIAGNOSIS — K219 Gastro-esophageal reflux disease without esophagitis: Secondary | ICD-10-CM | POA: Insufficient documentation

## 2021-11-08 ENCOUNTER — Inpatient Hospital Stay: Payer: Medicare PPO | Attending: Oncology

## 2021-11-08 DIAGNOSIS — K449 Diaphragmatic hernia without obstruction or gangrene: Secondary | ICD-10-CM | POA: Insufficient documentation

## 2021-11-08 DIAGNOSIS — Z87891 Personal history of nicotine dependence: Secondary | ICD-10-CM | POA: Diagnosis not present

## 2021-11-08 DIAGNOSIS — Z7952 Long term (current) use of systemic steroids: Secondary | ICD-10-CM | POA: Diagnosis not present

## 2021-11-08 DIAGNOSIS — Z79899 Other long term (current) drug therapy: Secondary | ICD-10-CM | POA: Insufficient documentation

## 2021-11-08 DIAGNOSIS — D509 Iron deficiency anemia, unspecified: Secondary | ICD-10-CM | POA: Diagnosis not present

## 2021-11-08 DIAGNOSIS — Z7902 Long term (current) use of antithrombotics/antiplatelets: Secondary | ICD-10-CM | POA: Diagnosis not present

## 2021-11-08 DIAGNOSIS — I10 Essential (primary) hypertension: Secondary | ICD-10-CM | POA: Insufficient documentation

## 2021-11-08 DIAGNOSIS — Z7982 Long term (current) use of aspirin: Secondary | ICD-10-CM | POA: Insufficient documentation

## 2021-11-08 LAB — CBC WITH DIFFERENTIAL/PLATELET
Abs Immature Granulocytes: 0.02 10*3/uL (ref 0.00–0.07)
Basophils Absolute: 0.1 10*3/uL (ref 0.0–0.1)
Basophils Relative: 1 %
Eosinophils Absolute: 0.1 10*3/uL (ref 0.0–0.5)
Eosinophils Relative: 1 %
HCT: 46.6 % — ABNORMAL HIGH (ref 36.0–46.0)
Hemoglobin: 15.5 g/dL — ABNORMAL HIGH (ref 12.0–15.0)
Immature Granulocytes: 0 %
Lymphocytes Relative: 15 %
Lymphs Abs: 1.4 10*3/uL (ref 0.7–4.0)
MCH: 31.3 pg (ref 26.0–34.0)
MCHC: 33.3 g/dL (ref 30.0–36.0)
MCV: 94 fL (ref 80.0–100.0)
Monocytes Absolute: 0.6 10*3/uL (ref 0.1–1.0)
Monocytes Relative: 6 %
Neutro Abs: 7.3 10*3/uL (ref 1.7–7.7)
Neutrophils Relative %: 77 %
Platelets: 261 10*3/uL (ref 150–400)
RBC: 4.96 MIL/uL (ref 3.87–5.11)
RDW: 15.3 % (ref 11.5–15.5)
WBC: 9.4 10*3/uL (ref 4.0–10.5)
nRBC: 0 % (ref 0.0–0.2)

## 2021-11-08 LAB — IRON AND TIBC
Iron: 100 ug/dL (ref 28–170)
Saturation Ratios: 28 % (ref 10.4–31.8)
TIBC: 357 ug/dL (ref 250–450)
UIBC: 257 ug/dL

## 2021-11-08 LAB — FERRITIN: Ferritin: 71 ng/mL (ref 11–307)

## 2021-11-08 MED FILL — Iron Sucrose Inj 20 MG/ML (Fe Equiv): INTRAVENOUS | Qty: 10 | Status: AC

## 2021-11-09 ENCOUNTER — Encounter: Payer: Self-pay | Admitting: Oncology

## 2021-11-09 ENCOUNTER — Inpatient Hospital Stay: Payer: Medicare PPO

## 2021-11-09 ENCOUNTER — Inpatient Hospital Stay: Payer: Medicare PPO | Admitting: Oncology

## 2021-11-09 VITALS — BP 138/80 | HR 74 | Temp 98.3°F | Resp 18 | Wt 133.3 lb

## 2021-11-09 DIAGNOSIS — D509 Iron deficiency anemia, unspecified: Secondary | ICD-10-CM

## 2021-11-09 NOTE — Progress Notes (Signed)
[place]  [phone] [phone]  ID: ZYRIANNA KOHN OB: [birth year]  MR#: [id number]  [id number]  Patient Care Team: Venia Carbon, MD as PCP - General Venia Carbon, MD as Referring Physician (Pediatrics) Lloyd Huger, MD as Consulting Physician (Oncology) Michael Boston, MD as Consulting Physician (General Surgery) Lin Landsman, MD as Consulting Physician (Gastroenterology) Wellington Hampshire, MD as Consulting Physician (Cardiology)  CHIEF COMPLAINT: Iron deficiency anemia.  INTERVAL HISTORY: Patient returns to clinic today for repeat laboratory work, further evaluation, consideration of additional IV Venofer.  She continues to have chronic weakness and fatigue, but otherwise feels well.  She has no neurologic complaints.  She denies any recent fevers or illnesses.  She has a good appetite and denies weight loss.  She denies any chest pain, shortness of breath, cough, or hemoptysis.  She denies any nausea, vomiting, constipation, or diarrhea.  She has no melena or hematochezia.  She has no urinary complaints.  Patient offers no further specific complaints today.    REVIEW OF SYSTEMS:   Review of Systems  Constitutional:  Positive for malaise/fatigue. Negative for fever and weight loss.  Respiratory: Negative.  Negative for cough, hemoptysis and shortness of breath.   Cardiovascular: Negative.  Negative for chest pain and leg swelling.  Gastrointestinal: Negative.  Negative for abdominal pain, blood in stool and melena.  Genitourinary: Negative.  Negative for hematuria.  Musculoskeletal: Negative.  Negative for back pain.  Skin: Negative.  Negative for rash.  Neurological: Negative.  Negative for dizziness, focal weakness, weakness and headaches.  Psychiatric/Behavioral: Negative.  The patient is not nervous/anxious.     As per HPI. Otherwise, a complete review of systems is negative.  PAST MEDICAL HISTORY: Past  Medical History:  Diagnosis Date   Anemia    Chronic bronchitis (HCC)    Chronic cough    Deafness in left ear    GERD (gastroesophageal reflux disease)    H/O hemorrhoidectomy    Hiatal hernia    Hyperlipidemia    Hypertension    SOB (shortness of breath) on exertion     PAST SURGICAL HISTORY: Past Surgical History:  Procedure Laterality Date   BREAST CYST ASPIRATION Bilateral    neg   BREAST EXCISIONAL BIOPSY Right 1978   neg   BUNIONECTOMY     CATARACT EXTRACTION W/PHACO Right 03/30/2019   Procedure: CATARACT EXTRACTION PHACO AND INTRAOCULAR LENS PLACEMENT (IOC) RIGHT, 1.23, 00:20.8;  Surgeon: Eulogio Bear, MD;  Location: [facility];  Service: Ophthalmology;  Laterality: Right;   CATARACT EXTRACTION W/PHACO Left 04/27/2019   Procedure: CATARACT EXTRACTION PHACO AND INTRAOCULAR LENS PLACEMENT (IOC) LEFT 1.46  00:20.3;  Surgeon: Eulogio Bear, MD;  Location: [facility];  Service: Ophthalmology;  Laterality: Left;   COLONOSCOPY WITH PROPOFOL N/A 11/02/2019   Procedure: COLONOSCOPY WITH PROPOFOL;  Surgeon: Lin Landsman, MD;  Location: [facility] ENDOSCOPY;  Service: Gastroenterology;  Laterality: N/A;   COLONOSCOPY WITH PROPOFOL N/A 08/01/2021   Procedure: COLONOSCOPY WITH PROPOFOL;  Surgeon: Lin Landsman, MD;  Location: [facility] ENDOSCOPY;  Service: Gastroenterology;  Laterality: N/A;   ESOPHAGOGASTRODUODENOSCOPY (EGD) WITH PROPOFOL N/A 11/02/2019   Procedure: ESOPHAGOGASTRODUODENOSCOPY (EGD) WITH PROPOFOL;  Surgeon: Lin Landsman, MD;  Location: [facility] ENDOSCOPY;  Service: Gastroenterology;  Laterality: N/A;   ESOPHAGOGASTRODUODENOSCOPY (EGD) WITH PROPOFOL N/A 08/01/2021   Procedure: ESOPHAGOGASTRODUODENOSCOPY (EGD) WITH PROPOFOL;  Surgeon: Lin Landsman, MD;  Location: [facility] ENDOSCOPY;  Service: Gastroenterology;  Laterality: N/A;  with Capsule  placement   GIVENS CAPSULE STUDY N/A 08/01/2021   Procedure: GIVENS CAPSULE STUDY;  Surgeon: Toney Reil, MD;  Location: [facility] ENDOSCOPY;  Service: Gastroenterology;  Laterality: N/A;   TUBAL LIGATION      FAMILY HISTORY: Family History  Problem Relation Age of Onset   Coronary artery disease Mother    Heart disease Mother    Hypertension Mother    Stroke Father    Drug abuse Brother     ADVANCED DIRECTIVES (Y/N):  N  HEALTH MAINTENANCE: Social History   Tobacco Use   Smoking status: Former    Packs/day: 0.30    Years: 40.00    Total pack years: 12.00    Types: Cigarettes    Quit date: 04/16/2006    Years since quitting: 15.5    Passive exposure: Current   Smokeless tobacco: Never  Vaping Use   Vaping Use: Never used  Substance Use Topics   Alcohol use: No   Drug use: No     Colonoscopy:  PAP:  Bone density:  Lipid panel:  No Known Allergies  Current Outpatient Medications  Medication Sig Dispense Refill   acetaminophen (TYLENOL) 500 MG tablet Take 500 mg by mouth every 8 (eight) hours as needed.     aspirin EC 81 MG tablet Take 1 tablet (81 mg total) by mouth daily. Swallow whole.     atorvastatin (LIPITOR) 40 MG tablet Take 1 tablet (40 mg total) by mouth daily. 30 tablet 5   budesonide (ENTOCORT EC) 3 MG 24 hr capsule Take 3 capsules (9 mg total) by mouth daily. 90 capsule 2   CEQUA 0.09 % SOLN      cilostazol (PLETAL) 50 MG tablet Take 1 tablet (50 mg total) by mouth 2 (two) times daily. 60 tablet 5   cyclobenzaprine (FLEXERIL) 5 MG tablet Take 5 mg by mouth 3 (three) times daily as needed for muscle spasms.     Lifitegrast (XIIDRA) 5 % SOLN Apply 1 drop to eye 2 (two) times daily. 1 drop each eye daily     Multiple Vitamin (MULTIVITAMIN) tablet Take 1 tablet by mouth daily.     omeprazole (PRILOSEC) 40 MG capsule TAKE 1 CAPSULE BY MOUTH EVERY DAY 90 capsule 3   OVER THE COUNTER MEDICATION Toe Fungus Medication     RESTASIS 0.05 % ophthalmic emulsion 1 drop 2 (two) times daily.     triamterene-hydrochlorothiazide (MAXZIDE-25) 37.5-25 MG tablet TAKE  1/2 TABLET BY MOUTH EVERY DAY 45 tablet 3   No current facility-administered medications for this visit.    OBJECTIVE: Vitals:   11/09/21 1407  BP: 138/80  Pulse: 74  Resp: 18  Temp: 98.3 F (36.8 C)     Body mass index is 22.18 kg/m.    ECOG FS:0 - Asymptomatic  General: Well-developed, well-nourished, no acute distress. Eyes: Pink conjunctiva, anicteric sclera. HEENT: Normocephalic, moist mucous membranes. Lungs: No audible wheezing or coughing. Heart: Regular rate and rhythm. Abdomen: Soft, nontender, no obvious distention. Musculoskeletal: No edema, cyanosis, or clubbing. Neuro: Alert, answering all questions appropriately. Cranial nerves grossly intact. Skin: No rashes or petechiae noted. Psych: Normal affect.   LAB RESULTS:  Lab Results  Component Value Date   NA 139 08/23/2021   K 3.7 08/23/2021   CL 103 08/23/2021   CO2 28 08/23/2021   GLUCOSE 86 08/23/2021   BUN 26 (H) 08/23/2021   CREATININE 1.06 08/23/2021   CALCIUM 9.2 08/23/2021   PROT 6.9 08/23/2021   ALBUMIN 4.3 08/23/2021  ALBUMIN 4.3 08/23/2021   AST 22 08/23/2021   ALT 18 08/23/2021   ALKPHOS 98 08/23/2021   BILITOT 0.3 08/23/2021   GFRNONAA 42 (L) 08/29/2015   GFRAA 49 (L) 08/29/2015    Lab Results  Component Value Date   WBC 9.4 11/08/2021   NEUTROABS 7.3 11/08/2021   HGB 15.5 (H) 11/08/2021   HCT 46.6 (H) 11/08/2021   MCV 94.0 11/08/2021   PLT 261 11/08/2021   Lab Results  Component Value Date   IRON 100 11/08/2021   TIBC 357 11/08/2021   IRONPCTSAT 28 11/08/2021   Lab Results  Component Value Date   FERRITIN 71 11/08/2021     STUDIES: VAS [place] LOWER EXTREMITY ARTERIAL DUPLEX  Result Date: 10/13/2021 LOWER EXTREMITY ARTERIAL DUPLEX STUDY Patient Name:  ASTI MACKLEY  Date of Exam:   10/13/2021 Medical Rec #: [id number]      Accession #:    [id number] Date of Birth: 11/17/1945      Patient Gender: F Patient Age:   76 years Exam Location:  [place] Procedure:      VAS [place]  LOWER EXTREMITY ARTERIAL DUPLEX Referring Phys: Tillman Abide --------------------------------------------------------------------------------  Indications: Claudication, and Bilateral calf fatigue after 100-200 yd walk.              Resolves with rest. Present for 6 mos. diminished left pedal              pulses. High Risk Factors: Hypertension, past history of smoking. Other Factors: ABI performed on 10/11/21.  Current ABI: Right 0.58, left 0.60 Comparison Study: None Performing Technologist: Quentin Ore RDMS, RVT, RDCS  Examination Guidelines: A complete evaluation includes B-mode imaging, spectral Doppler, color Doppler, and power Doppler as needed of all accessible portions of each vessel. Bilateral testing is considered an integral part of a complete examination. Limited examinations for reoccurring indications may be performed as noted.  +----------+--------+-----+---------------+----------+--------+ RIGHT     PSV cm/sRatioStenosis       Waveform  Comments +----------+--------+-----+---------------+----------+--------+ CFA Prox  76                          triphasic          +----------+--------+-----+---------------+----------+--------+ CFA Distal70                          biphasic           +----------+--------+-----+---------------+----------+--------+ DFA       129                         triphasic          +----------+--------+-----+---------------+----------+--------+ SFA Prox  73                          monophasic         +----------+--------+-----+---------------+----------+--------+ SFA Mid   86                          monophasic         +----------+--------+-----+---------------+----------+--------+ SFA Distal431          75-99% stenosis                   +----------+--------+-----+---------------+----------+--------+ POP Prox  90  monophasic         +----------+--------+-----+---------------+----------+--------+ POP  Mid   67                          monophasic         +----------+--------+-----+---------------+----------+--------+ POP Distal45                          monophasic         +----------+--------+-----+---------------+----------+--------+ TP Trunk  49                          monophasic         +----------+--------+-----+---------------+----------+--------+ ATA Mid   33                          monophasic         +----------+--------+-----+---------------+----------+--------+ PTA Mid   19                          monophasic         +----------+--------+-----+---------------+----------+--------+ PERO Mid  41                          monophasic         +----------+--------+-----+---------------+----------+--------+ A focal velocity elevation of 431 cm/s was obtained at distal SFA with a VR of 5.0. Findings are characteristic of 75-99% stenosis.  +----------+--------+-----+---------------+----------+--------+ LEFT      PSV cm/sRatioStenosis       Waveform  Comments +----------+--------+-----+---------------+----------+--------+ CFA Prox  77                          triphasic          +----------+--------+-----+---------------+----------+--------+ CFA Distal83                          biphasic           +----------+--------+-----+---------------+----------+--------+ DFA       133                         triphasic          +----------+--------+-----+---------------+----------+--------+ SFA Prox               occluded                          +----------+--------+-----+---------------+----------+--------+ SFA Mid                occluded                          +----------+--------+-----+---------------+----------+--------+ SFA Distal             occluded                          +----------+--------+-----+---------------+----------+--------+ POP Prox  364          50-74% stenosismonophasic          +----------+--------+-----+---------------+----------+--------+ POP Mid   39                          monophasic         +----------+--------+-----+---------------+----------+--------+  POP Distal58                          monophasic         +----------+--------+-----+---------------+----------+--------+ TP Trunk  52                          monophasic         +----------+--------+-----+---------------+----------+--------+ ATA Mid   35                          monophasic         +----------+--------+-----+---------------+----------+--------+ PTA Mid   38                          monophasic         +----------+--------+-----+---------------+----------+--------+ PERO Mid               occluded                          +----------+--------+-----+---------------+----------+--------+  Summary: Right: 75-99% stenosis noted in the superficial femoral artery. Left: 50-74% stenosis noted in the deep femoral artery. Total occlusion noted in the superficial femoral artery. Total occlusion noted in the superficial femoral artery and/or popliteal artery. Total occlusion noted in the peroneal artery.  See table(s) above for measurements and observations. Vascular consult recommended. Electronically signed by Quay Burow MD on 10/13/2021 at 5:50:18 PM.    Final    VAS [place] LOWER EXT ART SEG MULTI (SEGMENTALS & LE RAYNAUDS)  Result Date: 10/11/2021  LOWER EXTREMITY DOPPLER STUDY Patient Name:  ANETH LOEFFLER  Date of Exam:   10/11/2021 Medical Rec #: [id number]      Accession #:    [id number] Date of Birth: 1945-07-30      Patient Gender: F Patient Age:   40 years Exam Location:  [place] Procedure:      VAS [place] LOWER EXT ART SEG MULTI (SEGMENTALS & LE RAYNAUDS) Referring Phys: Viviana Simpler --------------------------------------------------------------------------------  Indications: Claudication, and Marked fatigue in bilateral calves after walking              100-200 yds. resolves  with rest. R=L. Symptoms are progressive and              have been present for about 6 months. High Risk Factors: Hypertension, hyperlipidemia, past history of smoking. Other Factors: Diminished pedal pulse, left foot.  Comparison Study: 11/1`4/17 ABI was normal Performing Technologist: Pilar Jarvis RVT  Examination Guidelines: A complete evaluation includes at minimum, Doppler waveform signals and systolic blood pressure reading at the level of bilateral brachial, anterior tibial, and posterior tibial arteries, when vessel segments are accessible. Bilateral testing is considered an integral part of a complete examination. Photoelectric Plethysmograph (PPG) waveforms and toe systolic pressure readings are included as required and additional duplex testing as needed. Limited examinations for reoccurring indications may be performed as noted.  ABI Findings: +---------+------------------+-----+----------+--------+ Right    Rt Pressure (mmHg)IndexWaveform  Comment  +---------+------------------+-----+----------+--------+ Brachial 146                    triphasic          +---------+------------------+-----+----------+--------+ CFA                             triphasic          +---------+------------------+-----+----------+--------+  Popliteal                       monophasic         +---------+------------------+-----+----------+--------+ ATA                             monophasic         +---------+------------------+-----+----------+--------+ PTA      82                0.55 monophasic         +---------+------------------+-----+----------+--------+ PERO     85                0.57 monophasic         +---------+------------------+-----+----------+--------+ DP       87                0.58 monophasic         +---------+------------------+-----+----------+--------+ Great Toe41                0.27 Normal              +---------+------------------+-----+----------+--------+ +---------+------------------+-----+----------+-------+ Left     Lt Pressure (mmHg)IndexWaveform  Comment +---------+------------------+-----+----------+-------+ Brachial 150                    triphasic         +---------+------------------+-----+----------+-------+ CFA                             triphasic         +---------+------------------+-----+----------+-------+ Popliteal                       monophasic        +---------+------------------+-----+----------+-------+ PTA      85                0.57 monophasic        +---------+------------------+-----+----------+-------+ PERO     88                0.60 monophasic        +---------+------------------+-----+----------+-------+ DP       88                0.59 monophasic        +---------+------------------+-----+----------+-------+ Great Toe68                0.45 Normal            +---------+------------------+-----+----------+-------+ +-------+-----------+-----------+------------+------------+ ABI/TBIToday's ABIToday's TBIPrevious ABIPrevious TBI +-------+-----------+-----------+------------+------------+ Right  0.58       0.27       1.32                     +-------+-----------+-----------+------------+------------+ Left   0.6        0.45       1.21                     +-------+-----------+-----------+------------+------------+ Bilateral ABIs appear decreased compared to prior study on 02/28/16. Dr Silvio Pate messaged with preliminary results and a request to order and schedule a follow-up duplex exam  Summary: Right: Resting right ankle-brachial index indicates moderate right lower extremity arterial disease. The right toe-brachial index is abnormal. Left: Resting left ankle-brachial index indicates moderate left lower extremity arterial disease. The left toe-brachial index is abnormal. *See table(s) above for measurements  and observations.   Electronically signed by Nanetta Batty MD on 10/11/2021 at 3:43:33 PM.    Final     ASSESSMENT: Iron deficiency anemia.  PLAN:    1.  Iron deficiency anemia: Patient's hemoglobin and iron stores continue to be within normal limits.  Patient underwent EGD, video endoscopy, and colonoscopy on August 01, 2021 with no evidence of active bleeding and no significant pathology reported.  She was noted to have a large hiatal hernia in which GI recommended surgical evaluation.  Patient reports she is hesitant to do this given the fact that her hemoglobin is normal and she is no longer bleeding.  She does not require additional IV Venofer today.  She last received treatment on Sep 01, 2021.  Return to clinic in 6 months with repeat laboratory work, further evaluation, and continuation of treatment if needed.   2.  Hypertension: Patient's blood pressure is within normal limits today.  Continue monitoring and treatment per primary care. 3.  Hiatal hernia: Patient has been referred to surgery, but is hesitant to undergo any procedures at this time.   Patient expressed understanding and was in agreement with this plan. She also understands that She can call clinic at any time with any questions, concerns, or complaints.     Jeralyn Ruths, MD   11/10/2021 8:05 AM

## 2021-11-09 NOTE — Progress Notes (Signed)
Patient here today for follow up regarding anemia.  

## 2021-11-10 ENCOUNTER — Encounter: Payer: Self-pay | Admitting: Oncology

## 2021-11-22 ENCOUNTER — Other Ambulatory Visit: Payer: Self-pay | Admitting: Internal Medicine

## 2021-11-22 DIAGNOSIS — I739 Peripheral vascular disease, unspecified: Secondary | ICD-10-CM

## 2021-11-26 ENCOUNTER — Other Ambulatory Visit: Payer: Self-pay | Admitting: Gastroenterology

## 2021-11-26 DIAGNOSIS — K529 Noninfective gastroenteritis and colitis, unspecified: Secondary | ICD-10-CM

## 2021-11-29 ENCOUNTER — Other Ambulatory Visit
Admission: RE | Admit: 2021-11-29 | Discharge: 2021-11-29 | Disposition: A | Discharge status: Home / Self Care | Payer: Medicare PPO | Attending: Cardiovascular Disease | Admitting: Cardiovascular Disease

## 2021-11-29 DIAGNOSIS — E785 Hyperlipidemia, unspecified: Secondary | ICD-10-CM | POA: Diagnosis not present

## 2021-11-29 DIAGNOSIS — I739 Peripheral vascular disease, unspecified: Secondary | ICD-10-CM | POA: Diagnosis not present

## 2021-11-29 LAB — HEPATIC FUNCTION PANEL
ALT: 26 U/L (ref 0–44)
AST: 25 U/L (ref 15–41)
Albumin: 4 g/dL (ref 3.5–5.0)
Alkaline Phosphatase: 69 U/L (ref 38–126)
Bilirubin, Direct: 0.1 mg/dL (ref 0.0–0.2)
Indirect Bilirubin: 1 mg/dL — ABNORMAL HIGH (ref 0.3–0.9)
Total Bilirubin: 1.1 mg/dL (ref 0.3–1.2)
Total Protein: 6.6 g/dL (ref 6.5–8.1)

## 2021-11-29 LAB — LIPID PANEL
Cholesterol: 170 mg/dL (ref 0–200)
HDL: 67 mg/dL (ref >40)
LDL Cholesterol: 89 mg/dL (ref 0–99)
Total CHOL/HDL Ratio: 2.5 RATIO
Triglycerides: 71 mg/dL (ref <150)
VLDL: 14 mg/dL (ref 0–40)

## 2021-12-19 ENCOUNTER — Encounter (INDEPENDENT_AMBULATORY_CARE_PROVIDER_SITE_OTHER): Payer: Self-pay | Admitting: Vascular Surgery

## 2021-12-19 ENCOUNTER — Encounter (INDEPENDENT_AMBULATORY_CARE_PROVIDER_SITE_OTHER): Payer: Self-pay

## 2021-12-21 ENCOUNTER — Encounter: Payer: Self-pay | Admitting: Gastroenterology

## 2021-12-21 ENCOUNTER — Ambulatory Visit: Payer: Medicare PPO | Admitting: Gastroenterology

## 2021-12-21 VITALS — BP 125/65 | HR 73 | Temp 97.9°F | Ht 65.0 in | Wt 136.1 lb

## 2021-12-21 DIAGNOSIS — D500 Iron deficiency anemia secondary to blood loss (chronic): Secondary | ICD-10-CM | POA: Diagnosis not present

## 2021-12-21 DIAGNOSIS — K529 Noninfective gastroenteritis and colitis, unspecified: Secondary | ICD-10-CM | POA: Diagnosis not present

## 2021-12-21 DIAGNOSIS — K449 Diaphragmatic hernia without obstruction or gangrene: Secondary | ICD-10-CM

## 2021-12-21 NOTE — Progress Notes (Signed)
Meghan Repress, MD [address]  [address]  [place], [place] [address]  Main: [phone]  Fax: [phone]    Gastroenterology Consultation  Referring Provider:     [name], MD Primary Care Physician:  Meghan Schwalbe, MD Primary Gastroenterologist:  Dr. Arlyss Moody Reason for Consultation:     Recurrent iron deficiency anemia        HPI:   Meghan Moody is a 76 y.o. female referred by Dr. [name], MD  for consultation & management of recurrent iron deficiency anemia and history of chronic GERD  Chronic iron deficiency anemia: Patient has history of iron deficiency anemia since 08/2015, she was on oral iron in the past.  Most recently on 08/07/2019, patient to follow-up with her PCP, Dr. Orlean Moody, she was complaining of exertional dyspnea, chest pain, underwent work-up, found to have hemoglobin of 6.5, MCV 59.9, ferritin 1.8.  Patient reports that she has been symptomatic for few months, she was busy taking care of her mom who recently died.  She is then referred to Meghan Moody on 4/27, who started her on IV iron.  She also received blood transfusion, her hemoglobin responded to 8.5 Since then, she reports feeling significantly better  Patient denies any black stools, rectal bleeding, abdominal pain, weight loss.  She reports that she had noticed black stools several months ago  Chronic GERD: She has been on omeprazole 20 mg daily, recently increased to 40 mg daily due to heartburn.  She denies difficulty swallowing, epigastric pain, nausea or vomiting  Follow-up visit 07/10/2021 Patient is here for follow-up of recurrence of iron deficiency anemia at the request of her hematologist.  Patient was evaluated by me in 2021 for iron deficiency anemia, underwent upper endoscopy which revealed 7 cm hiatal hernia.  No evidence of celiac disease, H. pylori.  I started her on long-term PPI.  She also underwent colonoscopy which was unremarkable.  She did not undergo  video capsule endoscopy.  Her iron deficiency anemia has resolved since then with iron replacement therapy until recently when her ferritin level started declining again, along with her hemoglobin.  Most recently her ferritin was 7 and hemoglobin 12.3 in 1/23.  Patient received 5 iron infusions so far.  She has an appointment to see hematology next month.  Patient is taking multivitamin with iron in it and her stools are reported to be dark but not black.  Patient denies any symptoms of anemia such as shortness of breath, fatigue.  She continues to take omeprazole 40 mg daily.  Follow-up visit 08/29/2021 Patient is here to discuss about video capsule endoscopy results.  She underwent upper endoscopy and colonoscopy which were unremarkable except for large hiatal hernia.  Subsequently, underwent video capsule endoscopy which revealed several scattered erosions in the distal small bowel.  Subsequently, her fecal calprotectin levels are elevated to 271.  Patient denies any GI symptoms.  She reports that last year after she returned from vacation, she did have diarrhea for 6 weeks which spontaneously resolved.  She currently reports having regular bowel movements.  Her iron deficiency anemia has resolved.  Patient does report that she was told that she had inflammation in her eyes that results in dryness of her eyes, itching, blurring of vision and she was using eyedrops prescribed by her optometrist.  Patient did not have a follow-up with optometrist for more than a year.  Follow-up visit 12/21/2021 Patient is here for follow-up of iron deficiency  anemia and nonspecific acute ileitis.  Given her elevated fecal calprotectin levels and history of iron deficiency anemia, I have started her on budesonide 9 mg daily which she has been taking for last 3 months.  She was seen by the surgeon, Meghan Moody for evaluation of large hernia repair.  Patient wanted to defer surgery at this time as she is just diagnosed with  peripheral artery disease, currently on dual antiplatelet therapy along with atorvastatin.  She saw Meghan Moody on 10/24/2021 prior to her visit with the surgeon.  She also has a family member taking care of her.  If there is no improvement, angiography will be considered.  With regards to her GI symptoms, she denies any currently.  She continues to take omeprazole 40 mg once a day.  Her most recent blood work by Meghan Moody on 11/09/2021 revealed serum ferritin 71 and hemoglobin 15.5.   She does not smoke or drink alcohol  NSAIDs: None  Antiplts/Anticoagulants/Anti thrombotics: None  GI Procedures: Colonoscopy in 2002 EGD and colonoscopy 08/01/2021 - Normal duodenal bulb, second portion of the duodenum and third portion of the duodenum. - 7 cm hiatal hernia. - Erythematous mucosa in the gastric fundus. - Normal gastroesophageal junction and esophagus. - No specimens collected. - Successful completion of the Video Capsule Enteroscope placement.  - Severe diverticulosis in the recto-sigmoid colon and in the sigmoid colon. There was no evidence of diverticular bleeding. - Mild diverticulosis in the descending colon, in the transverse colon and in the ascending colon. There was no evidence of diverticular bleeding. - Non-bleeding external hemorrhoids. - The examination was otherwise normal. - No specimens collected.  Video capsule endoscopy 08/04/2021 Several nonbleeding erosions in the distal small bowel  EGD and colonoscopy 11/02/2019 - Normal duodenal bulb and second portion of the duodenum. Biopsied. - 7 cm hiatal hernia. - Normal stomach. Biopsied. - Normal gastroesophageal junction and esophagus.  - The examined portion of the ileum was normal. - Diverticulosis in the sigmoid colon, in the descending colon and in the transverse colon. - Non-bleeding external hemorrhoids. - The examination was otherwise normal. - No specimens collected.  DIAGNOSIS:  A. DUODENUM; COLD BIOPSY:  -  ENTERIC MUCOSA WITH PRESERVED VILLOUS ARCHITECTURE AND NO SIGNIFICANT  HISTOPATHOLOGIC CHANGE.  - NEGATIVE FOR FEATURES OF CELIAC, DYSPLASIA, AND MALIGNANCY.   B. STOMACH, RANDOM; COLD BIOPSY:  - GASTRIC ANTRAL AND OXYNTIC MUCOSA WITH MILD CHRONIC INACTIVE  GASTRITIS.  - NEGATIVE FOR H. PYLORI, DYSPLASIA, AND MALIGNANCY.  Past Medical History:  Diagnosis Date   Anemia    Chronic bronchitis (HCC)    Chronic cough    Deafness in left ear    GERD (gastroesophageal reflux disease)    H/O hemorrhoidectomy    Hiatal hernia    Hyperlipidemia    Hypertension    SOB (shortness of breath) on exertion     Past Surgical History:  Procedure Laterality Date   BREAST CYST ASPIRATION Bilateral    neg   BREAST EXCISIONAL BIOPSY Right 1978   neg   BUNIONECTOMY     CATARACT EXTRACTION W/PHACO Right 03/30/2019   Procedure: CATARACT EXTRACTION PHACO AND INTRAOCULAR LENS PLACEMENT (IOC) RIGHT, 1.23, 00:20.8;  Surgeon: Nevada Crane, MD;  Location: [facility];  Service: Ophthalmology;  Laterality: Right;   CATARACT EXTRACTION W/PHACO Left 04/27/2019   Procedure: CATARACT EXTRACTION PHACO AND INTRAOCULAR LENS PLACEMENT (IOC) LEFT 1.46  00:20.3;  Surgeon: Nevada Crane, MD;  Location: [facility];  Service: Ophthalmology;  Laterality: Left;   COLONOSCOPY WITH PROPOFOL N/A 11/02/2019   Procedure: COLONOSCOPY WITH PROPOFOL;  Surgeon: Toney Reil, MD;  Location: [facility] ENDOSCOPY;  Service: Gastroenterology;  Laterality: N/A;   COLONOSCOPY WITH PROPOFOL N/A 08/01/2021   Procedure: COLONOSCOPY WITH PROPOFOL;  Surgeon: Toney Reil, MD;  Location: [facility] ENDOSCOPY;  Service: Gastroenterology;  Laterality: N/A;   ESOPHAGOGASTRODUODENOSCOPY (EGD) WITH PROPOFOL N/A 11/02/2019   Procedure: ESOPHAGOGASTRODUODENOSCOPY (EGD) WITH PROPOFOL;  Surgeon: Toney Reil, MD;  Location: [facility] ENDOSCOPY;  Service: Gastroenterology;  Laterality: N/A;   ESOPHAGOGASTRODUODENOSCOPY (EGD)  WITH PROPOFOL N/A 08/01/2021   Procedure: ESOPHAGOGASTRODUODENOSCOPY (EGD) WITH PROPOFOL;  Surgeon: Toney Reil, MD;  Location: [facility] ENDOSCOPY;  Service: Gastroenterology;  Laterality: N/A;  with Capsule placement   GIVENS CAPSULE STUDY N/A 08/01/2021   Procedure: GIVENS CAPSULE STUDY;  Surgeon: Toney Reil, MD;  Location: [facility] ENDOSCOPY;  Service: Gastroenterology;  Laterality: N/A;   TUBAL LIGATION      Current Outpatient Medications:    acetaminophen (TYLENOL) 500 MG tablet, Take 500 mg by mouth every 8 (eight) hours as needed., Disp: , Rfl:    aspirin EC 81 MG tablet, Take 1 tablet (81 mg total) by mouth daily. Swallow whole., Disp: , Rfl:    atorvastatin (LIPITOR) 40 MG tablet, Take 1 tablet (40 mg total) by mouth daily., Disp: 30 tablet, Rfl: 5   budesonide (ENTOCORT EC) 3 MG 24 hr capsule, TAKE 3 CAPSULES (9 MG TOTAL) BY MOUTH DAILY., Disp: 270 capsule, Rfl: 0   CEQUA 0.09 % SOLN, , Disp: , Rfl:    cilostazol (PLETAL) 50 MG tablet, Take 1 tablet (50 mg total) by mouth 2 (two) times daily., Disp: 60 tablet, Rfl: 5   cyclobenzaprine (FLEXERIL) 5 MG tablet, Take 5 mg by mouth 3 (three) times daily as needed for muscle spasms., Disp: , Rfl:    Lifitegrast (XIIDRA) 5 % SOLN, Apply 1 drop to eye 2 (two) times daily. 1 drop each eye daily, Disp: , Rfl:    Multiple Vitamin (MULTIVITAMIN) tablet, Take 1 tablet by mouth daily., Disp: , Rfl:    omeprazole (PRILOSEC) 40 MG capsule, TAKE 1 CAPSULE BY MOUTH EVERY DAY, Disp: 90 capsule, Rfl: 3   OVER THE COUNTER MEDICATION, Toe Fungus Medication, Disp: , Rfl:    RESTASIS 0.05 % ophthalmic emulsion, 1 drop 2 (two) times daily., Disp: , Rfl:    triamterene-hydrochlorothiazide (MAXZIDE-25) 37.5-25 MG tablet, TAKE 1/2 TABLET BY MOUTH EVERY DAY, Disp: 45 tablet, Rfl: 3   Family History  Problem Relation Moody of Onset   Coronary artery disease Mother    Heart disease Mother    Hypertension Mother    Stroke Father    Drug abuse Brother       Social History   Tobacco Use   Smoking status: Former    Packs/day: 0.30    Years: 40.00    Total pack years: 12.00    Types: Cigarettes    Quit date: 04/16/2006    Years since quitting: 15.6    Passive exposure: Current   Smokeless tobacco: Never  Vaping Use   Vaping Use: Never used  Substance Use Topics   Alcohol use: No   Drug use: No    Allergies as of 12/21/2021   (No Known Allergies)    Review of Systems:    All systems reviewed and negative except where noted in HPI.   Physical Exam:  BP 125/65 (BP Location: Left Arm, Patient Position: Sitting, Cuff Size: Normal)  Pulse 73   Temp 97.9 F (36.6 C) (Oral)   Ht 5\' 5"  (1.651 m)   Wt 136 lb 2 oz (61.7 kg)   BMI 22.65 kg/m  No LMP recorded. Patient is postmenopausal.  General:   Alert,  Well-developed, well-nourished, pleasant and cooperative in NAD Head:  Normocephalic and atraumatic. Eyes:  Sclera clear, no icterus.   Conjunctiva pink. Ears:  Normal auditory acuity. Nose:  No deformity, discharge, or lesions. Mouth:  No deformity or lesions,oropharynx pink & moist. Neck:  Supple; no masses or thyromegaly. Lungs:  Respirations even and unlabored.  Clear throughout to auscultation.   No wheezes, crackles, or rhonchi. No acute distress. Heart:  Regular rate and rhythm; no murmurs, clicks, rubs, or gallops. Abdomen:  Normal bowel sounds. Soft, non-tender and non-distended without masses, hepatosplenomegaly or hernias noted.  No guarding or rebound tenderness.   Rectal: Not performed Msk:  Symmetrical without gross deformities. Good, equal movement & strength bilaterally. Pulses:  Normal pulses noted. Extremities:  No clubbing or edema.  No cyanosis. Neurologic:  Alert and oriented x3;  grossly normal neurologically. Skin:  Intact without significant lesions or rashes. No jaundice. Psych:  Alert and cooperative. Normal mood and affect.  Imaging Studies: No abdominal imaging  Assessment and Plan:   MAHKAYLA PREECE is a 76 y.o. female with chronic GERD, large hiatal hernia with acute on chronic iron deficiency anemia  Acute on chronic iron deficiency anemia: Currently resolved Patient responded to iron therapy Repeat EGD revealed 7 cm hiatal hernia, no evidence of ulcers or erosions Repeat colonoscopy revealed hemorrhoids and mild diverticulosis only Video capsule endoscopy revealed several scattered aphthous ulcers in the distal ileum Fecal calprotectin levels are elevated to 271 Patient does not have any GI symptoms, however given video capsule endoscopy findings and elevated fecal calprotectin levels, I have started her on budesonide 9 mg daily for 3 months, recommend to recheck fecal calprotectin levels  Large hiatal hernia Patient was evaluated by Dr. 62 at [facility] surgery.  Risks and benefits of the surgery have been discussed and patient deferred surgery at this time given the new diagnosis of peripheral artery disease and the risk of surgery, as well as resolution of iron deficiency anemia at present Continue omeprazole 40 mg once a day before breakfast indefinitely   Eye symptoms Urged patient to follow-up with [facility] in [place] for slit-lamp exam   Follow up in 6 months  [place], MD

## 2021-12-27 DIAGNOSIS — K529 Noninfective gastroenteritis and colitis, unspecified: Secondary | ICD-10-CM | POA: Diagnosis not present

## 2021-12-30 LAB — CALPROTECTIN, FECAL: Calprotectin, Fecal: 140 ug/g — ABNORMAL HIGH (ref 0–120)

## 2022-01-01 ENCOUNTER — Telehealth: Payer: Self-pay

## 2022-01-01 DIAGNOSIS — K529 Noninfective gastroenteritis and colitis, unspecified: Secondary | ICD-10-CM

## 2022-01-01 NOTE — Telephone Encounter (Signed)
-----   Message from Lin Landsman, MD sent at 01/01/2022 10:41 AM EDT ----- Fecal calprotectin levels are elevated but improving compared to 4 months ago.  I would like to rule out infection, recommend GI profile PCR  RV

## 2022-01-01 NOTE — Telephone Encounter (Signed)
Called patient and left  a message for call back order GI profile Sent mychart message

## 2022-01-12 ENCOUNTER — Other Ambulatory Visit: Payer: Self-pay

## 2022-01-12 ENCOUNTER — Other Ambulatory Visit: Payer: Self-pay | Admitting: Gastroenterology

## 2022-01-12 DIAGNOSIS — K529 Noninfective gastroenteritis and colitis, unspecified: Secondary | ICD-10-CM

## 2022-01-12 LAB — GI PROFILE, STOOL, PCR
Adenovirus F 40/41: NOT DETECTED
Astrovirus: NOT DETECTED
C difficile toxin A/B: DETECTED — AB
Campylobacter: NOT DETECTED
Cryptosporidium: NOT DETECTED
Cyclospora cayetanensis: NOT DETECTED
Entamoeba histolytica: NOT DETECTED
Enteroaggregative E coli: NOT DETECTED
Enteropathogenic E coli: DETECTED — AB
Enterotoxigenic E coli: NOT DETECTED
Giardia lamblia: NOT DETECTED
Norovirus GI/GII: NOT DETECTED
Plesiomonas shigelloides: NOT DETECTED
Rotavirus A: NOT DETECTED
Salmonella: NOT DETECTED
Sapovirus: NOT DETECTED
Shiga-toxin-producing E coli: NOT DETECTED
Shigella/Enteroinvasive E coli: NOT DETECTED
Vibrio cholerae: NOT DETECTED
Vibrio: NOT DETECTED
Yersinia enterocolitica: NOT DETECTED

## 2022-01-12 MED ORDER — AZITHROMYCIN 500 MG PO TABS: 500.0000 mg | ORAL_TABLET | Freq: Every day | ORAL | 0 refills | Status: DC

## 2022-01-12 MED ORDER — VANCOMYCIN HCL 125 MG PO CAPS: 125.0000 mg | ORAL_CAPSULE | Freq: Four times a day (QID) | ORAL | 0 refills | Status: AC

## 2022-01-15 ENCOUNTER — Telehealth: Payer: Self-pay

## 2022-01-15 NOTE — Telephone Encounter (Signed)
PA Case: [id number], Status: Approved, Coverage Starts on: 04/16/2021 12:00:00 AM, Coverage Ends on: 04/16/2023 12:00:00 AM. Questions? Contact [phone].

## 2022-01-15 NOTE — Telephone Encounter (Signed)
Submitted urgent PA on vancomycin to insurance company on cover my meds. Waiting on response from them

## 2022-01-23 ENCOUNTER — Encounter: Payer: Self-pay | Admitting: *Deleted

## 2022-02-12 ENCOUNTER — Encounter (INDEPENDENT_AMBULATORY_CARE_PROVIDER_SITE_OTHER): Payer: Self-pay

## 2022-03-06 ENCOUNTER — Encounter: Payer: Self-pay | Admitting: Gastroenterology

## 2022-03-06 DIAGNOSIS — K529 Noninfective gastroenteritis and colitis, unspecified: Secondary | ICD-10-CM

## 2022-03-06 NOTE — Telephone Encounter (Signed)
Budesonide 3mg  taking 9mg  daily

## 2022-03-12 ENCOUNTER — Encounter: Payer: Self-pay | Admitting: Family Medicine

## 2022-03-12 ENCOUNTER — Telehealth: Payer: Self-pay

## 2022-03-12 ENCOUNTER — Ambulatory Visit: Payer: Medicare PPO | Admitting: Family Medicine

## 2022-03-12 VITALS — BP 115/70 | HR 92 | Temp 99.2°F | Ht 65.0 in | Wt 132.6 lb

## 2022-03-12 DIAGNOSIS — R059 Cough, unspecified: Secondary | ICD-10-CM | POA: Insufficient documentation

## 2022-03-12 MED ORDER — BENZONATATE 200 MG PO CAPS: 200.0000 mg | ORAL_CAPSULE | Freq: Two times a day (BID) | ORAL | 0 refills | Status: DC | PRN

## 2022-03-12 MED ORDER — AZITHROMYCIN 250 MG PO TABS: ORAL_TABLET | ORAL | 0 refills | Status: AC

## 2022-03-12 NOTE — Progress Notes (Signed)
SUBJECTIVE:   Chief Complaint  Patient presents with   Acute Visit    Cough/hoarse/itchy eyes x 1 week   HPI  Cough: Patient complains of eye irritation and productive cough with sputum described as yellow and green.  Symptoms began 1 week ago.  The cough is non-productive, without wheezing, dyspnea or hemoptysis, waxing and waning over time and is aggravated by nothing Associated symptoms include:change in voice and sputum production.  She reports hoarseness of voice that started today.  Had increasing coughing episode last night and attributes this to new onset of hoarseness of throat.  Patient does not have new pets. Patient does not have a history of asthma.  Patient does have a history of chronic bronchitis and has had similar symptoms in the past.  Patient does not have a history of environmental allergens.  Patient reports recent sick contact with her cousin 2 to 3 weeks ago who had similar symptoms.  Patient endorses has been remodeling her home recently.  Patient has not recent travel. Patient does have a history of smoking. Patient has previous Chest X-ray.  PERTINENT PMH / PSH: HTN CKD stage III IDA Chronic cough Chronic bronchitis  OBJECTIVE:  BP 115/70 (BP Location: Right Arm, Patient Position: Sitting, Cuff Size: Normal)   Pulse 92   Temp 99.2 F (37.3 C) (Oral)   Ht 5\' 5"  (1.651 m)   Wt 132 lb 9.6 oz (60.1 kg)   SpO2 94%   BMI 22.07 kg/m    Physical Exam Vitals reviewed.  Constitutional:      General: She is not in acute distress.    Appearance: She is not ill-appearing.  HENT:     Head: Normocephalic.     Right Ear: Tympanic membrane, ear canal and external ear normal.     Left Ear: Tympanic membrane, ear canal and external ear normal.     Nose: Nose normal.     Mouth/Throat:     Mouth: Mucous membranes are moist.     Pharynx: Posterior oropharyngeal erythema present.     Tonsils: No tonsillar exudate.  Eyes:     Conjunctiva/sclera: Conjunctivae normal.   Cardiovascular:     Rate and Rhythm: Normal rate.     Pulses: Normal pulses.     Heart sounds: Normal heart sounds.  Pulmonary:     Effort: Pulmonary effort is normal.  Lymphadenopathy:     Cervical: No cervical adenopathy.  Skin:    General: Skin is warm.  Neurological:     Mental Status: She is alert.     ASSESSMENT/PLAN:  Cough in adult patient Assessment & Plan: Suspect viral etiology given recent contact with recent sick person, no fevers.  Good air entry on lung exam without wheezing or rhonchi.  Offered COVID, RSV testing, patient politely declined.  Discussed with patient that likely not bacterial in nature.  Given previous history of smoking and chronic bronchitis will give prescription to patient for Z-Pak today and discussed that if no improvement in 1 to 2 days can fill prescription.  Also discussed taking probiotics while on antibiotics.  Low suspicion for cardiac etiology given patient euvolemic on exam.  Less likely PE given no shortness of breath or tachycardia. Strict return precautions provided.  Orders: -     Benzonatate; Take 1 capsule (200 mg total) by mouth 2 (two) times daily as needed for cough.  Dispense: 20 capsule; Refill: 0 -     Azithromycin; Take 2 tablets on day 1, then 1 tablet  daily on days 2 through 5  Dispense: 6 tablet; Refill: 0    PDMP reviewed  Return if symptoms worsen or fail to improve, for PCP.  Dana Allan, MD

## 2022-03-12 NOTE — Telephone Encounter (Signed)
[facility] Night - Client TELEPHONE ADVICE RECORD AccessNurse Patient Name: Meghan Moody ED Gender: Female DOB: [birth year] Age: 76 Y 3 M Return Phone Number: [phone] (Primary) Address: City/ State/ Zip: [place]  [address] Client [facility] Night - Client Client Site [facility] [place] - Night Provider Tillman Abide- MD Contact Type Call Who Is Calling Patient / Member / Family / Caregiver Call Type Triage / Clinical Relationship To Patient Self Return Phone Number [phone] (Primary) Chief Complaint Cough Reason for Call Symptomatic / Request for Health Information Initial Comment Caller has a lingering cough since last Monday and it's getting worse, she's been taking OTC that is not helping and she's loosing her voice , caller has been treated for bronchitis in the past Translation No Nurse Assessment Nurse: Stefano Gaul, RN, Dwana Curd Date/Time (Eastern Time): 03/12/2022 8:15:27 AM Confirm and document reason for call. If symptomatic, describe symptoms. ---Caller states she has had a cough since last Monday. cough is getting worse. losing her voice. No fever. Coughing up green mucus. Has been using generic mucinex. Does the patient have any new or worsening symptoms? ---Yes Will a triage be completed? ---Yes Related visit to physician within the last 2 weeks? ---No Does the PT have any chronic conditions? (i.e. diabetes, asthma, this includes High risk factors for pregnancy, etc.) ---Yes List chronic conditions. ---HTN Is this a behavioral health or substance abuse call? ---No Guidelines Guideline Title Affirmed Question Affirmed Notes Nurse Date/Time (Eastern Time) Cough - Acute Productive SEVERE coughing spells (e.g., whooping sound after coughing, vomiting after coughing) Stefano Gaul, RN, Dwana Curd 03/12/2022 8:18:47 AM PLEASE NOTE: All timestamps contained within this report are represented as  [place] Standard Time. CONFIDENTIALTY NOTICE: This fax transmission is intended only for the addressee. It contains information that is legally privileged, confidential or otherwise protected from use or disclosure. If you are not the intended recipient, you are strictly prohibited from reviewing, disclosing, copying using or disseminating any of this information or taking any action in reliance on or regarding this information. If you have received this fax in error, please notify us immediately by telephone so that we can arrange for its return to [place]. Phone: [phone], Toll-Free: [phone], Fax: [phone] Page: 2 of 2 Call Id: [id number] Disp. Time Lamount Cohen Time) Disposition Final User 03/12/2022 8:24:23 AM See PCP within 24 Hours Yes Stefano Gaul, RN, Dwana Curd Final Disposition 03/12/2022 8:24:23 AM See PCP within 24 Hours Yes Stefano Gaul, RN, Clerance Lav Disagree/Comply Comply Caller Understands Yes PreDisposition Home Care Care Advice Given Per Guideline SEE PCP WITHIN 24 HOURS: * IF OFFICE WILL BE OPEN: You need to be examined within the next 24 hours. Call your doctor (or NP/PA) when the office opens and make an appointment. COUGHING SPELLS: * Drink warm fluids. Inhale warm mist. This can help relax the airway and also loosen up phlegm. CALL BACK IF: * You become worse * HOME REMEDY - HONEY: This old home remedy has been shown to help decrease coughing at night. The adult dosage is 2 teaspoons (10 ml) at bedtime. * COUGH DROPS: Over-the-counter cough drops can help a lot, especially for mild coughs. They soothe an irritated throat and remove the tickle sensation in the back of the throat. Cough drops are easy to carry with you. Comments User: Art Buff, RN Date/Time Lamount Cohen Time): 03/12/2022 8:25:26 AM transferred to the office for appt Referrals REFERRED TO PCP OFFIC

## 2022-03-12 NOTE — Telephone Encounter (Signed)
Per chart review tab pt has already seen Dr Dana Allan [facility] this morning. Sending note to Dr Clent Ridges.

## 2022-03-12 NOTE — Patient Instructions (Addendum)
It was a pleasure meeting you today. Thank you for allowing me to take part in your health care.  Our goals for today as we discussed include:  For your cough Symptomatic management for  -Honey tea -Stay hydrated -Cepacol lozenges -Continue current Mucinex 600 mg twice daily -Tessalon Pearles 200 mg 2 times a day -If symptoms worsen can fill prescription for Z-Pak and use as directed  If you have worsening symptoms, especially difficulty breathing please call 911 or have someone take you to the emergency department.    If you have any questions or concerns, please do not hesitate to call the office at [phone].  I look forward to our next visit and until then take care and stay safe.  Regards,   Dana Allan, MD   [facility]

## 2022-03-12 NOTE — Assessment & Plan Note (Signed)
Suspect viral etiology given recent contact with recent sick person, no fevers.  Good air entry on lung exam without wheezing or rhonchi.  Offered COVID, RSV testing, patient politely declined.  Discussed with patient that likely not bacterial in nature.  Given previous history of smoking and chronic bronchitis will give prescription to patient for Z-Pak today and discussed that if no improvement in 1 to 2 days can fill prescription.  Also discussed taking probiotics while on antibiotics.  Low suspicion for cardiac etiology given patient euvolemic on exam.  Less likely PE given no shortness of breath or tachycardia. Strict return precautions provided.

## 2022-04-15 ENCOUNTER — Other Ambulatory Visit: Payer: Self-pay | Admitting: Cardiovascular Disease

## 2022-04-17 NOTE — Telephone Encounter (Signed)
Please contact p/t for overdue 3 month f/u with Dr. Fletcher Anon, Last seen 10-24-21.  Medication refill pending appt.  Thank you

## 2022-04-19 NOTE — Telephone Encounter (Signed)
Attempted to contact patient to schedule, no VM set up

## 2022-05-07 ENCOUNTER — Encounter: Payer: Self-pay | Admitting: Oncology

## 2022-05-11 ENCOUNTER — Other Ambulatory Visit: Payer: Self-pay | Admitting: Internal Medicine

## 2022-05-11 ENCOUNTER — Other Ambulatory Visit: Payer: Self-pay | Admitting: *Deleted

## 2022-05-11 DIAGNOSIS — D509 Iron deficiency anemia, unspecified: Secondary | ICD-10-CM

## 2022-05-14 ENCOUNTER — Inpatient Hospital Stay: Payer: Medicare HMO | Attending: Oncology

## 2022-05-14 DIAGNOSIS — D509 Iron deficiency anemia, unspecified: Secondary | ICD-10-CM | POA: Diagnosis not present

## 2022-05-14 DIAGNOSIS — Z87891 Personal history of nicotine dependence: Secondary | ICD-10-CM | POA: Diagnosis not present

## 2022-05-14 DIAGNOSIS — Z7982 Long term (current) use of aspirin: Secondary | ICD-10-CM | POA: Diagnosis not present

## 2022-05-14 DIAGNOSIS — I10 Essential (primary) hypertension: Secondary | ICD-10-CM | POA: Insufficient documentation

## 2022-05-14 DIAGNOSIS — Z7902 Long term (current) use of antithrombotics/antiplatelets: Secondary | ICD-10-CM | POA: Insufficient documentation

## 2022-05-14 DIAGNOSIS — Z79899 Other long term (current) drug therapy: Secondary | ICD-10-CM | POA: Diagnosis not present

## 2022-05-14 LAB — CBC WITH DIFFERENTIAL/PLATELET
Abs Immature Granulocytes: 0.02 10*3/uL (ref 0.00–0.07)
Basophils Absolute: 0.1 10*3/uL (ref 0.0–0.1)
Basophils Relative: 1 %
Eosinophils Absolute: 0.2 10*3/uL (ref 0.0–0.5)
Eosinophils Relative: 2 %
HCT: 45.9 % (ref 36.0–46.0)
Hemoglobin: 15.3 g/dL — ABNORMAL HIGH (ref 12.0–15.0)
Immature Granulocytes: 0 %
Lymphocytes Relative: 18 %
Lymphs Abs: 1.6 10*3/uL (ref 0.7–4.0)
MCH: 31.8 pg (ref 26.0–34.0)
MCHC: 33.3 g/dL (ref 30.0–36.0)
MCV: 95.4 fL (ref 80.0–100.0)
Monocytes Absolute: 0.6 10*3/uL (ref 0.1–1.0)
Monocytes Relative: 7 %
Neutro Abs: 6.4 10*3/uL (ref 1.7–7.7)
Neutrophils Relative %: 72 %
Platelets: 238 10*3/uL (ref 150–400)
RBC: 4.81 MIL/uL (ref 3.87–5.11)
RDW: 14.4 % (ref 11.5–15.5)
WBC: 8.8 10*3/uL (ref 4.0–10.5)
nRBC: 0 % (ref 0.0–0.2)

## 2022-05-14 LAB — IRON AND TIBC
Iron: 111 ug/dL (ref 28–170)
Saturation Ratios: 33 % — ABNORMAL HIGH (ref 10.4–31.8)
TIBC: 337 ug/dL (ref 250–450)
UIBC: 226 ug/dL

## 2022-05-14 LAB — FERRITIN: Ferritin: 63 ng/mL (ref 11–307)

## 2022-05-14 MED FILL — Iron Sucrose Inj 20 MG/ML (Fe Equiv): INTRAVENOUS | Qty: 10 | Status: AC

## 2022-05-15 ENCOUNTER — Inpatient Hospital Stay: Payer: Medicare HMO

## 2022-05-15 ENCOUNTER — Ambulatory Visit: Payer: Medicare PPO | Admitting: Oncology

## 2022-05-15 ENCOUNTER — Encounter: Payer: Self-pay | Admitting: Nurse Practitioner

## 2022-05-15 ENCOUNTER — Inpatient Hospital Stay (HOSPITAL_BASED_OUTPATIENT_CLINIC_OR_DEPARTMENT_OTHER): Payer: Medicare HMO | Admitting: Nurse Practitioner

## 2022-05-15 VITALS — BP 128/80 | HR 87 | Temp 98.0°F | Ht 65.0 in | Wt 132.0 lb

## 2022-05-15 DIAGNOSIS — D509 Iron deficiency anemia, unspecified: Secondary | ICD-10-CM | POA: Diagnosis not present

## 2022-05-15 DIAGNOSIS — Z7902 Long term (current) use of antithrombotics/antiplatelets: Secondary | ICD-10-CM | POA: Diagnosis not present

## 2022-05-15 DIAGNOSIS — I10 Essential (primary) hypertension: Secondary | ICD-10-CM | POA: Diagnosis not present

## 2022-05-15 DIAGNOSIS — Z79899 Other long term (current) drug therapy: Secondary | ICD-10-CM | POA: Diagnosis not present

## 2022-05-15 DIAGNOSIS — Z87891 Personal history of nicotine dependence: Secondary | ICD-10-CM | POA: Diagnosis not present

## 2022-05-15 DIAGNOSIS — Z7982 Long term (current) use of aspirin: Secondary | ICD-10-CM | POA: Diagnosis not present

## 2022-05-15 NOTE — Progress Notes (Signed)
[place]  [phone] [phone]) [phone]  ID: EPIFANIA LITTRELL OB: 02-15-1946  MR#: [id number]  TGG#:[id number]  Patient Care Team: Venia Carbon, MD as PCP - General Venia Carbon, MD as Referring Physician (Pediatrics) Lloyd Huger, MD as Consulting Physician (Oncology) Michael Boston, MD as Consulting Physician (General Surgery) Lin Landsman, MD as Consulting Physician (Gastroenterology) Wellington Hampshire, MD as Consulting Physician (Cardiology)  CHIEF COMPLAINT: Iron deficiency anemia.  INTERVAL HISTORY: Patient returns to clinic today for repeat laboratory work, further evaluation, consideration of additional IV Venofer.  She continues to have chronic weakness and fatigue, but otherwise feels well.  She has no neurologic complaints.  She denies any recent fevers or illnesses.  She has a good appetite and denies weight loss.  She denies any chest pain, shortness of breath, cough, or hemoptysis.  She denies any nausea, vomiting, constipation, or diarrhea.  She has no melena or hematochezia.  She has no urinary complaints.  Patient offers no further specific complaints today.    REVIEW OF SYSTEMS:   Review of Systems  Constitutional:  Negative for chills, diaphoresis, fever, malaise/fatigue and weight loss.  HENT:  Negative for congestion, ear discharge, ear pain, nosebleeds, sinus pain and sore throat.   Eyes:  Negative for pain and redness.  Respiratory:  Negative for cough, hemoptysis, sputum production, shortness of breath, wheezing and stridor.   Cardiovascular:  Negative for chest pain, palpitations and leg swelling.  Gastrointestinal:  Negative for abdominal pain, constipation, diarrhea, nausea and vomiting.  Musculoskeletal:  Negative for falls, joint pain and myalgias.  Skin:  Negative for rash.  Neurological:  Negative for dizziness, loss of consciousness, weakness and headaches.  Psychiatric/Behavioral:  Negative for  depression. The patient is not nervous/anxious and does not have insomnia.   All other systems reviewed and are negative. As per HPI. Otherwise, a complete review of systems is negative.   PAST MEDICAL HISTORY: Past Medical History:  Diagnosis Date   Anemia    Chronic bronchitis (HCC)    Chronic cough    Deafness in left ear    GERD (gastroesophageal reflux disease)    H/O hemorrhoidectomy    Hiatal hernia    Hyperlipidemia    Hypertension    SOB (shortness of breath) on exertion     PAST SURGICAL HISTORY: Past Surgical History:  Procedure Laterality Date   BREAST CYST ASPIRATION Bilateral    neg   BREAST EXCISIONAL BIOPSY Right 1978   neg   BUNIONECTOMY     CATARACT EXTRACTION W/PHACO Right 03/30/2019   Procedure: CATARACT EXTRACTION PHACO AND INTRAOCULAR LENS PLACEMENT (IOC) RIGHT, 1.23, 00:20.8;  Surgeon: Eulogio Bear, MD;  Location: [place];  Service: Ophthalmology;  Laterality: Right;   CATARACT EXTRACTION W/PHACO Left 04/27/2019   Procedure: CATARACT EXTRACTION PHACO AND INTRAOCULAR LENS PLACEMENT (IOC) LEFT 1.46  00:20.3;  Surgeon: Eulogio Bear, MD;  Location: [place];  Service: Ophthalmology;  Laterality: Left;   COLONOSCOPY WITH PROPOFOL N/A 11/02/2019   Procedure: COLONOSCOPY WITH PROPOFOL;  Surgeon: Lin Landsman, MD;  Location: [facility]COPY;  Service: Gastroenterology;  Laterality: N/A;   COLONOSCOPY WITH PROPOFOL N/A 08/01/2021   Procedure: COLONOSCOPY WITH PROPOFOL;  Surgeon: Lin Landsman, MD;  Location: [facility] ENDOSCOPY;  Service: Gastroenterology;  Laterality: N/A;   ESOPHAGOGASTRODUODENOSCOPY (EGD) WITH PROPOFOL N/A 11/02/2019   Procedure: ESOPHAGOGASTRODUODENOSCOPY (EGD) WITH PROPOFOL;  Surgeon: Lin Landsman, MD;  Location: [facility] ENDOSCOPY;  Service: Gastroenterology;  Laterality: N/A;  ESOPHAGOGASTRODUODENOSCOPY (EGD) WITH PROPOFOL N/A 08/01/2021   Procedure: ESOPHAGOGASTRODUODENOSCOPY (EGD) WITH PROPOFOL;   Surgeon: Lin Landsman, MD;  Location: [place];  Service: Gastroenterology;  Laterality: N/A;  with Capsule placement   GIVENS CAPSULE STUDY N/A 08/01/2021   Procedure: GIVENS CAPSULE STUDY;  Surgeon: Lin Landsman, MD;  Location: [facility] ENDOSCOPY;  Service: Gastroenterology;  Laterality: N/A;   TUBAL LIGATION      FAMILY HISTORY: Family History  Problem Relation Age of Onset   Coronary artery disease Mother    Heart disease Mother    Hypertension Mother    Stroke Father    Drug abuse Brother     ADVANCED DIRECTIVES (Y/N):  N   HEALTH MAINTENANCE: Social History   Tobacco Use   Smoking status: Former    Packs/day: 0.30    Years: 40.00    Total pack years: 12.00    Types: Cigarettes    Quit date: 04/16/2006    Years since quitting: 16.0    Passive exposure: Current   Smokeless tobacco: Never  Vaping Use   Vaping Use: Never used  Substance Use Topics   Alcohol use: No   Drug use: No     Colonoscopy:  PAP:  Bone density:  Lipid panel:  No Known Allergies  Current Outpatient Medications  Medication Sig Dispense Refill   acetaminophen (TYLENOL) 500 MG tablet Take 500 mg by mouth every 8 (eight) hours as needed.     aspirin EC 81 MG tablet Take 1 tablet (81 mg total) by mouth daily. Swallow whole.     atorvastatin (LIPITOR) 40 MG tablet TAKE 1 TABLET BY MOUTH EVERY DAY 90 tablet 1   benzonatate (TESSALON) 200 MG capsule Take 1 capsule (200 mg total) by mouth 2 (two) times daily as needed for cough. 20 capsule 0   CEQUA 0.09 % SOLN      cilostazol (PLETAL) 50 MG tablet TAKE 1 TABLET BY MOUTH TWICE A DAY 180 tablet 1   cyclobenzaprine (FLEXERIL) 5 MG tablet Take 5 mg by mouth 3 (three) times daily as needed for muscle spasms.     Lifitegrast (XIIDRA) 5 % SOLN Apply 1 drop to eye 2 (two) times daily. 1 drop each eye daily     Multiple Vitamin (MULTIVITAMIN) tablet Take 1 tablet by mouth daily.     omeprazole (PRILOSEC) 40 MG capsule TAKE 1 CAPSULE BY  MOUTH EVERY DAY 90 capsule 3   OVER THE COUNTER MEDICATION Toe Fungus Medication     RESTASIS 0.05 % ophthalmic emulsion 1 drop 2 (two) times daily.     triamterene-hydrochlorothiazide (MAXZIDE-25) 37.5-25 MG tablet TAKE 1/2 TABLET BY MOUTH EVERY DAY 45 tablet 3   No current facility-administered medications for this visit.    OBJECTIVE: Vitals:   05/15/22 1420  BP: 128/80  Pulse: 87  Temp: 98 F (36.7 C)     Body mass index is 21.97 kg/m.    ECOG FS:0 - Asymptomatic  General: Well-developed, well-nourished, no acute distress. Eyes: Pink conjunctiva, anicteric sclera. Lungs: Clear to auscultation bilaterally.  No audible wheezing or coughing Heart: Regular rate and rhythm.  Abdomen: Soft, nontender, nondistended.  Musculoskeletal: No edema, cyanosis, or clubbing. Neuro: Alert, answering all questions appropriately. Cranial nerves grossly intact. Skin: No rashes or petechiae noted. Psych: Normal affect.   LAB RESULTS: Lab Results  Component Value Date   NA 139 08/23/2021   K 3.7 08/23/2021   CL 103 08/23/2021   CO2 28 08/23/2021   GLUCOSE 86  08/23/2021   BUN 26 (H) 08/23/2021   CREATININE 1.06 08/23/2021   CALCIUM 9.2 08/23/2021   PROT 6.6 11/29/2021   ALBUMIN 4.0 11/29/2021   AST 25 11/29/2021   ALT 26 11/29/2021   ALKPHOS 69 11/29/2021   BILITOT 1.1 11/29/2021   GFRNONAA 42 (L) 08/29/2015   GFRAA 49 (L) 08/29/2015    Lab Results  Component Value Date   WBC 8.8 05/14/2022   NEUTROABS 6.4 05/14/2022   HGB 15.3 (H) 05/14/2022   HCT 45.9 05/14/2022   MCV 95.4 05/14/2022   PLT 238 05/14/2022   Lab Results  Component Value Date   IRON 111 05/14/2022   TIBC 337 05/14/2022   IRONPCTSAT 33 (H) 05/14/2022   Lab Results  Component Value Date   FERRITIN 63 05/14/2022     STUDIES: No results found.  ASSESSMENT: Iron deficiency anemia.  PLAN:    1.  Iron deficiency anemia: Patient's hemoglobin and iron stores continue to be within normal limits.   Patient underwent EGD, video endoscopy, and colonoscopy on August 01, 2021 without evidence of active bleeding and no significant pathology reported.  She was noted to have a large hiatal hernia which is likely etiology of her IDA. She last received treatment on Sep 01, 2021. Hemoglobin normal. Ferritin normal at 63. Clinically asymptomatic. Hold iv iron for now. Return to clinic in 6 months with repeat laboratory work, further evaluation, and continuation of treatment if needed.    2.  Hypertension: Patient's blood pressure is within normal limits today.  Continue monitoring and treatment per primary care.  3.  Hiatal hernia: Patient has been referred to surgery, but is hesitant to undergo any procedures at this time given that she is asymptomatic. Will check b12 at next visit.   Disposition:  No venofer today 6 mo- lab (cbc, cmp, ferritin, iron studies, b12) Day to week later see me or Dr. Grayland Ormond, +/- venofer- la  Patient expressed understanding and was in agreement with this plan. She also understands that She can call clinic at any time with any questions, concerns, or complaints.   Thank you for allowing me to participate in the care of this very pleasant patient.   Verlon Au, NP   05/15/2022

## 2022-05-16 ENCOUNTER — Encounter: Payer: Self-pay | Admitting: Internal Medicine

## 2022-05-17 ENCOUNTER — Ambulatory Visit (INDEPENDENT_AMBULATORY_CARE_PROVIDER_SITE_OTHER): Payer: Medicare HMO | Admitting: Internal Medicine

## 2022-05-17 ENCOUNTER — Encounter: Payer: Self-pay | Admitting: Internal Medicine

## 2022-05-17 VITALS — BP 122/76 | HR 80 | Temp 97.0°F | Ht 65.0 in | Wt 137.0 lb

## 2022-05-17 DIAGNOSIS — B029 Zoster without complications: Secondary | ICD-10-CM | POA: Diagnosis not present

## 2022-05-17 MED ORDER — VALACYCLOVIR HCL 1 G PO TABS: 1000.0000 mg | ORAL_TABLET | Freq: Three times a day (TID) | ORAL | 1 refills | Status: DC

## 2022-05-17 NOTE — Progress Notes (Signed)
Subjective:    Patient ID: Meghan Moody, female    DOB: 08/14/1945, 77 y.o.   MRN: [id number]  HPI Here with possible shingles  Pain for a couple of days First noticed it at night on left when trying to lie on it--and it was itchy Now there is a rash No fever Hasn't tried any Rx  Current Outpatient Medications on File Prior to Visit  Medication Sig Dispense Refill   acetaminophen (TYLENOL) 500 MG tablet Take 500 mg by mouth every 8 (eight) hours as needed.     aspirin EC 81 MG tablet Take 1 tablet (81 mg total) by mouth daily. Swallow whole.     atorvastatin (LIPITOR) 40 MG tablet TAKE 1 TABLET BY MOUTH EVERY DAY 90 tablet 1   CEQUA 0.09 % SOLN      cilostazol (PLETAL) 50 MG tablet TAKE 1 TABLET BY MOUTH TWICE A DAY 180 tablet 1   cyclobenzaprine (FLEXERIL) 5 MG tablet Take 5 mg by mouth 3 (three) times daily as needed for muscle spasms.     Lifitegrast (XIIDRA) 5 % SOLN Apply 1 drop to eye 2 (two) times daily. 1 drop each eye daily     Multiple Vitamin (MULTIVITAMIN) tablet Take 1 tablet by mouth daily.     omeprazole (PRILOSEC) 40 MG capsule TAKE 1 CAPSULE BY MOUTH EVERY DAY 90 capsule 3   OVER THE COUNTER MEDICATION Toe Fungus Medication     RESTASIS 0.05 % ophthalmic emulsion 1 drop 2 (two) times daily.     triamterene-hydrochlorothiazide (MAXZIDE-25) 37.5-25 MG tablet TAKE 1/2 TABLET BY MOUTH EVERY DAY 45 tablet 3   benzonatate (TESSALON) 200 MG capsule Take 1 capsule (200 mg total) by mouth 2 (two) times daily as needed for cough. (Patient not taking: Reported on 05/17/2022) 20 capsule 0   No current facility-administered medications on file prior to visit.    No Known Allergies  Past Medical History:  Diagnosis Date   Anemia    Chronic bronchitis (HCC)    Chronic cough    Deafness in left ear    GERD (gastroesophageal reflux disease)    H/O hemorrhoidectomy    Hiatal hernia    Hyperlipidemia    Hypertension    SOB (shortness of breath) on exertion     Past  Surgical History:  Procedure Laterality Date   BREAST CYST ASPIRATION Bilateral    neg   BREAST EXCISIONAL BIOPSY Right 1978   neg   BUNIONECTOMY     CATARACT EXTRACTION W/PHACO Right 03/30/2019   Procedure: CATARACT EXTRACTION PHACO AND INTRAOCULAR LENS PLACEMENT (IOC) RIGHT, 1.23, 00:20.8;  Surgeon: Eulogio Bear, MD;  Location: [place];  Service: Ophthalmology;  Laterality: Right;   CATARACT EXTRACTION W/PHACO Left 04/27/2019   Procedure: CATARACT EXTRACTION PHACO AND INTRAOCULAR LENS PLACEMENT (IOC) LEFT 1.46  00:20.3;  Surgeon: Eulogio Bear, MD;  Location: [place];  Service: Ophthalmology;  Laterality: Left;   COLONOSCOPY WITH PROPOFOL N/A 11/02/2019   Procedure: COLONOSCOPY WITH PROPOFOL;  Surgeon: Lin Landsman, MD;  Location: [facility] ENDOSCOPY;  Service: Gastroenterology;  Laterality: N/A;   COLONOSCOPY WITH PROPOFOL N/A 08/01/2021   Procedure: COLONOSCOPY WITH PROPOFOL;  Surgeon: Lin Landsman, MD;  Location: [facility] ENDOSCOPY;  Service: Gastroenterology;  Laterality: N/A;   ESOPHAGOGASTRODUODENOSCOPY (EGD) WITH PROPOFOL N/A 11/02/2019   Procedure: ESOPHAGOGASTRODUODENOSCOPY (EGD) WITH PROPOFOL;  Surgeon: Lin Landsman, MD;  Location: [facility] ENDOSCOPY;  Service: Gastroenterology;  Laterality: N/A;   ESOPHAGOGASTRODUODENOSCOPY (EGD) WITH PROPOFOL  N/A 08/01/2021   Procedure: ESOPHAGOGASTRODUODENOSCOPY (EGD) WITH PROPOFOL;  Surgeon: Lin Landsman, MD;  Location: [facility] ENDOSCOPY;  Service: Gastroenterology;  Laterality: N/A;  with Capsule placement   GIVENS CAPSULE STUDY N/A 08/01/2021   Procedure: GIVENS CAPSULE STUDY;  Surgeon: Lin Landsman, MD;  Location: [facility] ENDOSCOPY;  Service: Gastroenterology;  Laterality: N/A;   TUBAL LIGATION      Family History  Problem Relation Age of Onset   Coronary artery disease Mother    Heart disease Mother    Hypertension Mother    Stroke Father    Drug abuse Brother     Social History    Socioeconomic History   Marital status: Widowed    Spouse name: Not on file   Number of children: 1   Years of education: Not on file   Highest education level: Not on file  Occupational History   Occupation: sales, 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  Tobacco Use   Smoking status: Former    Packs/day: 0.30    Years: 40.00    Total pack years: 12.00    Types: Cigarettes    Quit date: 04/16/2006    Years since quitting: 16.0    Passive exposure: Current   Smokeless tobacco: Never  Vaping Use   Vaping Use: Never used  Substance and Sexual Activity   Alcohol use: No   Drug use: No   Sexual activity: Not on file  Other Topics Concern   Not on file  Social History Narrative   Widowed, 1 son   New relationship since 2013      Has living will   Son Selinda Flavin is health care POA   Would accept resuscitation attempts but no prolonged ventilation.   Would probably not accept tube feeds   Social Determinants of Health   Financial Resource Strain: Not on file  Food Insecurity: Not on file  Transportation Needs: Not on file  Physical Activity: Not on file  Stress: Not on file  Social Connections: Not on file  Intimate Partner Violence: Not on file   Review of Systems Doesn't feel sick No other rash No N/V--eating okay    Objective:   Physical Exam Constitutional:      Appearance: Normal appearance.  Skin:    Comments: Classic zoster rash--- around T4-5 on the left with several satellite lesions higher in the back  Neurological:     Mental Status: She is alert.            Assessment & Plan:

## 2022-05-17 NOTE — Assessment & Plan Note (Signed)
Classic rash No systemic symptoms and pain is management Discussed tylenol and topical lidocaine Valacyclovir 1000 tid for a week (#21 x 1)

## 2022-08-28 ENCOUNTER — Ambulatory Visit: Payer: Medicare HMO | Admitting: Internal Medicine

## 2022-08-28 ENCOUNTER — Encounter: Payer: Self-pay | Admitting: Internal Medicine

## 2022-08-28 ENCOUNTER — Telehealth: Payer: Self-pay

## 2022-08-28 VITALS — BP 112/68 | HR 75 | Temp 97.4°F | Ht 64.75 in | Wt 135.0 lb

## 2022-08-28 DIAGNOSIS — N2581 Secondary hyperparathyroidism of renal origin: Secondary | ICD-10-CM

## 2022-08-28 DIAGNOSIS — I10 Essential (primary) hypertension: Secondary | ICD-10-CM

## 2022-08-28 DIAGNOSIS — I739 Peripheral vascular disease, unspecified: Secondary | ICD-10-CM | POA: Diagnosis not present

## 2022-08-28 DIAGNOSIS — F39 Unspecified mood [affective] disorder: Secondary | ICD-10-CM

## 2022-08-28 DIAGNOSIS — N1831 Chronic kidney disease, stage 3a: Secondary | ICD-10-CM

## 2022-08-28 DIAGNOSIS — Z0000 Encounter for general adult medical examination without abnormal findings: Secondary | ICD-10-CM

## 2022-08-28 LAB — CBC
HCT: 47.3 % — ABNORMAL HIGH (ref 36.0–46.0)
Hemoglobin: 15.8 g/dL — ABNORMAL HIGH (ref 12.0–15.0)
MCHC: 33.3 g/dL (ref 30.0–36.0)
MCV: 96.8 fl (ref 78.0–100.0)
Platelets: 245 10*3/uL (ref 150.0–400.0)
RBC: 4.89 Mil/uL (ref 3.87–5.11)
RDW: 14.1 % (ref 11.5–15.5)
WBC: 7.5 10*3/uL (ref 4.0–10.5)

## 2022-08-28 LAB — LIPID PANEL
Cholesterol: 153 mg/dL (ref 0–200)
HDL: 50.4 mg/dL (ref >39.00)
LDL Cholesterol: 78 mg/dL (ref 0–99)
NonHDL: 102.55
Total CHOL/HDL Ratio: 3
Triglycerides: 125 mg/dL (ref 0.0–149.0)
VLDL: 25 mg/dL (ref 0.0–40.0)

## 2022-08-28 LAB — VITAMIN D 25 HYDROXY (VIT D DEFICIENCY, FRACTURES): VITD: 87.78 ng/mL (ref 30.00–100.00)

## 2022-08-28 LAB — HEPATIC FUNCTION PANEL
ALT: 35 U/L (ref 0–35)
AST: 36 U/L (ref 0–37)
Albumin: 4.4 g/dL (ref 3.5–5.2)
Alkaline Phosphatase: 85 U/L (ref 39–117)
Bilirubin, Direct: 0.2 mg/dL (ref 0.0–0.3)
Total Bilirubin: 1 mg/dL (ref 0.2–1.2)
Total Protein: 6.7 g/dL (ref 6.0–8.3)

## 2022-08-28 LAB — RENAL FUNCTION PANEL
Albumin: 4.4 g/dL (ref 3.5–5.2)
BUN: 24 mg/dL — ABNORMAL HIGH (ref 6–23)
CO2: 29 mEq/L (ref 19–32)
Calcium: 9.8 mg/dL (ref 8.4–10.5)
Chloride: 102 mEq/L (ref 96–112)
Creatinine, Ser: 1.09 mg/dL (ref 0.40–1.20)
GFR: 48.93 mL/min — ABNORMAL LOW (ref >60.00)
Glucose, Bld: 83 mg/dL (ref 70–99)
Phosphorus: 3.2 mg/dL (ref 2.3–4.6)
Potassium: 3.7 mEq/L (ref 3.5–5.1)
Sodium: 141 mEq/L (ref 135–145)

## 2022-08-28 NOTE — Assessment & Plan Note (Signed)
Symptoms better on the pletal Also asa 81mg  daily and atorvastatin 40mg 

## 2022-08-28 NOTE — Assessment & Plan Note (Signed)
Elevated PTH in the past On vitamin D

## 2022-08-28 NOTE — Telephone Encounter (Signed)
Left message to return call to our office.  

## 2022-08-28 NOTE — Assessment & Plan Note (Signed)
Mostly reactive depressed times with brother's care needs, etc No MDD No Rx indicated

## 2022-08-28 NOTE — Assessment & Plan Note (Signed)
Has been stable If worsens, will consider nephrology

## 2022-08-28 NOTE — Assessment & Plan Note (Signed)
I have personally reviewed the Medicare Annual Wellness questionnaire and have noted 1. The patient's medical and social history 2. Their use of alcohol, tobacco or illicit drugs 3. Their current medications and supplements 4. The patient's functional ability including ADL's, fall risks, home safety risks and hearing or visual             impairment. 5. Diet and physical activities 6. Evidence for depression or mood disorders  The patients weight, height, BMI and visual acuity have been recorded in the chart I have made referrals, counseling and provided education to the patient based review of the above and I have provided the pt with a written personalized care plan for preventive services.  I have provided you with a copy of your personalized plan for preventive services. Please take the time to review along with your updated medication list.  Had colonoscopy due to anemia--done with these Due for mammogram this year and again at 79 Discussed adding resistance work to walking Prefers no COVID vaccines Flu and RSV in the fall Td at the pharmacy Consider shingrix

## 2022-08-28 NOTE — Assessment & Plan Note (Signed)
BP Readings from Last 3 Encounters:  08/28/22 112/68  05/17/22 122/76  05/15/22 128/80   Fairly low on maxzide 25 Consider change to ARB

## 2022-08-28 NOTE — Progress Notes (Signed)
Subjective:    Patient ID: Meghan Moody, female    DOB: Nov 05, 1945, 77 y.o.   MRN: [id number]  HPI Here for Medicare wellness visit and follow up of chronic health conditions Reviewed advanced directives Reviewed other doctors--Dr Vanga--GI, Dr Rosalva Ferron, Dr Tommye Standard, Dr Arida--cardiology/vascular, Dr Rayburn Felt (treats her dry eye), Dr Delphina Cahill No hospitalizations or surgery in the past year Is walking regularly  Vision is okay Hearing is okay---born deaf on left No alcohol or tobacco Fell once--no major injury Some mood issues--stress with brother (sees him twice a day and coordinates his  24 hourcare mostly) No anhedonic Independent with instrumental ADLs No apparent memory issues  Having more trouble with right hip and left shoulder Hard time standing and getting out of bed on hip Does get better when she is walking on it for a while  Sleeps on left shoulder--and it is sore Takes tylenol once in a while--does help her at night  Now on pletal for PVD No action---apparently has collateral circulation  Last GFR 50 Is taking vitamin D--elevated PTH in the past  No chest pain No SOB No dizziness or syncope Gets "scared feeling" sometimes--like when driving. Aware of feeling but not overt palpitations No edema No headaches  Takes omeprazole daily Keeps heartburn away and no dysphagia  Current Outpatient Medications on File Prior to Visit  Medication Sig Dispense Refill   acetaminophen (TYLENOL) 500 MG tablet Take 500 mg by mouth every 8 (eight) hours as needed.     Ascorbic Acid (VITAMIN C) 1000 MG tablet Take 1,000 mg by mouth daily.     aspirin EC 81 MG tablet Take 1 tablet (81 mg total) by mouth daily. Swallow whole.     atorvastatin (LIPITOR) 40 MG tablet TAKE 1 TABLET BY MOUTH EVERY DAY 90 tablet 1   Cholecalciferol (VITAMIN D) 125 MCG (5000 UT) CAPS Take by mouth daily.     cilostazol (PLETAL) 50 MG tablet TAKE 1 TABLET BY MOUTH  TWICE A DAY 180 tablet 1   Lysine 500 MG TABS Take by mouth daily.     Multiple Vitamin (MULTIVITAMIN) tablet Take 1 tablet by mouth daily.     omeprazole (PRILOSEC) 40 MG capsule TAKE 1 CAPSULE BY MOUTH EVERY DAY 90 capsule 3   OVER THE COUNTER MEDICATION Toe Fungus Medication     RESTASIS 0.05 % ophthalmic emulsion 1 drop 2 (two) times daily.     triamterene-hydrochlorothiazide (MAXZIDE-25) 37.5-25 MG tablet TAKE 1/2 TABLET BY MOUTH EVERY DAY 45 tablet 3   zinc gluconate 50 MG tablet Take 50 mg by mouth daily.     No current facility-administered medications on file prior to visit.    No Known Allergies  Past Medical History:  Diagnosis Date   Anemia    Chronic bronchitis (HCC)    Chronic cough    Deafness in left ear    GERD (gastroesophageal reflux disease)    H/O hemorrhoidectomy    Hiatal hernia    Hyperlipidemia    Hypertension    SOB (shortness of breath) on exertion     Past Surgical History:  Procedure Laterality Date   BREAST CYST ASPIRATION Bilateral    neg   BREAST EXCISIONAL BIOPSY Right 1978   neg   BUNIONECTOMY     CATARACT EXTRACTION W/PHACO Right 03/30/2019   Procedure: CATARACT EXTRACTION PHACO AND INTRAOCULAR LENS PLACEMENT (IOC) RIGHT, 1.23, 00:20.8;  Surgeon: Nevada Crane, MD;  Location[facility];  Service: Ophthalmology;  Laterality: Right;  CATARACT EXTRACTION W/PHACO Left 04/27/2019   Procedure: CATARACT EXTRACTION PHACO AND INTRAOCULAR LENS PLACEMENT (IOC) LEFT 1.46  00:20.3;  Surgeon: Nevada Crane, MD;  Location: [facility];  Service: Ophthalmology;  Laterality: Left;   COLONOSCOPY WITH PROPOFOL N/A 11/02/2019   Procedure: COLONOSCOPY WITH PROPOFOL;  Surgeon: Toney Reil, MD;  Location: [facility] ENDOSCOPY;  Service: Gastroenterology;  Laterality: N/A;   COLONOSCOPY WITH PROPOFOL N/A 08/01/2021   Procedure: COLONOSCOPY WITH PROPOFOL;  Surgeon: Toney Reil, MD;  Location: [facility] ENDOSCOPY;  Service:  Gastroenterology;  Laterality: N/A;   ESOPHAGOGASTRODUODENOSCOPY (EGD) WITH PROPOFOL N/A 11/02/2019   Procedure: ESOPHAGOGASTRODUODENOSCOPY (EGD) WITH PROPOFOL;  Surgeon: Toney Reil, MD;  Location: [facility] ENDOSCOPY;  Service: Gastroenterology;  Laterality: N/A;   ESOPHAGOGASTRODUODENOSCOPY (EGD) WITH PROPOFOL N/A 08/01/2021   Procedure: ESOPHAGOGASTRODUODENOSCOPY (EGD) WITH PROPOFOL;  Surgeon: Toney Reil, MD;  Location: [facility] ENDOSCOPY;  Service: Gastroenterology;  Laterality: N/A;  with Capsule placement   GIVENS CAPSULE STUDY N/A 08/01/2021   Procedure: GIVENS CAPSULE STUDY;  Surgeon: Toney Reil, MD;  Location: [facility] ENDOSCOPY;  Service: Gastroenterology;  Laterality: N/A;   TUBAL LIGATION      Family History  Problem Relation Age of Onset   Coronary artery disease Mother    Heart disease Mother    Hypertension Mother    Stroke Father    Drug abuse Brother     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  Tobacco Use   Smoking status: Former    Packs/day: 0.30    Years: 40.00    Additional pack years: 0.00    Total pack years: 12.00    Types: Cigarettes    Quit date: 04/16/2006    Years since quitting: 16.3    Passive exposure: Current   Smokeless tobacco: Never  Vaping Use   Vaping Use: Never used  Substance and Sexual Activity   Alcohol use: No   Drug use: No   Sexual activity: Not on file  Other Topics Concern   Not on file  Social History Narrative   Widowed, 1 son   New relationship since 2013      Has living will   Son Meghan Moody is health care POA   Would accept resuscitation attempts but no prolonged ventilation.   Would probably not accept tube feeds   Social Determinants of Health   Financial Resource Strain: Not on  file  Food Insecurity: Not on file  Transportation Needs: Not on file  Physical Activity: Not on file  Stress: Not on file  Social Connections: Not on file  Intimate Partner Violence: Not on file   Review of Systems Appetite is good Weight stable Chronic sleep problems--up to void and can't get back to sleep. Goes back a year. Melatonin helps Teeth okay---keeps up with dentist No suspicious skin lesions Wears seat belt Bowels are fine--no blood Did see surgeon about hiatal hernia---but is holding off    Objective:   Physical Exam Constitutional:      Appearance: Normal appearance.  HENT:     Mouth/Throat:     Pharynx: No oropharyngeal exudate or posterior oropharyngeal erythema.  Eyes:     Conjunctiva/sclera: Conjunctivae normal.     Pupils: Pupils are equal, round, and  reactive to light.  Cardiovascular:     Rate and Rhythm: Normal rate and regular rhythm.     Heart sounds: No murmur heard.    No gallop.     Comments: Faint pulse right foot, absent left Pulmonary:     Effort: Pulmonary effort is normal.     Breath sounds: Normal breath sounds. No wheezing or rales.  Abdominal:     Palpations: Abdomen is soft.     Tenderness: There is no abdominal tenderness.  Musculoskeletal:     Cervical back: Neck supple.     Right lower leg: No edema.     Left lower leg: No edema.  Lymphadenopathy:     Cervical: No cervical adenopathy.  Skin:    Findings: No rash.  Neurological:     General: No focal deficit present.     Mental Status: She is alert and oriented to person, place, and time.     Comments: Word naming  11/1 minute Recall 3/3  Psychiatric:        Mood and Affect: Mood normal.        Behavior: Behavior normal.            Assessment & Plan:

## 2022-08-29 LAB — PARATHYROID HORMONE, INTACT (NO CA): PTH: 47 pg/mL (ref 16–77)

## 2022-10-05 NOTE — Progress Notes (Unsigned)
Cardiology Office Note    Date:  10/09/2022   ID:  Leiani, Enright 29-Jul-1945, MRN [id number]  PCP:  [name], MD  Cardiologist:  Lorine Bears, MD  Electrophysiologist:  None   Chief Complaint: Follow up  History of Present Illness:   IVYANNA Moody is a 77 y.o. female with history of PAD, HTN, HLD, prior tobacco use, anemia, and GERD/hiatal hernia who presents for follow up of PAD.  She was initially and last evaluated by Dr.Arida in 10/2021 for PAD.  Prior ABI in 2017 was normal.  She had reported bilateral calf claudication worse on the left side to her PCP in 2023 with noninvasive vascular imaging showing an ABI of 0.58 on the right and 0.60 on the left.  Duplex shows severe right SFA stenosis and occluded left SFA from the proximal to distal segment with reconstitution in the proximal popliteal artery.  She was without symptoms of angina or cardiac decompensation.  She was without critical limb ischemia and was initiated on aspirin 81 mg daily and cilostazol 50 mg twice daily with recommendation to begin a walking regimen and follow-up in 3 months with consideration for angiography if there was no improvement in symptoms.  She comes in today and is without symptoms of angina or cardiac decompensation.  She notes improvement in her lower extremity claudication with initiation of cilostazol and walking regimen.  She indicates she tries to walk twice daily at least achieving 5000 steps per day.  She does request a referral to orthopedics for right hip pain that she notes is worse when going up stairs or when standing up out of a car.  The more she walks the better her hip feels.  No nonhealing wounds along the lower extremities.  No dizziness, presyncope, or syncope.  She does forget to take her p.m. dose of cilostazol sometimes.  Otherwise, she has no acute cardiac concerns at this time.   Labs independently reviewed: 08/2022 - TC 153, TG 125, HDL 50, LDL 78, Hgb 15.8, PLT 245,  potassium 3.7, BUN 24, serum creatinine 1.09, albumin 4.4, AST/ALT normal  Past Medical History:  Diagnosis Date   Anemia    Chronic bronchitis (HCC)    Chronic cough    Deafness in left ear    GERD (gastroesophageal reflux disease)    H/O hemorrhoidectomy    Hiatal hernia    Hyperlipidemia    Hypertension    SOB (shortness of breath) on exertion     Past Surgical History:  Procedure Laterality Date   BREAST CYST ASPIRATION Bilateral    neg   BREAST EXCISIONAL BIOPSY Right 1978   neg   BUNIONECTOMY     CATARACT EXTRACTION W/PHACO Right 03/30/2019   Procedure: CATARACT EXTRACTION PHACO AND INTRAOCULAR LENS PLACEMENT (IOC) RIGHT, 1.23, 00:20.8;  Surgeon: Nevada Crane, MD;  Location: [facility];  Service: Ophthalmology;  Laterality: Right;   CATARACT EXTRACTION W/PHACO Left 04/27/2019   Procedure: CATARACT EXTRACTION PHACO AND INTRAOCULAR LENS PLACEMENT (IOC) LEFT 1.46  00:20.3;  Surgeon: Nevada Crane, MD;  Location: [facility];  Service: Ophthalmology;  Laterality: Left;   COLONOSCOPY WITH PROPOFOL N/A 11/02/2019   Procedure: COLONOSCOPY WITH PROPOFOL;  Surgeon: Toney Reil, MD;  Location: [facility] ENDOSCOPY;  Service: Gastroenterology;  Laterality: N/A;   COLONOSCOPY WITH PROPOFOL N/A 08/01/2021   Procedure: COLONOSCOPY WITH PROPOFOL;  Surgeon: Toney Reil, MD;  Location: [facility] ENDOSCOPY;  Service: Gastroenterology;  Laterality: N/A;   ESOPHAGOGASTRODUODENOSCOPY (  EGD) WITH PROPOFOL N/A 11/02/2019   Procedure: ESOPHAGOGASTRODUODENOSCOPY (EGD) WITH PROPOFOL;  Surgeon: Toney Reil, MD;  Location: [facility] ENDOSCOPY;  Service: Gastroenterology;  Laterality: N/A;   ESOPHAGOGASTRODUODENOSCOPY (EGD) WITH PROPOFOL N/A 08/01/2021   Procedure: ESOPHAGOGASTRODUODENOSCOPY (EGD) WITH PROPOFOL;  Surgeon: Toney Reil, MD;  Location: [facility] ENDOSCOPY;  Service: Gastroenterology;  Laterality: N/A;  with Capsule placement   GIVENS CAPSULE STUDY N/A  08/01/2021   Procedure: GIVENS CAPSULE STUDY;  Surgeon: Toney Reil, MD;  Location: [facility] ENDOSCOPY;  Service: Gastroenterology;  Laterality: N/A;   TUBAL LIGATION      Current Medications: Current Meds  Medication Sig   acetaminophen (TYLENOL) 500 MG tablet Take 500 mg by mouth every 8 (eight) hours as needed.   aspirin EC 81 MG tablet Take 1 tablet (81 mg total) by mouth daily. Swallow whole.   atorvastatin (LIPITOR) 40 MG tablet TAKE 1 TABLET BY MOUTH EVERY DAY   Cholecalciferol (VITAMIN D) 125 MCG (5000 UT) CAPS Take by mouth daily.   cilostazol (PLETAL) 50 MG tablet TAKE 1 TABLET BY MOUTH TWICE A DAY   Multiple Vitamin (MULTIVITAMIN) tablet Take 1 tablet by mouth daily.   omeprazole (PRILOSEC) 40 MG capsule TAKE 1 CAPSULE BY MOUTH EVERY DAY   triamterene-hydrochlorothiazide (MAXZIDE-25) 37.5-25 MG tablet TAKE 1/2 TABLET BY MOUTH EVERY DAY    Allergies:   Patient has no known allergies.   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  Tobacco Use   Smoking status: Former    Packs/day: 0.30    Years: 40.00    Additional pack years: 0.00    Total pack years: 12.00    Types: Cigarettes    Quit date: 04/16/2006    Years since quitting: 16.4    Passive exposure: Current   Smokeless tobacco: Never  Vaping Use   Vaping Use: Never used  Substance and Sexual Activity   Alcohol use: No   Drug use: No   Sexual activity: Not on file  Other Topics Concern   Not on file  Social History Narrative   Widowed, 1 son   New relationship since 2013      Has living will   Son [name] is health care POA   Would accept resuscitation attempts but no prolonged ventilation.   Would probably not accept tube feeds   Social Determinants of Health   Financial Resource  Strain: Not on file  Food Insecurity: Not on file  Transportation Needs: Not on file  Physical Activity: Not on file  Stress: Not on file  Social Connections: Not on file     Family History:  The patient's family history includes Coronary artery disease in her mother; Drug abuse in her brother; Heart disease in her mother; Hypertension in her mother; Stroke in her father.  ROS:   12-point review of systems is negative unless otherwise noted in the HPI.   EKGs/Labs/Other Studies Reviewed:    Studies reviewed were summarized above. The additional studies were reviewed today:  Lower extremity arterial ultrasound 10/13/2021: Summary:  Right: 75-99% stenosis noted in the superficial femoral artery.   Left: 50-74% stenosis noted in the deep femoral artery. Total occlusion  noted in the superficial femoral artery. Total occlusion noted in the  superficial femoral artery and/or popliteal artery. Total occlusion noted  in the peroneal artery.  __________  ABI 10/11/2021: ABI/TBIToday's ABIToday's TBIPrevious ABIPrevious TBI  +-------+-----------+-----------+------------+------------+  Right 0.58       0.27       1.32                      +-------+-----------+-----------+------------+------------+  Left  0.6        0.45       1.21                      +-------+-----------+-----------+------------+------------+   Bilateral ABIs appear decreased compared to prior study on 02/28/16. Dr  Alphonsus Sias messaged with preliminary results and a request to order and  schedule a follow-up duplex exam    Summary:  Right: Resting right ankle-brachial index indicates moderate right lower  extremity arterial disease. The right toe-brachial index is abnormal.   Left: Resting left ankle-brachial index indicates moderate left lower  extremity arterial disease. The left toe-brachial index is abnormal.     EKG:  EKG is ordered today.  The EKG ordered today demonstrates NSR, 72 bpm, no  acute ST-T changes  Recent Labs: 08/28/2022: ALT 35; BUN 24; Creatinine, Ser 1.09; Hemoglobin 15.8; Platelets 245.0; Potassium 3.7; Sodium 141  Recent Lipid Panel    Component Value Date/Time   CHOL 153 08/28/2022 1219   TRIG 125.0 08/28/2022 1219   HDL 50.40 08/28/2022 1219   CHOLHDL 3 08/28/2022 1219   VLDL 25.0 08/28/2022 1219   LDLCALC 78 08/28/2022 1219   LDLDIRECT 163.1 11/10/2012 1249    PHYSICAL EXAM:    VS:  BP 126/72 (BP Location: Left Arm, Patient Position: Sitting, Cuff Size: Normal)   Pulse 77   Ht 5\' 4"  (1.626 m)   Wt 135 lb 12.8 oz (61.6 kg)   SpO2 94%   BMI 23.31 kg/m   BMI: Body mass index is 23.31 kg/m.  Physical Exam Vitals reviewed.  Constitutional:      Appearance: She is well-developed.  HENT:     Head: Normocephalic and atraumatic.  Eyes:     General:        Right eye: No discharge.        Left eye: No discharge.  Neck:     Vascular: No JVD.  Cardiovascular:     Rate and Rhythm: Normal rate and regular rhythm.     Pulses:          Popliteal pulses are 1+ on the right side and 1+ on the left side.       Posterior tibial pulses are 2+ on the right side and 1+ on the left side.     Heart sounds: Normal heart sounds, S1 normal and S2 normal. Heart sounds not distant. No midsystolic click and no opening snap. No murmur heard.    No friction rub.  Pulmonary:     Effort: Pulmonary effort is normal. No respiratory distress.     Breath sounds: Normal breath sounds. No decreased breath sounds, wheezing or rales.  Chest:     Chest wall: No tenderness.  Abdominal:     General: There is no distension.     Palpations: Abdomen is soft.     Tenderness: There is no abdominal tenderness.  Musculoskeletal:     Cervical back: Normal range of motion.     Right lower leg: No edema.     Left lower leg: No edema.  Skin:  General: Skin is warm and dry.     Nails: There is no clubbing.  Neurological:     Mental Status: She is alert and oriented to person,  place, and time.  Psychiatric:        Speech: Speech normal.        Behavior: Behavior normal.        Thought Content: Thought content normal.        Judgment: Judgment normal.     Wt Readings from Last 3 Encounters:  10/09/22 135 lb 12.8 oz (61.6 kg)  08/28/22 135 lb (61.2 kg)  05/17/22 137 lb (62.1 kg)     ASSESSMENT & PLAN:   PAD: Overall, symptoms of claudication are improved following initiation of cilostazol and with a walking regimen.  No critical limb ischemia.  Continue daily walking exercise along with aspirin, cilostazol, and atorvastatin.  Schedule ABI and lower extremity arterial ultrasound.  Follow-up with [name] thereafter.   HTN: Blood pressure is well-controlled in the office today.  Remains on triamterene/HCTZ.  HLD: LDL 78 in 08/2022.  Remains on atorvastatin 40 mg.  Heartily diet recommended.  Target LDL less than 70.  Right hip pain: Referral to orthopedics per patient request.    Disposition: F/u with [name] after vascular imaging.   Medication Adjustments/Labs and Tests Ordered: Current medicines are reviewed at length with the patient today.  Concerns regarding medicines are outlined above. Medication changes, Labs and Tests ordered today are summarized above and listed in the Patient Instructions accessible in Encounters.   Signed, Eula Listen, PA-C 10/09/2022 11:37 AM     [facility] [place] [address] [place], [place] [address] [phone]

## 2022-10-09 ENCOUNTER — Encounter: Payer: Self-pay | Admitting: Physician Assistant

## 2022-10-09 ENCOUNTER — Ambulatory Visit: Payer: Medicare HMO | Attending: Physician Assistant | Admitting: Physician Assistant

## 2022-10-09 VITALS — BP 126/72 | HR 77 | Ht 64.0 in | Wt 135.8 lb

## 2022-10-09 DIAGNOSIS — I10 Essential (primary) hypertension: Secondary | ICD-10-CM

## 2022-10-09 DIAGNOSIS — E785 Hyperlipidemia, unspecified: Secondary | ICD-10-CM

## 2022-10-09 DIAGNOSIS — M25551 Pain in right hip: Secondary | ICD-10-CM

## 2022-10-09 DIAGNOSIS — I739 Peripheral vascular disease, unspecified: Secondary | ICD-10-CM

## 2022-10-09 NOTE — Patient Instructions (Signed)
Medication Instructions:  No changes at this time.   *If you need a refill on your cardiac medications before your next appointment, please call your pharmacy*   Lab Work: None  If you have labs (blood work) drawn today and your tests are completely normal, you will receive your results only by: MyChart Message (if you have MyChart) OR A paper copy in the mail If you have any lab test that is abnormal or we need to change your treatment, we will call you to review the results.   Testing/Procedures: Your physician has requested that you have an ankle brachial index (ABI). During this test an ultrasound and blood pressure cuff are used to evaluate the arteries that supply the arms and legs with blood. Allow thirty minutes for this exam. There are no restrictions or special instructions.  Your physician has requested that you have a lower extremity arterial exercise duplex. During this test, exercise and ultrasound are used to evaluate arterial blood flow in the legs. Allow one hour for this exam. There are no restrictions or special instructions.    Follow-Up: At [facility], you and your health needs are our priority.  As part of our continuing mission to provide you with exceptional heart care, we have created designated Provider Care Teams.  These Care Teams include your primary Cardiologist (physician) and Advanced Practice Providers (APPs -  Physician Assistants and Nurse Practitioners) who all work together to provide you with the care you need, when you need it.   Your next appointment:   6 week(s)  Provider:   Lorine Bears, MD ONLY  Referral has been placed for your right hip pan. Someone will be in touch to get that scheduled.

## 2022-10-11 DIAGNOSIS — M25551 Pain in right hip: Secondary | ICD-10-CM | POA: Diagnosis not present

## 2022-10-11 DIAGNOSIS — M7061 Trochanteric bursitis, right hip: Secondary | ICD-10-CM | POA: Diagnosis not present

## 2022-10-12 ENCOUNTER — Other Ambulatory Visit: Payer: Self-pay | Admitting: Cardiovascular Disease

## 2022-10-20 ENCOUNTER — Other Ambulatory Visit: Payer: Self-pay | Admitting: Cardiovascular Disease

## 2022-10-22 DIAGNOSIS — H35033 Hypertensive retinopathy, bilateral: Secondary | ICD-10-CM | POA: Diagnosis not present

## 2022-10-22 DIAGNOSIS — H43813 Vitreous degeneration, bilateral: Secondary | ICD-10-CM | POA: Diagnosis not present

## 2022-10-22 DIAGNOSIS — I10 Essential (primary) hypertension: Secondary | ICD-10-CM | POA: Diagnosis not present

## 2022-10-22 DIAGNOSIS — H16223 Keratoconjunctivitis sicca, not specified as Sjogren's, bilateral: Secondary | ICD-10-CM | POA: Diagnosis not present

## 2022-10-22 DIAGNOSIS — H524 Presbyopia: Secondary | ICD-10-CM | POA: Diagnosis not present

## 2022-11-01 ENCOUNTER — Other Ambulatory Visit: Payer: Self-pay | Admitting: *Deleted

## 2022-11-01 DIAGNOSIS — D509 Iron deficiency anemia, unspecified: Secondary | ICD-10-CM

## 2022-11-01 NOTE — Progress Notes (Signed)
c 

## 2022-11-02 ENCOUNTER — Inpatient Hospital Stay: Payer: Medicare HMO | Attending: Oncology

## 2022-11-02 DIAGNOSIS — Z7982 Long term (current) use of aspirin: Secondary | ICD-10-CM | POA: Insufficient documentation

## 2022-11-02 DIAGNOSIS — D509 Iron deficiency anemia, unspecified: Secondary | ICD-10-CM | POA: Diagnosis not present

## 2022-11-02 DIAGNOSIS — Z87891 Personal history of nicotine dependence: Secondary | ICD-10-CM | POA: Insufficient documentation

## 2022-11-02 DIAGNOSIS — R42 Dizziness and giddiness: Secondary | ICD-10-CM | POA: Insufficient documentation

## 2022-11-02 DIAGNOSIS — Z79899 Other long term (current) drug therapy: Secondary | ICD-10-CM | POA: Insufficient documentation

## 2022-11-02 DIAGNOSIS — K449 Diaphragmatic hernia without obstruction or gangrene: Secondary | ICD-10-CM | POA: Diagnosis not present

## 2022-11-02 LAB — CBC WITH DIFFERENTIAL/PLATELET
Abs Immature Granulocytes: 0.02 10*3/uL (ref 0.00–0.07)
Basophils Absolute: 0.1 10*3/uL (ref 0.0–0.1)
Basophils Relative: 1 %
Eosinophils Absolute: 0.1 10*3/uL (ref 0.0–0.5)
Eosinophils Relative: 2 %
HCT: 42.2 % (ref 36.0–46.0)
Hemoglobin: 14 g/dL (ref 12.0–15.0)
Immature Granulocytes: 0 %
Lymphocytes Relative: 15 %
Lymphs Abs: 1.1 10*3/uL (ref 0.7–4.0)
MCH: 32 pg (ref 26.0–34.0)
MCHC: 33.2 g/dL (ref 30.0–36.0)
MCV: 96.3 fL (ref 80.0–100.0)
Monocytes Absolute: 0.5 10*3/uL (ref 0.1–1.0)
Monocytes Relative: 7 %
Neutro Abs: 5.5 10*3/uL (ref 1.7–7.7)
Neutrophils Relative %: 75 %
Platelets: 204 10*3/uL (ref 150–400)
RBC: 4.38 MIL/uL (ref 3.87–5.11)
RDW: 14 % (ref 11.5–15.5)
WBC: 7.3 10*3/uL (ref 4.0–10.5)
nRBC: 0 % (ref 0.0–0.2)

## 2022-11-02 LAB — CMP (CANCER CENTER ONLY)
ALT: 27 U/L (ref 0–44)
AST: 31 U/L (ref 15–41)
Albumin: 4 g/dL (ref 3.5–5.0)
Alkaline Phosphatase: 77 U/L (ref 38–126)
Anion gap: 7 (ref 5–15)
BUN: 32 mg/dL — ABNORMAL HIGH (ref 8–23)
CO2: 27 mmol/L (ref 22–32)
Calcium: 9.2 mg/dL (ref 8.9–10.3)
Chloride: 106 mmol/L (ref 98–111)
Creatinine: 1.04 mg/dL — ABNORMAL HIGH (ref 0.44–1.00)
GFR, Estimated: 55 mL/min — ABNORMAL LOW (ref >60)
Glucose, Bld: 98 mg/dL (ref 70–99)
Potassium: 3.5 mmol/L (ref 3.5–5.1)
Sodium: 140 mmol/L (ref 135–145)
Total Bilirubin: 0.5 mg/dL (ref 0.3–1.2)
Total Protein: 6.6 g/dL (ref 6.5–8.1)

## 2022-11-02 LAB — IRON AND TIBC
Iron: 81 ug/dL (ref 28–170)
Saturation Ratios: 23 % (ref 10.4–31.8)
TIBC: 353 ug/dL (ref 250–450)
UIBC: 272 ug/dL

## 2022-11-02 LAB — FERRITIN: Ferritin: 18 ng/mL (ref 11–307)

## 2022-11-02 LAB — VITAMIN B12: Vitamin B-12: 623 pg/mL (ref 180–914)

## 2022-11-05 MED FILL — Iron Sucrose Inj 20 MG/ML (Fe Equiv): INTRAVENOUS | Qty: 10 | Status: AC

## 2022-11-06 ENCOUNTER — Inpatient Hospital Stay (HOSPITAL_BASED_OUTPATIENT_CLINIC_OR_DEPARTMENT_OTHER): Payer: Medicare HMO | Admitting: Oncology

## 2022-11-06 ENCOUNTER — Other Ambulatory Visit: Payer: Self-pay | Admitting: Internal Medicine

## 2022-11-06 ENCOUNTER — Inpatient Hospital Stay: Payer: Medicare HMO

## 2022-11-06 ENCOUNTER — Encounter: Payer: Self-pay | Admitting: Oncology

## 2022-11-06 VITALS — BP 145/80 | HR 70 | Temp 98.5°F | Resp 16 | Ht 64.0 in | Wt 136.0 lb

## 2022-11-06 DIAGNOSIS — D509 Iron deficiency anemia, unspecified: Secondary | ICD-10-CM | POA: Diagnosis not present

## 2022-11-06 DIAGNOSIS — Z7982 Long term (current) use of aspirin: Secondary | ICD-10-CM | POA: Diagnosis not present

## 2022-11-06 DIAGNOSIS — K449 Diaphragmatic hernia without obstruction or gangrene: Secondary | ICD-10-CM | POA: Diagnosis not present

## 2022-11-06 DIAGNOSIS — Z79899 Other long term (current) drug therapy: Secondary | ICD-10-CM | POA: Diagnosis not present

## 2022-11-06 DIAGNOSIS — Z87891 Personal history of nicotine dependence: Secondary | ICD-10-CM | POA: Diagnosis not present

## 2022-11-06 DIAGNOSIS — R42 Dizziness and giddiness: Secondary | ICD-10-CM | POA: Diagnosis not present

## 2022-11-06 NOTE — Progress Notes (Signed)
[facility]  [phone] [phone]) [phone]  ID: Meghan Moody OB: May 28, 1945  MR#: [id number]  AOZ#:[id number]  Patient Care Team: [name], MD as PCP - General Iran Ouch, MD as PCP - Cardiology (Cardiology) [name], MD as Referring Physician (Pediatrics) Jeralyn Ruths, MD as Consulting Physician (Oncology) Karie Soda, MD as Consulting Physician (General Surgery) Toney Reil, MD as Consulting Physician (Gastroenterology) Iran Ouch, MD as Consulting Physician (Cardiology)  CHIEF COMPLAINT: Iron deficiency anemia.  INTERVAL HISTORY: Patient returns to clinic today for repeat laboratory work, further evaluation, and consideration of additional IV Venofer.  She has some mild dizziness since initiating meloxicam, but otherwise feels well.  She does not complain of weakness and fatigue today.  She has no other neurologic complaints.  She denies any recent fevers or illnesses.  She has a good appetite and denies weight loss.  She denies any chest pain, shortness of breath, cough, or hemoptysis.  She denies any nausea, vomiting, constipation, or diarrhea.  She has no melena or hematochezia.  She has no urinary complaints.  Patient offers no further specific complaints today.  REVIEW OF SYSTEMS:   Review of Systems  Constitutional: Negative.  Negative for fever, malaise/fatigue and weight loss.  Respiratory: Negative.  Negative for cough, hemoptysis and shortness of breath.   Cardiovascular: Negative.  Negative for chest pain and leg swelling.  Gastrointestinal: Negative.  Negative for abdominal pain, blood in stool and melena.  Genitourinary: Negative.  Negative for hematuria.  Musculoskeletal: Negative.  Negative for back pain.  Skin: Negative.  Negative for rash.  Neurological:  Positive for dizziness. Negative for focal weakness, weakness and headaches.  Psychiatric/Behavioral: Negative.  The patient is not  nervous/anxious.     As per HPI. Otherwise, a complete review of systems is negative.  PAST MEDICAL HISTORY: Past Medical History:  Diagnosis Date   Anemia    Chronic bronchitis (HCC)    Chronic cough    Deafness in left ear    GERD (gastroesophageal reflux disease)    H/O hemorrhoidectomy    Hiatal hernia    Hyperlipidemia    Hypertension    SOB (shortness of breath) on exertion     PAST SURGICAL HISTORY: Past Surgical History:  Procedure Laterality Date   BREAST CYST ASPIRATION Bilateral    neg   BREAST EXCISIONAL BIOPSY Right 1978   neg   BUNIONECTOMY     CATARACT EXTRACTION W/PHACO Right 03/30/2019   Procedure: CATARACT EXTRACTION PHACO AND INTRAOCULAR LENS PLACEMENT (IOC) RIGHT, 1.23, 00:20.8;  Surgeon: Nevada Crane, MD;  Location: [facility];  Service: Ophthalmology;  Laterality: Right;   CATARACT EXTRACTION W/PHACO Left 04/27/2019   Procedure: CATARACT EXTRACTION PHACO AND INTRAOCULAR LENS PLACEMENT (IOC) LEFT 1.46  00:20.3;  Surgeon: Nevada Crane, MD;  Location: [facility];  Service: Ophthalmology;  Laterality: Left;   COLONOSCOPY WITH PROPOFOL N/A 11/02/2019   Procedure: COLONOSCOPY WITH PROPOFOL;  Surgeon: Toney Reil, MD;  Location: [facility] ENDOSCOPY;  Service: Gastroenterology;  Laterality: N/A;   COLONOSCOPY WITH PROPOFOL N/A 08/01/2021   Procedure: COLONOSCOPY WITH PROPOFOL;  Surgeon: Toney Reil, MD;  Location: [facility] ENDOSCOPY;  Service: Gastroenterology;  Laterality: N/A;   ESOPHAGOGASTRODUODENOSCOPY (EGD) WITH PROPOFOL N/A 11/02/2019   Procedure: ESOPHAGOGASTRODUODENOSCOPY (EGD) WITH PROPOFOL;  Surgeon: Toney Reil, MD;  Location: [facility] ENDOSCOPY;  Service: Gastroenterology;  Laterality: N/A;   ESOPHAGOGASTRODUODENOSCOPY (EGD) WITH PROPOFOL N/A 08/01/2021   Procedure: ESOPHAGOGASTRODUODENOSCOPY (EGD) WITH PROPOFOL;  Surgeon: Toney Reil, MD;  Location: [facility] ENDOSCOPY;  Service: Gastroenterology;  Laterality:  N/A;  with Capsule placement   GIVENS CAPSULE STUDY N/A 08/01/2021   Procedure: GIVENS CAPSULE STUDY;  Surgeon: Toney Reil, MD;  Location: [facility] ENDOSCOPY;  Service: Gastroenterology;  Laterality: N/A;   TUBAL LIGATION      FAMILY HISTORY: Family History  Problem Relation Age of Onset   Coronary artery disease Mother    Heart disease Mother    Hypertension Mother    Stroke Father    Drug abuse Brother     ADVANCED DIRECTIVES (Y/N):  N  HEALTH MAINTENANCE: Social History   Tobacco Use   Smoking status: Former    Current packs/day: 0.00    Average packs/day: 0.3 packs/day for 40.0 years (12.0 ttl pk-yrs)    Types: Cigarettes    Start date: 04/16/1966    Quit date: 04/16/2006    Years since quitting: 16.5    Passive exposure: Current   Smokeless tobacco: Never  Vaping Use   Vaping status: Never Used  Substance Use Topics   Alcohol use: No   Drug use: No     Colonoscopy:  PAP:  Bone density:  Lipid panel:  No Known Allergies  Current Outpatient Medications  Medication Sig Dispense Refill   acetaminophen (TYLENOL) 500 MG tablet Take 500 mg by mouth every 8 (eight) hours as needed.     aspirin EC 81 MG tablet Take 1 tablet (81 mg total) by mouth daily. Swallow whole.     atorvastatin (LIPITOR) 40 MG tablet TAKE 1 TABLET BY MOUTH EVERY DAY 90 tablet 0   cilostazol (PLETAL) 50 MG tablet TAKE 1 TABLET BY MOUTH TWICE A DAY 180 tablet 0   meloxicam (MOBIC) 15 MG tablet Take 15 mg by mouth daily.     Multiple Vitamin (MULTIVITAMIN) tablet Take 1 tablet by mouth daily.     omeprazole (PRILOSEC) 40 MG capsule TAKE 1 CAPSULE BY MOUTH EVERY DAY 90 capsule 3   triamterene-hydrochlorothiazide (MAXZIDE-25) 37.5-25 MG tablet TAKE 1/2 TABLET BY MOUTH DAILY 45 tablet 3   Ascorbic Acid (VITAMIN C) 1000 MG tablet Take 1,000 mg by mouth daily. (Patient not taking: Reported on 10/09/2022)     Cholecalciferol (VITAMIN D) 125 MCG (5000 UT) CAPS Take by mouth daily. (Patient not taking:  Reported on 11/06/2022)     Lysine 500 MG TABS Take by mouth daily. (Patient not taking: Reported on 10/09/2022)     OVER THE COUNTER MEDICATION Toe Fungus Medication (Patient not taking: Reported on 10/09/2022)     RESTASIS 0.05 % ophthalmic emulsion 1 drop 2 (two) times daily. (Patient not taking: Reported on 10/09/2022)     zinc gluconate 50 MG tablet Take 50 mg by mouth daily. (Patient not taking: Reported on 10/09/2022)     No current facility-administered medications for this visit.    OBJECTIVE: Vitals:   11/06/22 1303  BP: (!) 145/80  Pulse: 70  Resp: 16  Temp: 98.5 F (36.9 C)  SpO2: 96%      Body mass index is 23.34 kg/m.    ECOG FS:0 - Asymptomatic  General: Well-developed, well-nourished, no acute distress. Eyes: Pink conjunctiva, anicteric sclera. HEENT: Normocephalic, moist mucous membranes. Lungs: No audible wheezing or coughing. Heart: Regular rate and rhythm. Abdomen: Soft, nontender, no obvious distention. Musculoskeletal: No edema, cyanosis, or clubbing. Neuro: Alert, answering all questions appropriately. Cranial nerves grossly intact. Skin: No rashes or petechiae noted. Psych: Normal affect.   LAB RESULTS:  Lab Results  Component Value Date   NA 140 11/02/2022   K 3.5 11/02/2022   CL 106 11/02/2022   CO2 27 11/02/2022   GLUCOSE 98 11/02/2022   BUN 32 (H) 11/02/2022   CREATININE 1.04 (H) 11/02/2022   CALCIUM 9.2 11/02/2022   PROT 6.6 11/02/2022   ALBUMIN 4.0 11/02/2022   AST 31 11/02/2022   ALT 27 11/02/2022   ALKPHOS 77 11/02/2022   BILITOT 0.5 11/02/2022   GFRNONAA 55 (L) 11/02/2022   GFRAA 49 (L) 08/29/2015    Lab Results  Component Value Date   WBC 7.3 11/02/2022   NEUTROABS 5.5 11/02/2022   HGB 14.0 11/02/2022   HCT 42.2 11/02/2022   MCV 96.3 11/02/2022   PLT 204 11/02/2022   Lab Results  Component Value Date   IRON 81 11/02/2022   TIBC 353 11/02/2022   IRONPCTSAT 23 11/02/2022   Lab Results  Component Value Date   FERRITIN  18 11/02/2022     STUDIES: No results found.  ASSESSMENT: Iron deficiency anemia.  PLAN:    Iron deficiency anemia: Patient's hemoglobin and iron stores continue to be within normal limits.  She is also asymptomatic. Patient underwent EGD, video endoscopy, and colonoscopy on August 01, 2021 with no evidence of active bleeding and no significant pathology reported.  She was noted to have a large hiatal hernia in which GI recommended surgical evaluation.  Patient previously reported she is hesitant to do this given the fact that her hemoglobin is normal and she is no longer bleeding.  She does not require additional IV Venofer today.  She last received treatment on Sep 01, 2021.  After lengthy discussion with the patient, is agreed upon that no further follow-up is necessary.  Please refer patient back if there are any questions or concerns. Dizziness: Unclear if related to meloxicam or not.  Continue treatment and monitoring per primary care. Hiatal hernia: Previously patient was referred to surgery, but is hesitant to undergo any procedures at this time.   Patient expressed understanding and was in agreement with this plan. She also understands that She can call clinic at any time with any questions, concerns, or complaints.     Jeralyn Ruths, MD   11/06/2022 1:32 PM

## 2022-11-13 ENCOUNTER — Other Ambulatory Visit: Payer: Medicare HMO

## 2022-11-28 ENCOUNTER — Ambulatory Visit: Payer: Medicare HMO

## 2022-11-28 DIAGNOSIS — I739 Peripheral vascular disease, unspecified: Secondary | ICD-10-CM

## 2022-11-28 LAB — VAS US ABI WITH/WO TBI
Left ABI: 0.64
Right ABI: 0.85

## 2022-11-29 ENCOUNTER — Ambulatory Visit: Payer: Medicare HMO | Attending: Cardiovascular Disease | Admitting: Cardiovascular Disease

## 2022-11-29 ENCOUNTER — Encounter: Payer: Self-pay | Admitting: Cardiovascular Disease

## 2022-11-29 VITALS — BP 110/74 | HR 83 | Ht 65.0 in | Wt 134.0 lb

## 2022-11-29 DIAGNOSIS — I10 Essential (primary) hypertension: Secondary | ICD-10-CM

## 2022-11-29 DIAGNOSIS — I739 Peripheral vascular disease, unspecified: Secondary | ICD-10-CM

## 2022-11-29 DIAGNOSIS — E785 Hyperlipidemia, unspecified: Secondary | ICD-10-CM | POA: Diagnosis not present

## 2022-11-29 MED ORDER — CILOSTAZOL 50 MG PO TABS: 50.0000 mg | ORAL_TABLET | Freq: Two times a day (BID) | ORAL | 3 refills | Status: DC

## 2022-11-29 MED ORDER — ATORVASTATIN CALCIUM 40 MG PO TABS: 40.0000 mg | ORAL_TABLET | Freq: Every day | ORAL | 3 refills | Status: DC

## 2022-11-29 NOTE — Patient Instructions (Addendum)
Medication Instructions:  No changes *If you need a refill on your cardiac medications before your next appointment, please call your pharmacy*   Lab Work: None ordered If you have labs (blood work) drawn today and your tests are completely normal, you will receive your results only by: MyChart Message (if you have MyChart) OR A paper copy in the mail If you have any lab test that is abnormal or we need to change your treatment, we will call you to review the results.   Testing/Procedures: None ordered   Follow-Up: At [facility], you and your health needs are our priority.  As part of our continuing mission to provide you with exceptional heart care, we have created designated Provider Care Teams.  These Care Teams include your primary Cardiologist (physician) and Advanced Practice Providers (APPs -  Physician Assistants and Nurse Practitioners) who all work together to provide you with the care you need, when you need it.  We recommend signing up for the patient portal called "MyChart".  Sign up information is provided on this After Visit Summary.  MyChart is used to connect with patients for Virtual Visits (Telemedicine).  Patients are able to view lab/test results, encounter notes, upcoming appointments, etc.  Non-urgent messages can be sent to your provider as well.   To learn more about what you can do with MyChart, go to [email or web address].    Your next appointment:   12 month(s)  Provider:   You may see Muhammad Arida, MD or one of the following Advanced Practice Providers on your designated Care Team:   Christopher Berge, NP Ryan Dunn, PA-C Cadence Furth, PA-C Sheri Hammock, NP    

## 2022-11-29 NOTE — Progress Notes (Signed)
Cardiology Office Note   Date:  11/29/2022   ID:  Meghan Moody, DOB July 17, 1945, MRN [id number]  PCP:  Meghan Schwalbe, MD  Cardiologist:   Meghan Bears, MD   Chief Complaint  Patient presents with   Follow-up    6 Wk f/u LE/ABI [place] no complaints today. Meds reviewed verbally with pt.      History of Present Illness: Meghan Moody is a 77 y.o. female who is here today for a follow-up visit regarding peripheral arterial disease.   She has past medical history of essential hypertension, hyperlipidemia, GERD/hiatal hernia, previous tobacco use and anemia.  She quit smoking in 2008 and was not a heavy smoker. She is not diabetic. She is followed for peripheral arterial disease with bilateral calf claudication that is worse on the left side.  Her left SFA is chronically occluded with collaterals. She had recent repeat Doppler study that showed improvement in ABI on the right side and stable on the left side.  She denies any pain or cramping in her legs but she does complain of left leg fatigue with walking that has improved with cilostazol.  She walks her dog daily sometimes twice a day.  She is usually able to walk 5000 steps in 24 hours. No chest pain or shortness of breath.  Past Medical History:  Diagnosis Date   Anemia    Chronic bronchitis (HCC)    Chronic cough    Deafness in left ear    GERD (gastroesophageal reflux disease)    H/O hemorrhoidectomy    Hiatal hernia    Hyperlipidemia    Hypertension    SOB (shortness of breath) on exertion     Past Surgical History:  Procedure Laterality Date   BREAST CYST ASPIRATION Bilateral    neg   BREAST EXCISIONAL BIOPSY Right 1978   neg   BUNIONECTOMY     CATARACT EXTRACTION W/PHACO Right 03/30/2019   Procedure: CATARACT EXTRACTION PHACO AND INTRAOCULAR LENS PLACEMENT (IOC) RIGHT, 1.23, 00:20.8;  Surgeon: Nevada Crane, MD;  Location: [facility];  Service: Ophthalmology;  Laterality: Right;   CATARACT  EXTRACTION W/PHACO Left 04/27/2019   Procedure: CATARACT EXTRACTION PHACO AND INTRAOCULAR LENS PLACEMENT (IOC) LEFT 1.46  00:20.3;  Surgeon: Nevada Crane, MD;  Location: [facility];  Service: Ophthalmology;  Laterality: Left;   COLONOSCOPY WITH PROPOFOL N/A 11/02/2019   Procedure: COLONOSCOPY WITH PROPOFOL;  Surgeon: Toney Reil, MD;  Location: [facility] ENDOSCOPY;  Service: Gastroenterology;  Laterality: N/A;   COLONOSCOPY WITH PROPOFOL N/A 08/01/2021   Procedure: COLONOSCOPY WITH PROPOFOL;  Surgeon: Toney Reil, MD;  Location: [facility] ENDOSCOPY;  Service: Gastroenterology;  Laterality: N/A;   ESOPHAGOGASTRODUODENOSCOPY (EGD) WITH PROPOFOL N/A 11/02/2019   Procedure: ESOPHAGOGASTRODUODENOSCOPY (EGD) WITH PROPOFOL;  Surgeon: Toney Reil, MD;  Location: [facility] ENDOSCOPY;  Service: Gastroenterology;  Laterality: N/A;   ESOPHAGOGASTRODUODENOSCOPY (EGD) WITH PROPOFOL N/A 08/01/2021   Procedure: ESOPHAGOGASTRODUODENOSCOPY (EGD) WITH PROPOFOL;  Surgeon: Toney Reil, MD;  Location: [facility] ENDOSCOPY;  Service: Gastroenterology;  Laterality: N/A;  with Capsule placement   GIVENS CAPSULE STUDY N/A 08/01/2021   Procedure: GIVENS CAPSULE STUDY;  Surgeon: Toney Reil, MD;  Location: [facility] ENDOSCOPY;  Service: Gastroenterology;  Laterality: N/A;   TUBAL LIGATION       Current Outpatient Medications  Medication Sig Dispense Refill   acetaminophen (TYLENOL) 500 MG tablet Take 500 mg by mouth every 8 (eight) hours as needed.     aspirin EC 81 MG  tablet Take 1 tablet (81 mg total) by mouth daily. Swallow whole.     atorvastatin (LIPITOR) 40 MG tablet TAKE 1 TABLET BY MOUTH EVERY DAY 90 tablet 0   cilostazol (PLETAL) 50 MG tablet TAKE 1 TABLET BY MOUTH TWICE A DAY 180 tablet 0   meloxicam (MOBIC) 15 MG tablet Take 15 mg by mouth daily.     omeprazole (PRILOSEC) 40 MG capsule TAKE 1 CAPSULE BY MOUTH EVERY DAY 90 capsule 3   triamterene-hydrochlorothiazide (MAXZIDE-25) 37.5-25  MG tablet TAKE 1/2 TABLET BY MOUTH DAILY 45 tablet 3   Ascorbic Acid (VITAMIN C) 1000 MG tablet Take 1,000 mg by mouth daily. (Patient not taking: Reported on 10/09/2022)     Cholecalciferol (VITAMIN D) 125 MCG (5000 UT) CAPS Take by mouth daily. (Patient not taking: Reported on 11/06/2022)     Lysine 500 MG TABS Take by mouth daily. (Patient not taking: Reported on 10/09/2022)     Multiple Vitamin (MULTIVITAMIN) tablet Take 1 tablet by mouth daily. (Patient not taking: Reported on 11/29/2022)     OVER THE COUNTER MEDICATION Toe Fungus Medication (Patient not taking: Reported on 10/09/2022)     RESTASIS 0.05 % ophthalmic emulsion 1 drop 2 (two) times daily. (Patient not taking: Reported on 10/09/2022)     zinc gluconate 50 MG tablet Take 50 mg by mouth daily. (Patient not taking: Reported on 11/29/2022)     No current facility-administered medications for this visit.    Allergies:   Patient has no known allergies.    Social History:  The patient  reports that she quit smoking about 16 years ago. Her smoking use included cigarettes. She started smoking about 56 years ago. She has a 12 pack-year smoking history. She has been exposed to tobacco smoke. She has never used smokeless tobacco. She reports that she does not drink alcohol and does not use drugs.   Family History:  The patient's family history includes Coronary artery disease in her mother; Drug abuse in her brother; Heart disease in her mother; Hypertension in her mother; Stroke in her father.    ROS:  Please see the history of present illness.   Otherwise, review of systems are positive for none.   All other systems are reviewed and negative.    PHYSICAL EXAM: VS:  BP 110/74 (BP Location: Left Arm, Patient Position: Sitting, Cuff Size: Normal)   Pulse 83   Ht 5\' 5"  (1.651 m)   Wt 134 lb (60.8 kg)   SpO2 98%   BMI 22.30 kg/m  , BMI Body mass index is 22.3 kg/m. GEN: Well nourished, well developed, in no acute distress  HEENT: normal   Neck: no JVD, carotid bruits, or masses Cardiac: RRR; no murmurs, rubs, or gallops,no edema  Respiratory:  clear to auscultation bilaterally, normal work of breathing GI: soft, nontender, nondistended, + BS MS: no deformity or atrophy  Skin: warm and dry, no rash Neuro:  Strength and sensation are intact Psych: euthymic mood, full affect Vascular: Femoral pulses are normal bilaterally.  Distal pulses are palpable on the right side but not the left.   EKG:  EKG is not ordered today.    Recent Labs: 11/02/2022: ALT 27; BUN 32; Creatinine 1.04; Hemoglobin 14.0; Platelets 204; Potassium 3.5; Sodium 140    Lipid Panel    Component Value Date/Time   CHOL 153 08/28/2022 1219   TRIG 125.0 08/28/2022 1219   HDL 50.40 08/28/2022 1219   CHOLHDL 3 08/28/2022 1219   VLDL 25.0  08/28/2022 1219   LDLCALC 78 08/28/2022 1219   LDLDIRECT 163.1 11/10/2012 1249      Wt Readings from Last 3 Encounters:  11/29/22 134 lb (60.8 kg)  11/06/22 136 lb (61.7 kg)  10/09/22 135 lb 12.8 oz (61.6 kg)          10/24/2021    4:42 PM  PAD Screen  Previous PAD dx? No  Previous surgical procedure? No  Pain with walking? Yes  Subsides with rest? Yes  Feet/toe relief with dangling? No  Painful, non-healing ulcers? No  Extremities discolored? No      ASSESSMENT AND PLAN:  1.  Peripheral arterial disease: Left calf claudication due to occluded left SFA.  Her symptoms improved with cilostazol and she currently has mild symptoms that are not lifestyle limiting.  Thus, no indication for revascularization.  Will refill cilostazol.  Continue as daily.  She has no symptoms of coronary artery disease.   I encouraged her to continue daily walking.  2.  Hyperlipidemia: Lipid profile showed an LDL of 78 which is worse at target.  Continue statin 40 mg daily.  3.  Essential hypertension: Blood pressure is controlled.    Disposition:   FU with me in 12 months  Signed,  Meghan Bears, MD  11/29/2022  11:57 AM    [facility]

## 2023-01-21 DIAGNOSIS — H0288A Meibomian gland dysfunction right eye, upper and lower eyelids: Secondary | ICD-10-CM | POA: Diagnosis not present

## 2023-01-21 DIAGNOSIS — H0288B Meibomian gland dysfunction left eye, upper and lower eyelids: Secondary | ICD-10-CM | POA: Diagnosis not present

## 2023-01-21 DIAGNOSIS — H16223 Keratoconjunctivitis sicca, not specified as Sjogren's, bilateral: Secondary | ICD-10-CM | POA: Diagnosis not present

## 2023-01-21 DIAGNOSIS — H26493 Other secondary cataract, bilateral: Secondary | ICD-10-CM | POA: Diagnosis not present

## 2023-01-27 IMAGING — MG MM DIGITAL SCREENING BILAT W/ TOMO AND CAD
6 of 10 series · 6 of 30 positions shown · non-contrast
Comparison: Previous exam(s).

CLINICAL DATA: Screening.

EXAM:
DIGITAL SCREENING BILATERAL MAMMOGRAM WITH TOMOSYNTHESIS AND CAD
TECHNIQUE: Bilateral screening digital craniocaudal and mediolateral oblique
mammograms were obtained. Bilateral screening digital breast
tomosynthesis was performed. The images were evaluated with
computer-aided detection.

[R CC synth-2D (1 of 2)]
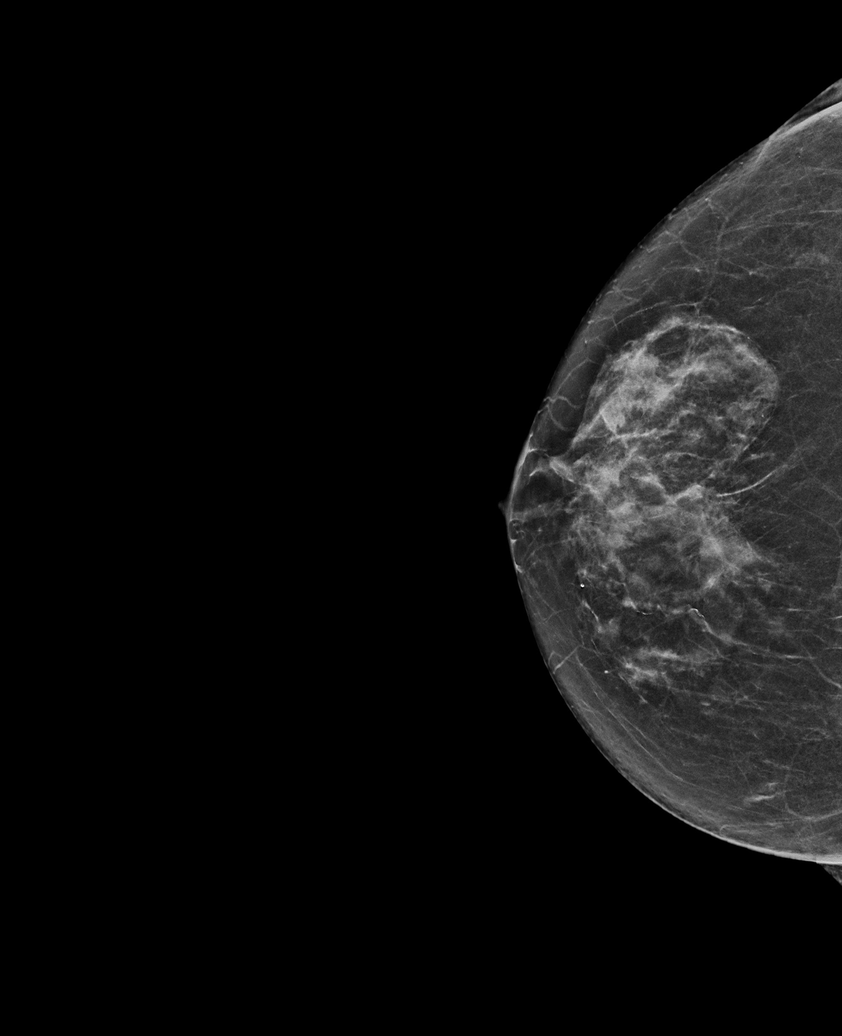

[L CC synth-2D]
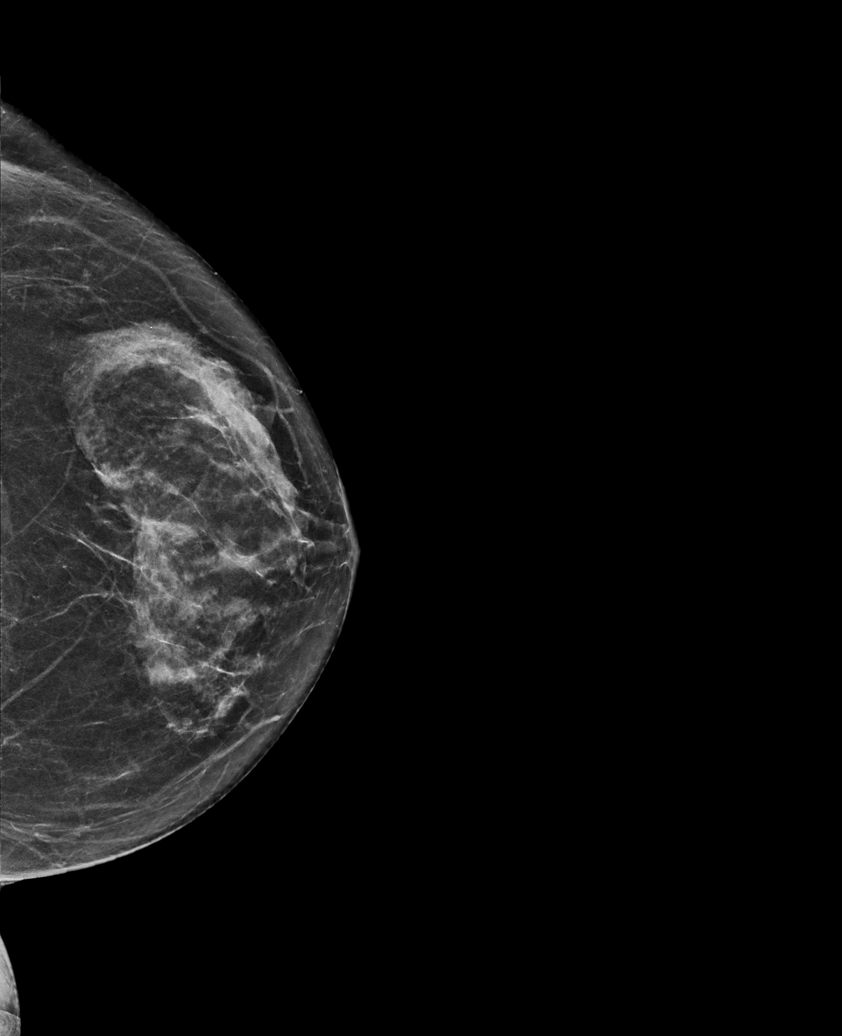

[L MLO synth-2D]
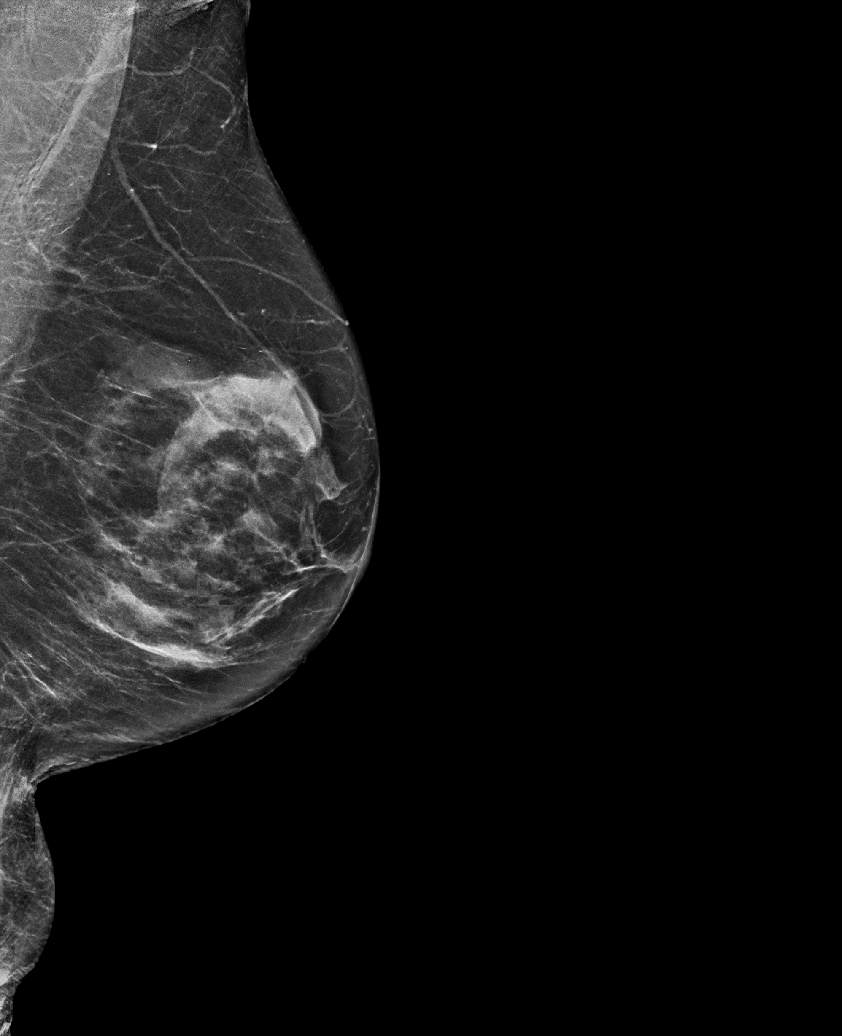

[R CC synth-2D (2 of 2)]
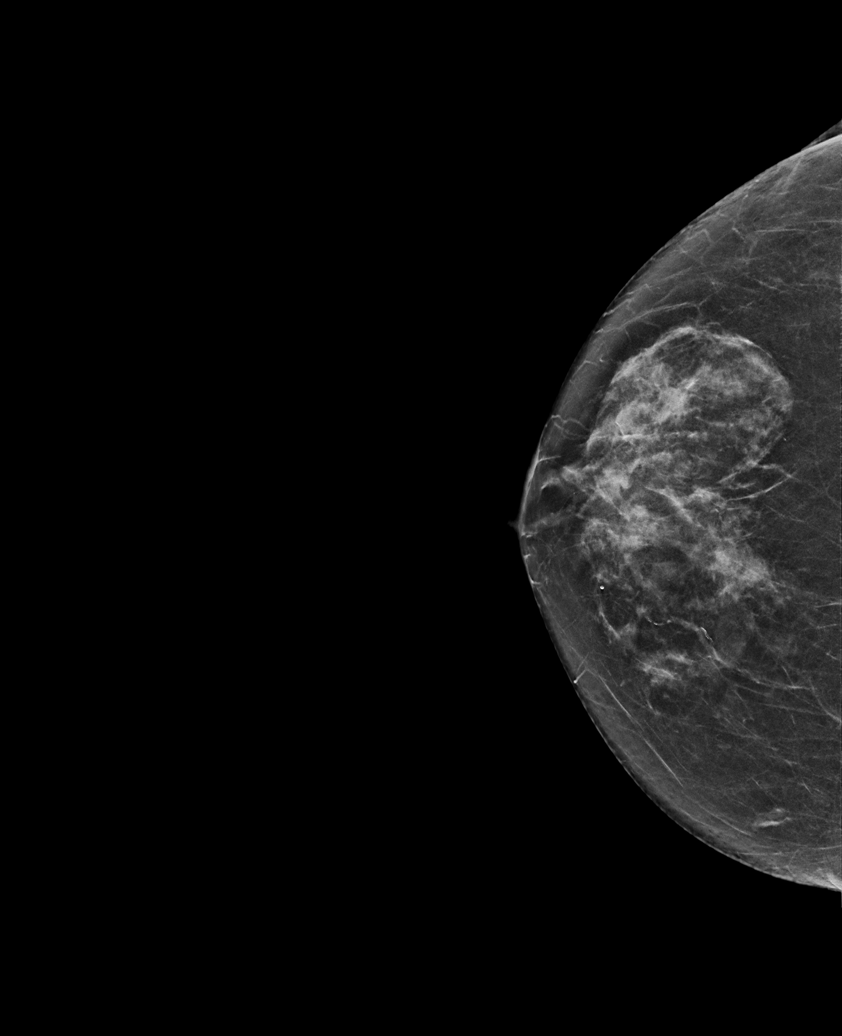

[R MLO synth-2D]
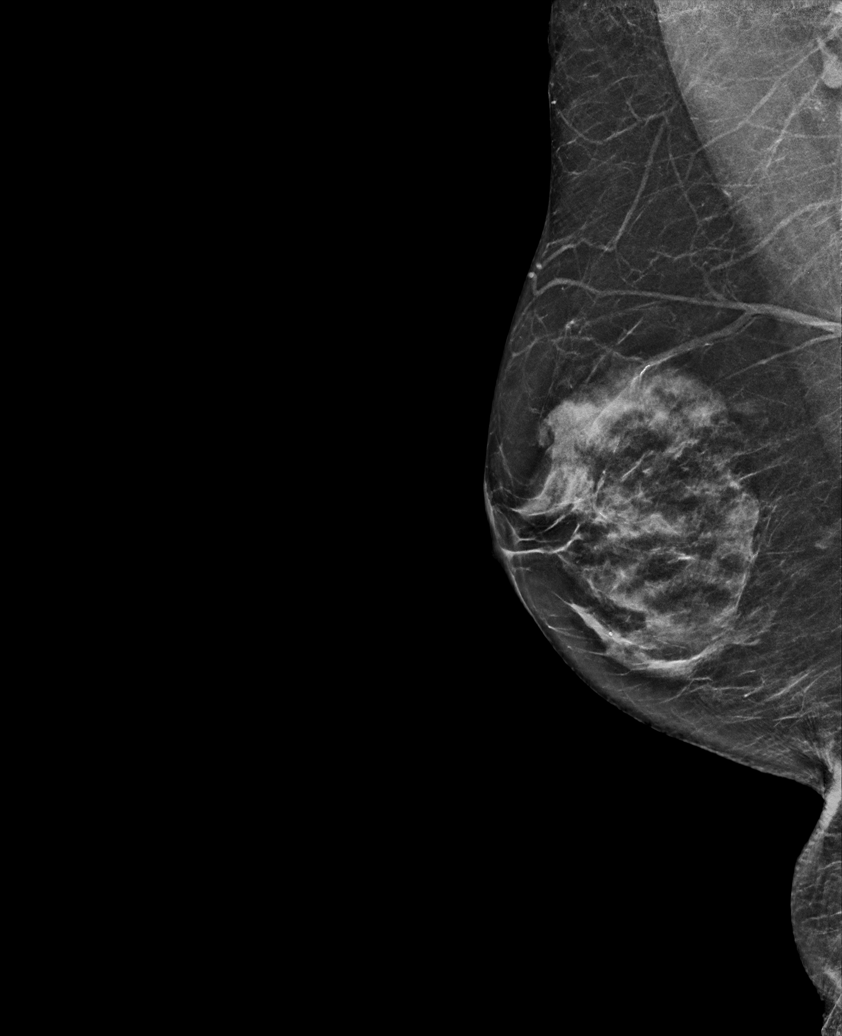

[R MLO tomo · tomo slice 30/59.0]
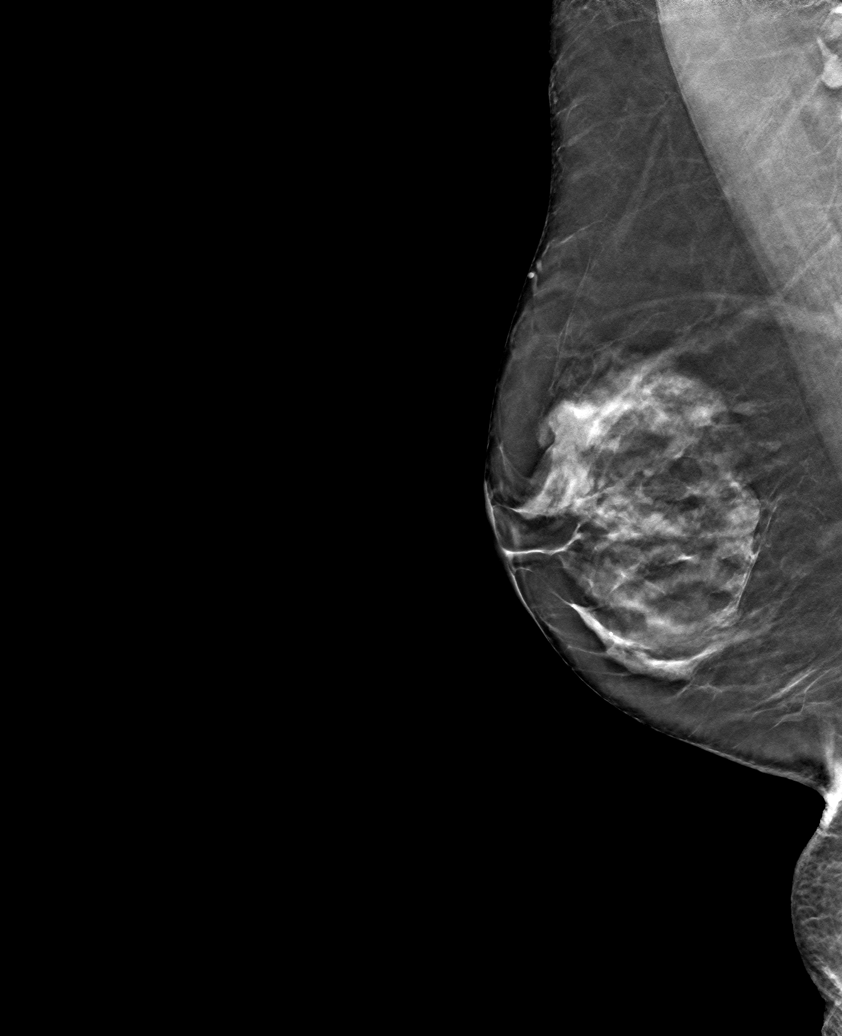

[6 of 30 positions shown; findings below may reference images not displayed]

ACR Breast Density Category c: The breast tissue is heterogeneously
dense, which may obscure small masses.
FINDINGS: There are no findings suspicious for malignancy.
IMPRESSION: No mammographic evidence of malignancy. A result letter of this
screening mammogram will be mailed directly to the patient.

RECOMMENDATION:
Screening mammogram in one year. (Code:Q3-W-BC3)

BI-RADS CATEGORY  1: Negative.

## 2023-01-31 ENCOUNTER — Other Ambulatory Visit: Payer: Self-pay | Admitting: Physician Assistant

## 2023-01-31 DIAGNOSIS — I739 Peripheral vascular disease, unspecified: Secondary | ICD-10-CM

## 2023-02-26 DIAGNOSIS — Z0100 Encounter for examination of eyes and vision without abnormal findings: Secondary | ICD-10-CM | POA: Diagnosis not present

## 2023-03-21 DIAGNOSIS — R69 Illness, unspecified: Secondary | ICD-10-CM | POA: Diagnosis not present

## 2023-04-15 ENCOUNTER — Encounter: Payer: Self-pay | Admitting: Oncology

## 2023-05-30 ENCOUNTER — Other Ambulatory Visit: Payer: Self-pay | Admitting: Internal Medicine

## 2023-05-30 NOTE — Telephone Encounter (Unsigned)
Copied from CRM [id number]. Topic: Clinical - Medication Refill >> May 30, 2023  1:44 PM Larwance Sachs wrote: Most Recent Primary Care Visit:  Provider: Tillman Abide I  Department: [facility]  Visit Type: PHYSICAL  Date: 08/28/2022  Medication: omeprazole (PRILOSEC) 40 MG capsule   Has the patient contacted their pharmacy? Yes, needs new prescription  (Agent: If no, request that the patient contact the pharmacy for the refill. If patient does not wish to contact the pharmacy document the reason why and proceed with request.) (Agent: If yes, when and what did the pharmacy advise?)  Is this the correct pharmacy for this prescription? Yes If no, delete pharmacy and type the correct one.  This is the patient's preferred pharmacy:   [id number] [place], [place] - [address] [place] [address] Phone: [phone] Fax: [phone]   Has the prescription been filled recently? No  Is the patient out of the medication? Yes  Has the patient been seen for an appointment in the last year OR does the patient have an upcoming appointment? Yes  Can we respond through MyChart? Yes  Agent: Please be advised that Rx refills may take up to 3 business days. We ask that you follow-up with your pharmacy.

## 2023-05-31 MED ORDER — OMEPRAZOLE 40 MG PO CPDR: 40.0000 mg | DELAYED_RELEASE_CAPSULE | Freq: Every day | ORAL | 3 refills | Status: DC

## 2023-06-12 ENCOUNTER — Encounter: Payer: Self-pay | Admitting: Oncology

## 2023-08-12 ENCOUNTER — Other Ambulatory Visit: Payer: Self-pay | Admitting: Internal Medicine

## 2023-08-29 ENCOUNTER — Encounter: Payer: Self-pay | Admitting: Oncology

## 2023-08-29 ENCOUNTER — Telehealth: Payer: Self-pay | Admitting: Internal Medicine

## 2023-08-29 ENCOUNTER — Ambulatory Visit: Payer: Self-pay | Admitting: Internal Medicine

## 2023-08-29 ENCOUNTER — Ambulatory Visit: Payer: Medicare HMO | Admitting: Internal Medicine

## 2023-08-29 ENCOUNTER — Encounter: Payer: Self-pay | Admitting: Internal Medicine

## 2023-08-29 VITALS — BP 132/76 | HR 77 | Temp 98.5°F | Ht 64.25 in | Wt 129.0 lb

## 2023-08-29 DIAGNOSIS — I10 Essential (primary) hypertension: Secondary | ICD-10-CM | POA: Diagnosis not present

## 2023-08-29 DIAGNOSIS — Z0000 Encounter for general adult medical examination without abnormal findings: Secondary | ICD-10-CM

## 2023-08-29 DIAGNOSIS — N1831 Chronic kidney disease, stage 3a: Secondary | ICD-10-CM | POA: Diagnosis not present

## 2023-08-29 DIAGNOSIS — Z1159 Encounter for screening for other viral diseases: Secondary | ICD-10-CM | POA: Diagnosis not present

## 2023-08-29 DIAGNOSIS — I739 Peripheral vascular disease, unspecified: Secondary | ICD-10-CM

## 2023-08-29 DIAGNOSIS — N2581 Secondary hyperparathyroidism of renal origin: Secondary | ICD-10-CM | POA: Diagnosis not present

## 2023-08-29 DIAGNOSIS — F39 Unspecified mood [affective] disorder: Secondary | ICD-10-CM | POA: Diagnosis not present

## 2023-08-29 LAB — CBC
HCT: 29.5 % — ABNORMAL LOW (ref 36.0–46.0)
Hemoglobin: 8.9 g/dL — ABNORMAL LOW (ref 12.0–15.0)
MCHC: 30.2 g/dL (ref 30.0–36.0)
MCV: 66.8 fl — ABNORMAL LOW (ref 78.0–100.0)
Platelets: 373 10*3/uL (ref 150.0–400.0)
RBC: 4.41 Mil/uL (ref 3.87–5.11)
RDW: 19.8 % — ABNORMAL HIGH (ref 11.5–15.5)
WBC: 7.1 10*3/uL (ref 4.0–10.5)

## 2023-08-29 LAB — LIPID PANEL
Cholesterol: 151 mg/dL (ref 0–200)
HDL: 61.6 mg/dL (ref >39.00)
LDL Cholesterol: 74 mg/dL (ref 0–99)
NonHDL: 89.58
Total CHOL/HDL Ratio: 2
Triglycerides: 80 mg/dL (ref 0.0–149.0)
VLDL: 16 mg/dL (ref 0.0–40.0)

## 2023-08-29 LAB — RENAL FUNCTION PANEL
Albumin: 4.4 g/dL (ref 3.5–5.2)
BUN: 24 mg/dL — ABNORMAL HIGH (ref 6–23)
CO2: 28 meq/L (ref 19–32)
Calcium: 9.9 mg/dL (ref 8.4–10.5)
Chloride: 102 meq/L (ref 96–112)
Creatinine, Ser: 1.09 mg/dL (ref 0.40–1.20)
GFR: 48.59 mL/min — ABNORMAL LOW (ref >60.00)
Glucose, Bld: 102 mg/dL — ABNORMAL HIGH (ref 70–99)
Phosphorus: 3.2 mg/dL (ref 2.3–4.6)
Potassium: 3.7 meq/L (ref 3.5–5.1)
Sodium: 140 meq/L (ref 135–145)

## 2023-08-29 LAB — TSH: TSH: 1.37 u[IU]/mL (ref 0.35–5.50)

## 2023-08-29 LAB — HEPATIC FUNCTION PANEL
ALT: 20 U/L (ref 0–35)
AST: 24 U/L (ref 0–37)
Albumin: 4.4 g/dL (ref 3.5–5.2)
Alkaline Phosphatase: 97 U/L (ref 39–117)
Bilirubin, Direct: 0.1 mg/dL (ref 0.0–0.3)
Total Bilirubin: 0.7 mg/dL (ref 0.2–1.2)
Total Protein: 6.8 g/dL (ref 6.0–8.3)

## 2023-08-29 LAB — VITAMIN B12: Vitamin B-12: 430 pg/mL (ref 211–911)

## 2023-08-29 LAB — VITAMIN D 25 HYDROXY (VIT D DEFICIENCY, FRACTURES): VITD: 57.86 ng/mL (ref 30.00–100.00)

## 2023-08-29 MED ORDER — VALSARTAN 40 MG PO TABS: 40.0000 mg | ORAL_TABLET | Freq: Every day | ORAL | 3 refills | Status: DC

## 2023-08-29 NOTE — Assessment & Plan Note (Signed)
 Mostly reactive issues with dealing with brother's care No meds indicated

## 2023-08-29 NOTE — Telephone Encounter (Signed)
 During check out, pt mentioned since Dr. Joelle Musca is retiring soon, she'd like to become a pt of Dr. Tullo's, if possible. Pt states she attends the same church as Dr. Madelon Scheuermann. Pt's next appt is set for next year's cpe. Is this TOC okay? Please advise. Call back # [phone].

## 2023-08-29 NOTE — Assessment & Plan Note (Signed)
 Stable mild symptoms with walking

## 2023-08-29 NOTE — Assessment & Plan Note (Signed)
 BP Readings from Last 3 Encounters:  08/29/23 132/76  11/29/22 110/74  11/06/22 (!) 145/80   Controlled on maxzide 25

## 2023-08-29 NOTE — Assessment & Plan Note (Signed)
 I have personally reviewed the Medicare Annual Wellness questionnaire and have noted 1. The patient's medical and social history 2. Their use of alcohol, tobacco or illicit drugs 3. Their current medications and supplements 4. The patient's functional ability including ADL's, fall risks, home safety risks and hearing or visual             impairment. 5. Diet and physical activities 6. Evidence for depression or mood disorders  The patients weight, height, BMI and visual acuity have been recorded in the chart I have made referrals, counseling and provided education to the patient based review of the above and I have provided the pt with a written personalized care plan for preventive services.  I have provided you with a copy of your personalized plan for preventive services. Please take the time to review along with your updated medication list.  Done with screening colonoscopies Consider one last mammogram Discussed increasing exercise Flu and RSV in the fall Prefers no COVID vaccines

## 2023-08-29 NOTE — Assessment & Plan Note (Signed)
 Past elevated PTH --better on vitamin D 

## 2023-08-29 NOTE — Progress Notes (Signed)
 Hearing Screening - Comments:: Passed whisper test Vision Screening - Comments:: November 2024

## 2023-08-29 NOTE — Progress Notes (Signed)
 Subjective:    Patient ID: Meghan Moody, female    DOB: 05-Apr-1946, 78 y.o.   MRN: [id number]  HPI Here for Medicare wellness visit and follow up of chronic health conditions Reviewed advanced directives Reviewed other doctors---Dr Roney--eye doctor, Mr Garen Juneau, Dr Shonna Downy, Dr Gollan--cardiology, Dr Courtland Ditch cardiology, Dr Maia Schwartz No hospitalizations or surgery this year Not much exercise--just walks dog (less than a mile mostly--but occ longer) Vision is fuzzy at times--like looking away after reading (then recovers) Hearing is okay in right ear only No alcohol or tobacco No falls Not anhedonic---gets together with family weekly Independent with instrumental ADLs No memory issues  Feels she may be anemic again Energy levels are down Harder to even walk dog  No SOB Does get sense of chest pressure--anxiety feeling (same as with last anemia) No palpitations No edema Gets tired feeling in legs/heaviness with walking No dizziness or syncope  Reviewed labs GFR running 48-55 in past years Past elevated PTH  Still stress with brother More tired than depressed Was recently hospitalized and now in [facility] for SNF Won't be able to go back to [place]  Current Outpatient Medications on File Prior to Visit  Medication Sig Dispense Refill   acetaminophen  (TYLENOL ) 500 MG tablet Take 500 mg by mouth every 8 (eight) hours as needed.     Ascorbic Acid (VITAMIN C) 1000 MG tablet Take 1,000 mg by mouth daily.     aspirin  EC 81 MG tablet Take 1 tablet (81 mg total) by mouth daily. Swallow whole.     atorvastatin  (LIPITOR) 40 MG tablet Take 1 tablet (40 mg total) by mouth daily. 90 tablet 3   Cholecalciferol (VITAMIN D ) 125 MCG (5000 UT) CAPS Take by mouth daily.     cilostazol  (PLETAL ) 50 MG tablet Take 1 tablet (50 mg total) by mouth 2 (two) times daily. 180 tablet 3   meloxicam (MOBIC) 15 MG tablet Take 15 mg by mouth daily.     Multiple Vitamin  (MULTIVITAMIN) tablet Take 1 tablet by mouth daily.     omeprazole  (PRILOSEC ) 40 MG capsule TAKE 1 CAPSULE (40 MG TOTAL) BY MOUTH DAILY. TAKE 1 CAPSULE BY MOUTH EVERY DAY 90 capsule 0   OVER THE COUNTER MEDICATION Toe Fungus Medication     RESTASIS 0.05 % ophthalmic emulsion 1 drop 2 (two) times daily.     triamterene -hydrochlorothiazide (MAXZIDE-25) 37.5-25 MG tablet TAKE 1/2 TABLET BY MOUTH DAILY 45 tablet 3   No current facility-administered medications on file prior to visit.    No Known Allergies  Past Medical History:  Diagnosis Date   Anemia    Chronic bronchitis (HCC)    Chronic cough    Deafness in left ear    GERD (gastroesophageal reflux disease)    H/O hemorrhoidectomy    Hiatal hernia    Hyperlipidemia    Hypertension    SOB (shortness of breath) on exertion     Past Surgical History:  Procedure Laterality Date   BREAST CYST ASPIRATION Bilateral    neg   BREAST EXCISIONAL BIOPSY Right 1978   neg   BUNIONECTOMY     CATARACT EXTRACTION W/PHACO Right 03/30/2019   Procedure: CATARACT EXTRACTION PHACO AND INTRAOCULAR LENS PLACEMENT (IOC) RIGHT, 1.23, 00:20.8;  Surgeon: Rosa College, MD;  Location: [facility];  Service: Ophthalmology;  Laterality: Right;   CATARACT EXTRACTION W/PHACO Left 04/27/2019   Procedure: CATARACT EXTRACTION PHACO AND INTRAOCULAR LENS PLACEMENT (IOC) LEFT 1.46  00:20.3;  Surgeon: Dusty Gin  Lavonia Powers, MD;  Location: [facility];  Service: Ophthalmology;  Laterality: Left;   COLONOSCOPY WITH PROPOFOL  N/A 11/02/2019   Procedure: COLONOSCOPY WITH PROPOFOL ;  Surgeon: Selena Daily, MD;  Location: [facility] ENDOSCOPY;  Service: Gastroenterology;  Laterality: N/A;   COLONOSCOPY WITH PROPOFOL  N/A 08/01/2021   Procedure: COLONOSCOPY WITH PROPOFOL ;  Surgeon: Selena Daily, MD;  Location: [facility] ENDOSCOPY;  Service: Gastroenterology;  Laterality: N/A;   ESOPHAGOGASTRODUODENOSCOPY (EGD) WITH PROPOFOL  N/A 11/02/2019   Procedure:  ESOPHAGOGASTRODUODENOSCOPY (EGD) WITH PROPOFOL ;  Surgeon: Selena Daily, MD;  Location: [facility] ENDOSCOPY;  Service: Gastroenterology;  Laterality: N/A;   ESOPHAGOGASTRODUODENOSCOPY (EGD) WITH PROPOFOL  N/A 08/01/2021   Procedure: ESOPHAGOGASTRODUODENOSCOPY (EGD) WITH PROPOFOL ;  Surgeon: Selena Daily, MD;  Location: [facility] ENDOSCOPY;  Service: Gastroenterology;  Laterality: N/A;  with Capsule placement   GIVENS CAPSULE STUDY N/A 08/01/2021   Procedure: GIVENS CAPSULE STUDY;  Surgeon: Selena Daily, MD;  Location: [facility] ENDOSCOPY;  Service: Gastroenterology;  Laterality: N/A;   TUBAL LIGATION      Family History  Problem Relation Age of Onset   Coronary artery disease Mother    Heart disease Mother    Hypertension Mother    Stroke Father    Drug abuse Brother    Stroke Brother     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  Tobacco Use   Smoking status: Former    Current packs/day: 0.00    Average packs/day: 0.3 packs/day for 40.0 years (12.0 ttl pk-yrs)    Types: Cigarettes    Start date: 04/16/1966    Quit date: 04/16/2006    Years since quitting: 17.3    Passive exposure: Current   Smokeless tobacco: Never  Vaping Use   Vaping status: Never Used  Substance and Sexual Activity   Alcohol use: No   Drug use: No   Sexual activity: Not on file  Other Topics Concern   Not on file  Social History Narrative   Widowed, 1 son   New relationship since 2013      Has living will   Son Margaretmary Shaver is health care POA   Would accept resuscitation attempts but no prolonged ventilation.   Would probably not accept tube feeds   Social Drivers of Corporate investment banker Strain: Not on file  Food Insecurity: Not on file  Transportation Needs: Not on file   Physical Activity: Not on file  Stress: Not on file  Social Connections: Not on file  Intimate Partner Violence: Not on file   Review of Systems Appetite is okay Weight is down a bit-- 8-10# this year Hard time maintaining sleep after nocturia. Some daytime tiredness Wears seat belt Teeth are okay--keeps up with dentist No heartburn unless she misses omeprazole --takes daily. No dysphagia Bowels move fine--no blood No urinary issues--other than mild urge incontinence Some back pain--only occ tylenol  No suspicious skin lesion     Objective:   Physical Exam Constitutional:      Appearance: Normal appearance.  HENT:     Mouth/Throat:     Pharynx: No oropharyngeal exudate or posterior oropharyngeal erythema.  Eyes:     Conjunctiva/sclera: Conjunctivae normal.     Pupils: Pupils are equal, round, and reactive to light.  Cardiovascular:  Rate and Rhythm: Normal rate and regular rhythm.     Heart sounds: No murmur heard.    No gallop.     Comments: 1+ pedal pulses Pulmonary:     Effort: Pulmonary effort is normal.     Breath sounds: Normal breath sounds. No wheezing or rales.  Abdominal:     Palpations: Abdomen is soft.     Tenderness: There is no abdominal tenderness.  Musculoskeletal:     Cervical back: Neck supple.     Right lower leg: No edema.     Left lower leg: No edema.  Lymphadenopathy:     Cervical: No cervical adenopathy.  Skin:    Findings: No rash.  Neurological:     General: No focal deficit present.     Mental Status: She is alert and oriented to person, place, and time.     Comments: Mini-cog---normal  Psychiatric:        Mood and Affect: Mood normal.        Behavior: Behavior normal.            Assessment & Plan:

## 2023-08-29 NOTE — Assessment & Plan Note (Signed)
 Stable mild decrease Will add valsartan 40mg  daily

## 2023-08-30 ENCOUNTER — Telehealth: Payer: Self-pay | Admitting: Oncology

## 2023-08-30 DIAGNOSIS — D509 Iron deficiency anemia, unspecified: Secondary | ICD-10-CM

## 2023-08-30 NOTE — Addendum Note (Signed)
 Addended by: COOKE, Errin Chewning E on: 08/30/2023 01:17 PM   Modules accepted: Orders

## 2023-08-30 NOTE — Telephone Encounter (Signed)
 Lab orders entered

## 2023-08-30 NOTE — Telephone Encounter (Signed)
 Pt left a vm stating that she wants to be seen again by Dr.Finnegan. states that she had labs done at her pcp and the labs show that she is anemic again and experiencing the same symptoms as before.   Please advise on scheduling.   The last los from 11/06/2022 stated no follow up necessary.

## 2023-08-31 LAB — PARATHYROID HORMONE, INTACT (NO CA): PTH: 28 pg/mL (ref 16–77)

## 2023-08-31 LAB — HEPATITIS C ANTIBODY: Hepatitis C Ab: NONREACTIVE

## 2023-09-04 ENCOUNTER — Inpatient Hospital Stay: Attending: Oncology

## 2023-09-04 DIAGNOSIS — D509 Iron deficiency anemia, unspecified: Secondary | ICD-10-CM | POA: Insufficient documentation

## 2023-09-04 DIAGNOSIS — Z79899 Other long term (current) drug therapy: Secondary | ICD-10-CM | POA: Diagnosis not present

## 2023-09-04 LAB — CBC WITH DIFFERENTIAL (CANCER CENTER ONLY)
Abs Immature Granulocytes: 0.03 10*3/uL (ref 0.00–0.07)
Basophils Absolute: 0.1 10*3/uL (ref 0.0–0.1)
Basophils Relative: 1 %
Eosinophils Absolute: 0.2 10*3/uL (ref 0.0–0.5)
Eosinophils Relative: 3 %
HCT: 28.5 % — ABNORMAL LOW (ref 36.0–46.0)
Hemoglobin: 8.3 g/dL — ABNORMAL LOW (ref 12.0–15.0)
Immature Granulocytes: 1 %
Lymphocytes Relative: 19 %
Lymphs Abs: 1 10*3/uL (ref 0.7–4.0)
MCH: 20.3 pg — ABNORMAL LOW (ref 26.0–34.0)
MCHC: 29.1 g/dL — ABNORMAL LOW (ref 30.0–36.0)
MCV: 69.7 fL — ABNORMAL LOW (ref 80.0–100.0)
Monocytes Absolute: 0.4 10*3/uL (ref 0.1–1.0)
Monocytes Relative: 8 %
Neutro Abs: 3.8 10*3/uL (ref 1.7–7.7)
Neutrophils Relative %: 68 %
Platelet Count: 399 10*3/uL (ref 150–400)
RBC: 4.09 MIL/uL (ref 3.87–5.11)
RDW: 19.4 % — ABNORMAL HIGH (ref 11.5–15.5)
WBC Count: 5.6 10*3/uL (ref 4.0–10.5)
nRBC: 0 % (ref 0.0–0.2)

## 2023-09-04 LAB — IRON AND TIBC
Iron: 18 ug/dL — ABNORMAL LOW (ref 28–170)
Saturation Ratios: 3 % — ABNORMAL LOW (ref 10.4–31.8)
TIBC: 546 ug/dL — ABNORMAL HIGH (ref 250–450)
UIBC: 528 ug/dL

## 2023-09-04 LAB — FERRITIN: Ferritin: 3 ng/mL — ABNORMAL LOW (ref 11–307)

## 2023-09-05 ENCOUNTER — Inpatient Hospital Stay

## 2023-09-05 ENCOUNTER — Inpatient Hospital Stay (HOSPITAL_BASED_OUTPATIENT_CLINIC_OR_DEPARTMENT_OTHER): Admitting: Oncology

## 2023-09-05 ENCOUNTER — Encounter: Payer: Self-pay | Admitting: Oncology

## 2023-09-05 VITALS — BP 123/53 | HR 85 | Temp 98.3°F | Resp 16 | Ht 64.25 in | Wt 130.4 lb

## 2023-09-05 VITALS — BP 119/62 | HR 75

## 2023-09-05 DIAGNOSIS — D509 Iron deficiency anemia, unspecified: Secondary | ICD-10-CM

## 2023-09-05 MED ORDER — IRON SUCROSE 20 MG/ML IV SOLN
200.0000 mg | Freq: Once | INTRAVENOUS | Status: AC
  Administered 2023-09-05: 200 mg via INTRAVENOUS
  Filled 2023-09-05: qty 10

## 2023-09-05 NOTE — Progress Notes (Signed)
 [facility]  [phone] [phone]  ID: Meghan Moody OB: May 17, 1945  MR#: [id number]  QIO#:[id number]  Patient Care Team: Helaine Llanos, MD as PCP - General Wenona Hamilton, MD as PCP - Cardiology (Cardiology) Helaine Llanos, MD as Referring Physician (Pediatrics) Shellie Dials, MD as Consulting Physician (Oncology) Candyce Champagne, MD as Consulting Physician (General Surgery) Selena Daily, MD as Consulting Physician (Gastroenterology) Wenona Hamilton, MD as Consulting Physician (Cardiology)  CHIEF COMPLAINT: Iron  deficiency anemia.  INTERVAL HISTORY: Patient last evaluated in clinic in July 2024.  She returns today with increasing weakness and fatigue.  Recent laboratory work noted declining hemoglobin and iron  stores.  She otherwise feels well.  She has no neurologic complaints.  She denies any recent fevers or illnesses.  She has a good appetite and denies weight loss.  She denies any chest pain, shortness of breath, cough, or hemoptysis.  She denies any nausea, vomiting, constipation, or diarrhea.  She has no melena or hematochezia.  She has no urinary complaints.  Patient offers no further specific complaints today.  REVIEW OF SYSTEMS:   Review of Systems  Constitutional:  Positive for malaise/fatigue. Negative for fever and weight loss.  Respiratory: Negative.  Negative for cough, hemoptysis and shortness of breath.   Cardiovascular: Negative.  Negative for chest pain and leg swelling.  Gastrointestinal: Negative.  Negative for abdominal pain, blood in stool and melena.  Genitourinary: Negative.  Negative for hematuria.  Musculoskeletal: Negative.  Negative for back pain.  Skin: Negative.  Negative for rash.  Neurological:  Positive for weakness. Negative for dizziness, focal weakness and headaches.  Psychiatric/Behavioral: Negative.  The patient is not nervous/anxious.     As per HPI. Otherwise, a complete review of  systems is negative.  PAST MEDICAL HISTORY: Past Medical History:  Diagnosis Date   Anemia    Chronic bronchitis (HCC)    Chronic cough    Deafness in left ear    GERD (gastroesophageal reflux disease)    H/O hemorrhoidectomy    Hiatal hernia    Hyperlipidemia    Hypertension    SOB (shortness of breath) on exertion     PAST SURGICAL HISTORY: Past Surgical History:  Procedure Laterality Date   BREAST CYST ASPIRATION Bilateral    neg   BREAST EXCISIONAL BIOPSY Right 1978   neg   BUNIONECTOMY     CATARACT EXTRACTION W/PHACO Right 03/30/2019   Procedure: CATARACT EXTRACTION PHACO AND INTRAOCULAR LENS PLACEMENT (IOC) RIGHT, 1.23, 00:20.8;  Surgeon: Rosa College, MD;  Location: [facility];  Service: Ophthalmology;  Laterality: Right;   CATARACT EXTRACTION W/PHACO Left 04/27/2019   Procedure: CATARACT EXTRACTION PHACO AND INTRAOCULAR LENS PLACEMENT (IOC) LEFT 1.46  00:20.3;  Surgeon: Rosa College, MD;  Location: [facility];  Service: Ophthalmology;  Laterality: Left;   COLONOSCOPY WITH PROPOFOL  N/A 11/02/2019   Procedure: COLONOSCOPY WITH PROPOFOL ;  Surgeon: Selena Daily, MD;  Location: [facility] END[facility]COPY;  Service: Gastroenterology;  Laterality: N/A;   COLONOSCOPY WITH PROPOFOL  N/A 08/01/2021   Procedure: COLONOSCOPY WITH PROPOFOL ;  Surgeon: Selena Daily, MD;  Location: [facility] ENDOSCOPY;  Service: Gastroenterology;  Laterality: N/A;   ESOPHAGOGASTRODUODENOSCOPY (EGD) WITH PROPOFOL  N/A 11/02/2019   Procedure: ESOPHAGOGASTRODUODENOSCOPY (EGD) WITH PROPOFOL ;  Surgeon: Selena Daily, MD;  Location: [facility] ENDOSCOPY;  Service: Gastroenterology;  Laterality: N/A;   ESOPHAGOGASTRODUODENOSCOPY (EGD) WITH PROPOFOL  N/A 08/01/2021   Procedure: ESOPHAGOGASTRODUODENOSCOPY (EGD) WITH PROPOFOL ;  Surgeon: Selena Daily, MD;  Location:  [facility] ENDOSCOPY;  Service: Gastroenterology;  Laterality: N/A;  with Capsule placement   GIVENS CAPSULE STUDY N/A 08/01/2021    Procedure: GIVENS CAPSULE STUDY;  Surgeon: Selena Daily, MD;  Location: [facility] END[facility]COPY;  Service: Gastroenterology;  Laterality: N/A;   TUBAL LIGATION      FAMILY HISTORY: Family History  Problem Relation Age of Onset   Coronary artery disease Mother    Heart disease Mother    Hypertension Mother    Stroke Father    Drug abuse Brother    Stroke Brother     ADVANCED DIRECTIVES (Y/N):  N  HEALTH MAINTENANCE: Social History   Tobacco Use   Smoking status: Former    Current packs/day: 0.00    Average packs/day: 0.3 packs/day for 40.0 years (12.0 ttl pk-yrs)    Types: Cigarettes    Start date: 04/16/1966    Quit date: 04/16/2006    Years since quitting: 17.4    Passive exposure: Current   Smokeless tobacco: Never  Vaping Use   Vaping status: Never Used  Substance Use Topics   Alcohol use: No   Drug use: No     Colonoscopy:  PAP:  Bone density:  Lipid panel:  No Known Allergies  Current Outpatient Medications  Medication Sig Dispense Refill   acetaminophen  (TYLENOL ) 500 MG tablet Take 500 mg by mouth every 8 (eight) hours as needed.     aspirin  EC 81 MG tablet Take 1 tablet (81 mg total) by mouth daily. Swallow whole.     atorvastatin  (LIPITOR) 40 MG tablet Take 1 tablet (40 mg total) by mouth daily. 90 tablet 3   cilostazol  (PLETAL ) 50 MG tablet Take 1 tablet (50 mg total) by mouth 2 (two) times daily. 180 tablet 3   RESTASIS 0.05 % ophthalmic emulsion 1 drop 2 (two) times daily.     triamterene -hydrochlorothiazide (MAXZIDE-25) 37.5-25 MG tablet TAKE 1/2 TABLET BY MOUTH DAILY 45 tablet 3   valsartan  (DIOVAN ) 40 MG tablet Take 1 tablet (40 mg total) by mouth daily. 90 tablet 3   No current facility-administered medications for this visit.    OBJECTIVE: Vitals:   09/05/23 1519  BP: (!) 123/53  Pulse: 85  Resp: 16  Temp: 98.3 F (36.8 C)  SpO2: 98%      Body mass index is 22.21 kg/m.    ECOG FS:0 - Asymptomatic  General: Well-developed, well-nourished,  no acute distress. Eyes: Pink conjunctiva, anicteric sclera. HEENT: Normocephalic, moist mucous membranes. Lungs: No audible wheezing or coughing. Heart: Regular rate and rhythm. Abdomen: Soft, nontender, no obvious distention. Musculoskeletal: No edema, cyanosis, or clubbing. Neuro: Alert, answering all questions appropriately. Cranial nerves grossly intact. Skin: No rashes or petechiae noted. Psych: Normal affect.  LAB RESULTS:  Lab Results  Component Value Date   NA 140 08/29/2023   K 3.7 08/29/2023   CL 102 08/29/2023   CO2 28 08/29/2023   GLUCOSE 102 (H) 08/29/2023   BUN 24 (H) 08/29/2023   CREATININE 1.09 08/29/2023   CALCIUM  9.9 08/29/2023   PROT 6.8 08/29/2023   ALBUMIN 4.4 08/29/2023   ALBUMIN 4.4 08/29/2023   AST 24 08/29/2023   ALT 20 08/29/2023   ALKPHOS 97 08/29/2023   BILITOT 0.7 08/29/2023   GFRNONAA 55 (L) 11/02/2022   GFRAA 49 (L) 08/29/2015    Lab Results  Component Value Date   WBC 5.6 09/04/2023   NEUTROABS 3.8 09/04/2023   HGB 8.3 (L) 09/04/2023   HCT 28.5 (L) 09/04/2023   MCV 69.7 (  L) 09/04/2023   PLT 399 09/04/2023   Lab Results  Component Value Date   IRON  18 (L) 09/04/2023   TIBC 546 (H) 09/04/2023   IRONPCTSAT 3 (L) 09/04/2023   Lab Results  Component Value Date   FERRITIN 3 (L) 09/04/2023     STUDIES: No results found.  ASSESSMENT: Iron  deficiency anemia.  PLAN:    Iron  deficiency anemia: Patient's hemoglobin and iron  stores have significantly dropped and she is also symptomatic.  Her most recent luminal evaluation on August 01, 2021 with no evidence of active bleeding and no significant pathology reported.  Proceed with 200 mg IV Venofer  today.  Return to clinic 4 times over the next 1 to 2 weeks for additional treatment.  Patient will then return to clinic in 4 months with repeat laboratory work, further evaluation, and continuation of treatment if needed.    I spent a total of 30 minutes reviewing chart data, face-to-face  evaluation with the patient, counseling and coordination of care as detailed above.    Patient expressed understanding and was in agreement with this plan. She also understands that She can call clinic at any time with any questions, concerns, or complaints.     Shellie Dials, MD   09/05/2023 4:39 PM

## 2023-09-05 NOTE — Progress Notes (Signed)
 Feeling very fatigue and "wiped out".  States she has been very thirsty lately and craves ice.

## 2023-09-12 ENCOUNTER — Inpatient Hospital Stay

## 2023-09-12 VITALS — BP 97/52 | HR 78 | Temp 97.9°F | Resp 16

## 2023-09-12 DIAGNOSIS — D509 Iron deficiency anemia, unspecified: Secondary | ICD-10-CM | POA: Diagnosis not present

## 2023-09-12 MED ORDER — SODIUM CHLORIDE 0.9% FLUSH
10.0000 mL | Freq: Once | INTRAVENOUS | Status: AC | PRN
  Administered 2023-09-12: 10 mL
  Filled 2023-09-12: qty 10

## 2023-09-12 MED ORDER — IRON SUCROSE 20 MG/ML IV SOLN
200.0000 mg | Freq: Once | INTRAVENOUS | Status: AC
  Administered 2023-09-12: 200 mg via INTRAVENOUS
  Filled 2023-09-12: qty 10

## 2023-09-17 ENCOUNTER — Ambulatory Visit: Payer: Self-pay

## 2023-09-17 ENCOUNTER — Inpatient Hospital Stay: Attending: Oncology

## 2023-09-17 VITALS — BP 96/58 | HR 84 | Temp 97.9°F | Resp 16

## 2023-09-17 DIAGNOSIS — D509 Iron deficiency anemia, unspecified: Secondary | ICD-10-CM | POA: Diagnosis not present

## 2023-09-17 DIAGNOSIS — I959 Hypotension, unspecified: Secondary | ICD-10-CM

## 2023-09-17 MED ORDER — IRON SUCROSE 20 MG/ML IV SOLN
200.0000 mg | Freq: Once | INTRAVENOUS | Status: AC
  Administered 2023-09-17: 200 mg via INTRAVENOUS
  Filled 2023-09-17: qty 10

## 2023-09-17 MED ORDER — SODIUM CHLORIDE 0.9 % IV SOLN
Freq: Once | INTRAVENOUS | Status: AC
  Filled 2023-09-17: qty 250

## 2023-09-17 MED ORDER — SODIUM CHLORIDE 0.9% FLUSH
10.0000 mL | Freq: Once | INTRAVENOUS | Status: AC | PRN
  Administered 2023-09-17: 10 mL
  Filled 2023-09-17: qty 10

## 2023-09-17 NOTE — Telephone Encounter (Signed)
 Pt at infusion clinic at this time. Calling to ask PCP if she should continue taking her Valsartan . Blood Pressure measures at - sitting 93/51  prior to iron  infusion  86/52 after   standing 81/54   Copied from CRM #[id number]. Topic: Clinical - Red Word Triage >> Sep 17, 2023  3:37 PM [place] M wrote: Red Word that prompted transfer to Nurse Triage: Patient is at her iron  infusion appointment right now. She is getting fluids at the infusion right now. . Blood Pressure measures at - sitting 93/51  prior to iron  infusion  86/52 after   standing 81/54 . Was given a new medication valsartan .

## 2023-09-17 NOTE — Patient Instructions (Addendum)
 BP pre iron  infusion:  93/51 (Sitting)  BP post iron  infusion:  86/52 (Sitting)    81/54 (Standing)  Recheck BP:   72/48 (Sitting)    82/52 (Sitting, checked manually)

## 2023-09-17 NOTE — Telephone Encounter (Signed)
 Contacted pt to inform her of pcp advice to hold medication and monitor bp, restart medication if over 140/90. Pt verbalized understanding and has no other questions or concerns.

## 2023-09-17 NOTE — Progress Notes (Signed)
 Patient here for IV Venofer  today. Pre iron  BP was low, 93/51. Patient states that she is feeling very weak and "given out". Patient also stated that her PMD, Dr. Joelle Musca recently put her on Valsartan  40 mg PO daily to "protect her kidneys". Her last dose of Valsartan  was this morning around 8:00 am. Post iron  BP was low (lowest reading was 72/48). MD notified. Per Dr. Adrian Alba, gave patient 500 ml of NS over 30 minutes. Instructed patient to contact her PMD prior to taking her next dose of Valsartan . Patient verbalized understanding. Post fluid BP was improved at 96/58. Patient discharged in ambulatory, stable condition.

## 2023-09-18 NOTE — Telephone Encounter (Signed)
 Left message to call office

## 2023-09-18 NOTE — Telephone Encounter (Signed)
 Good to hear she feels better Would restart the medication if her BP goes up

## 2023-09-18 NOTE — Telephone Encounter (Signed)
 Copied from CRM [id number]. Topic: Clinical - Medical Advice >> Sep 18, 2023  1:01 PM Shereese L wrote: Reason for EAV:[id number] a call to Anesia Blackwell patient stated that she's feeling better and her pressure was 121/60

## 2023-09-19 ENCOUNTER — Inpatient Hospital Stay

## 2023-09-19 VITALS — BP 113/72 | HR 72 | Temp 97.9°F | Resp 16

## 2023-09-19 DIAGNOSIS — D509 Iron deficiency anemia, unspecified: Secondary | ICD-10-CM | POA: Diagnosis not present

## 2023-09-19 MED ORDER — IRON SUCROSE 20 MG/ML IV SOLN
200.0000 mg | Freq: Once | INTRAVENOUS | Status: AC
  Administered 2023-09-19: 200 mg via INTRAVENOUS
  Filled 2023-09-19: qty 10

## 2023-09-24 ENCOUNTER — Inpatient Hospital Stay

## 2023-09-24 VITALS — BP 90/55 | HR 87 | Temp 98.1°F | Resp 15

## 2023-09-24 DIAGNOSIS — D509 Iron deficiency anemia, unspecified: Secondary | ICD-10-CM

## 2023-09-24 MED ORDER — IRON SUCROSE 20 MG/ML IV SOLN
200.0000 mg | Freq: Once | INTRAVENOUS | Status: AC
  Administered 2023-09-24: 200 mg via INTRAVENOUS

## 2023-09-24 NOTE — Progress Notes (Signed)
 Declined post observation. Vitals stable at discharge.

## 2023-09-24 NOTE — Patient Instructions (Signed)

## 2023-09-26 ENCOUNTER — Other Ambulatory Visit (INDEPENDENT_AMBULATORY_CARE_PROVIDER_SITE_OTHER)

## 2023-09-26 DIAGNOSIS — N1831 Chronic kidney disease, stage 3a: Secondary | ICD-10-CM | POA: Diagnosis not present

## 2023-09-26 LAB — RENAL FUNCTION PANEL
Albumin: 4.3 g/dL (ref 3.6–5.1)
BUN/Creatinine Ratio: 21 (calc) (ref 6–22)
BUN: 26 mg/dL — ABNORMAL HIGH (ref 7–25)
CO2: 25 mmol/L (ref 20–32)
Calcium: 9.6 mg/dL (ref 8.6–10.4)
Chloride: 103 mmol/L (ref 98–110)
Creat: 1.25 mg/dL — ABNORMAL HIGH (ref 0.60–1.00)
Glucose, Bld: 94 mg/dL (ref 65–99)
Phosphorus: 2.5 mg/dL (ref 2.1–4.3)
Potassium: 3.6 mmol/L (ref 3.5–5.3)
Sodium: 139 mmol/L (ref 135–146)

## 2023-09-26 NOTE — Addendum Note (Signed)
 Addended by: Bernadene Brewer on: 09/26/2023 12:19 PM   Modules accepted: Orders

## 2023-10-15 ENCOUNTER — Encounter: Admitting: Internal Medicine

## 2023-11-01 ENCOUNTER — Encounter: Payer: Self-pay | Admitting: Advanced Practice Midwife

## 2023-11-07 DIAGNOSIS — K219 Gastro-esophageal reflux disease without esophagitis: Secondary | ICD-10-CM | POA: Diagnosis not present

## 2023-11-07 DIAGNOSIS — K449 Diaphragmatic hernia without obstruction or gangrene: Secondary | ICD-10-CM | POA: Diagnosis not present

## 2023-11-07 DIAGNOSIS — D500 Iron deficiency anemia secondary to blood loss (chronic): Secondary | ICD-10-CM | POA: Diagnosis not present

## 2023-11-18 ENCOUNTER — Encounter: Payer: Self-pay | Admitting: Surgery

## 2023-11-18 ENCOUNTER — Ambulatory Visit (INDEPENDENT_AMBULATORY_CARE_PROVIDER_SITE_OTHER): Payer: Self-pay | Admitting: Surgery

## 2023-11-18 ENCOUNTER — Encounter: Payer: Self-pay | Admitting: Oncology

## 2023-11-18 VITALS — BP 145/82 | HR 76 | Temp 98.3°F | Ht 65.0 in | Wt 128.8 lb

## 2023-11-18 DIAGNOSIS — R1310 Dysphagia, unspecified: Secondary | ICD-10-CM | POA: Diagnosis not present

## 2023-11-18 DIAGNOSIS — K2100 Gastro-esophageal reflux disease with esophagitis, without bleeding: Secondary | ICD-10-CM | POA: Diagnosis not present

## 2023-11-18 DIAGNOSIS — K449 Diaphragmatic hernia without obstruction or gangrene: Secondary | ICD-10-CM | POA: Diagnosis not present

## 2023-11-18 NOTE — Patient Instructions (Signed)
 We have scheduled you for a CT Scan of your Abdomen and Pelvis with contrast and Barium Swallow. This has been scheduled at [facility] on 11/21/2023 . Please arrive there by 2:15 pm . No eating or drinking 3 hours prior. If you need to reschedule your Scan, you may do so by calling [phone]. Please let us  know if you reschedule your scan as we have to get authorization from your insurance for this.    Hiatal Hernia  A hiatal hernia occurs when part of the stomach slides above the muscle that separates the abdomen from the chest (diaphragm). A person can be born with a hiatal hernia (congenital), or it may develop over time. In almost all cases of hiatal hernia, only the top part of the stomach pushes through the diaphragm. Many people have a hiatal hernia with no symptoms. The larger the hernia, the more likely it is that you will have symptoms. In some cases, a hiatal hernia allows stomach acid to flow back into the tube that carries food from your mouth to your stomach (esophagus). This may cause heartburn symptoms. The development of heartburn symptoms may mean that you have a condition called gastroesophageal reflux disease (GERD). What are the causes? This condition is caused by a weakness in the opening (hiatus) where the esophagus passes through the diaphragm to attach to the upper part of the stomach. A person may be born with a weakness in the hiatus, or a weakness can develop over time. What increases the risk? This condition is more likely to develop in: Older people. Age is a major risk factor for a hiatal hernia, especially if you are over the age of 74. Pregnant women. People who are overweight. People who have frequent constipation. What are the signs or symptoms? Symptoms of this condition usually develop in the form of GERD symptoms. Symptoms include: Heartburn. Upset stomach (indigestion). Trouble swallowing. Coughing or wheezing. Wheezing is making high-pitched  whistling sounds when you breathe. Sore throat. Chest pain. Nausea and vomiting. How is this diagnosed? This condition may be diagnosed during testing for GERD. Tests that may be done include: X-rays of your stomach or chest. An upper gastrointestinal (GI) series. This is an X-ray exam of your GI tract that is taken after you swallow a chalky liquid that shows up clearly on the X-ray. Endoscopy. This is a procedure to look into your stomach using a thin, flexible tube that has a tiny camera and light on the end of it. How is this treated? This condition may be treated by: Dietary and lifestyle changes to help reduce GERD symptoms. Medicines. These may include: Over-the-counter antacids. Medicines that make your stomach empty more quickly. Medicines that block the production of stomach acid (H2 blockers). Stronger medicines to reduce stomach acid (proton pump inhibitors). Surgery to repair the hernia, if other treatments are not helping. If you have no symptoms, you may not need treatment. Follow these instructions at home: Lifestyle and activity Do not use any products that contain nicotine or tobacco. These products include cigarettes, chewing tobacco, and vaping devices, such as e-cigarettes. If you need help quitting, ask your health care provider. Try to achieve and maintain a healthy body weight. Avoid putting pressure on your abdomen. Anything that puts pressure on your abdomen increases the amount of acid that may be pushed up into your esophagus. Avoid bending over, especially after eating. Raise the head of your bed by putting blocks under the legs. This keeps your head  and esophagus higher than your stomach. Do not wear tight clothing around your chest or stomach. Try not to strain when having a bowel movement, when urinating, or when lifting heavy objects. Eating and drinking Avoid foods that can worsen GERD symptoms. These may include: Fatty foods, like fried foods. Citrus  fruits, like oranges or lemon. Other foods and drinks that contain acid, like orange juice or tomatoes. Spicy food. Chocolate. Eat frequent small meals instead of three large meals a day. This helps prevent your stomach from getting too full. Eat slowly. Do not lie down right after eating. Do not eat 1-2 hours before bed. Do not drink beverages with caffeine. These include cola, coffee, cocoa, and tea. Do not drink alcohol. General instructions Take over-the-counter and prescription medicines only as told by your health care provider. Keep all follow-up visits. Your health care provider will want to check that any new prescribed medicines are helping your symptoms. Contact a health care provider if: Your symptoms are not controlled with medicines or lifestyle changes. You are having trouble swallowing. You have coughing or wheezing that will not go away. Your pain is getting worse. Your pain spreads to your arms, neck, jaw, teeth, or back. You feel nauseous or you vomit. Get help right away if: You have shortness of breath. You vomit blood. You have bright red blood in your stools. You have black, tarry stools. These symptoms may be an emergency. Get help right away. Call 911. Do not wait to see if the symptoms will go away. Do not drive yourself to the hospital. Summary A hiatal hernia occurs when part of the stomach slides above the muscle that separates the abdomen from the chest. A person may be born with a weakness in the hiatus, or a weakness can develop over time. Symptoms of a hiatal hernia may include heartburn, trouble swallowing, or sore throat. Management of a hiatal hernia includes eating frequent small meals instead of three large meals a day. Get help right away if you vomit blood, have bright red blood in your stools, or have black, tarry stools. This information is not intended to replace advice given to you by your health care provider. Make sure you discuss any  questions you have with your health care provider. Document Revised: 05/30/2021 Document Reviewed: 05/30/2021 Elsevier Patient Education  2024 [employer].

## 2023-11-18 NOTE — Progress Notes (Signed)
 Patient ID: Meghan Moody, female   DOB: [birth year], 78 y.o.   MRN: [id number]  HPI Meghan Moody is a 78 y.o. female seen in consultation at the request of Dr. Unk for a paraesophageal hernia.  She reports.  History of significant reflux as well as some some dysphagia for certain meals including rice and [place].  She did have an EGD by Dr. Unk couple years ago showing evidence of paraesophageal hernia.  Please note that I personally reviewed.  She did also have a chest x-ray in 2017 that I have also personally reviewed showed also a hiatal hernia.  She did have a recent history of black stool.  She also had had prior capsule endoscopyscattered erosions in the distal small bowel  and EGDs.  He was thought that the paraesophageal hernia was contributing to the GI bleed. She did see Dr. Sheldon from [place] couple years ago. She is able to perform more than 4 METS of activity without any shortness of or chest pain. Her only abdominal operation is tubal ligation.  She does have a history of chronic anemia with a last hemoglobin of 12.2, she does have a history of chronic kidney disease with a baseline creatinine of 1.25   HPI  Past Medical History:  Diagnosis Date   Anemia    Chronic bronchitis (HCC)    Chronic cough    Deafness in left ear    GERD (gastroesophageal reflux disease)    H/O hemorrhoidectomy    Hiatal hernia    Hyperlipidemia    Hypertension    SOB (shortness of breath) on exertion     Past Surgical History:  Procedure Laterality Date   BREAST CYST ASPIRATION Bilateral    neg   BREAST EXCISIONAL BIOPSY Right 1978   neg   BUNIONECTOMY     CATARACT EXTRACTION W/PHACO Right 03/30/2019   Procedure: CATARACT EXTRACTION PHACO AND INTRAOCULAR LENS PLACEMENT (IOC) RIGHT, 1.23, 00:20.8;  Surgeon: Myrna Adine Anes, MD;  Location: [facility];  Service: Ophthalmology;  Laterality: Right;   CATARACT EXTRACTION W/PHACO Left 04/27/2019   Procedure: CATARACT  EXTRACTION PHACO AND INTRAOCULAR LENS PLACEMENT (IOC) LEFT 1.46  00:20.3;  Surgeon: Myrna Adine Anes, MD;  Location: [facility];  Service: Ophthalmology;  Laterality: Left;   COLONOSCOPY WITH PROPOFOL  N/A 11/02/2019   Procedure: COLONOSCOPY WITH PROPOFOL ;  Surgeon: Unk Corinn Skiff, MD;  Location: [facility] ENDOSCOPY;  Service: Gastroenterology;  Laterality: N/A;   COLONOSCOPY WITH PROPOFOL  N/A 08/01/2021   Procedure: COLONOSCOPY WITH PROPOFOL ;  Surgeon: Unk Corinn Skiff, MD;  Location: [facility] ENDOSCOPY;  Service: Gastroenterology;  Laterality: N/A;   ESOPHAGOGASTRODUODENOSCOPY (EGD) WITH PROPOFOL  N/A 11/02/2019   Procedure: ESOPHAGOGASTRODUODENOSCOPY (EGD) WITH PROPOFOL ;  Surgeon: Unk Corinn Skiff, MD;  Location: [facility] ENDOSCOPY;  Service: Gastroenterology;  Laterality: N/A;   ESOPHAGOGASTRODUODENOSCOPY (EGD) WITH PROPOFOL  N/A 08/01/2021   Procedure: ESOPHAGOGASTRODUODENOSCOPY (EGD) WITH PROPOFOL ;  Surgeon: Unk Corinn Skiff, MD;  Location: [facility] ENDOSCOPY;  Service: Gastroenterology;  Laterality: N/A;  with Capsule placement   GIVENS CAPSULE STUDY N/A 08/01/2021   Procedure: GIVENS CAPSULE STUDY;  Surgeon: Unk Corinn Skiff, MD;  Location: [facility] ENDOSCOPY;  Service: Gastroenterology;  Laterality: N/A;   TUBAL LIGATION      Family History  Problem Relation Age of Onset   Coronary artery disease Mother    Heart disease Mother    Hypertension Mother    Stroke Father    Drug abuse Brother    Stroke Brother     Social History  Social History   Tobacco Use   Smoking status: Former    Current packs/day: 0.00    Average packs/day: 0.3 packs/day for 40.0 years (12.0 ttl pk-yrs)    Types: Cigarettes    Start date: 04/16/1966    Quit date: 04/16/2006    Years since quitting: 17.6    Passive exposure: Current   Smokeless tobacco: Never  Vaping Use   Vaping status: Never Used  Substance Use Topics   Alcohol use: No   Drug use: No    No Known Allergies  Current Outpatient  Medications  Medication Sig Dispense Refill   acetaminophen  (TYLENOL ) 500 MG tablet Take 500 mg by mouth every 8 (eight) hours as needed.     aspirin  EC 81 MG tablet Take 1 tablet (81 mg total) by mouth daily. Swallow whole.     atorvastatin  (LIPITOR) 40 MG tablet Take 1 tablet (40 mg total) by mouth daily. 90 tablet 3   cilostazol  (PLETAL ) 50 MG tablet Take 1 tablet (50 mg total) by mouth 2 (two) times daily. 180 tablet 3   omeprazole  (PRILOSEC ) 40 MG capsule Take 40 mg by mouth daily.     RESTASIS 0.05 % ophthalmic emulsion 1 drop 2 (two) times daily.     triamterene -hydrochlorothiazide (MAXZIDE-25) 37.5-25 MG tablet TAKE 1/2 TABLET BY MOUTH DAILY 45 tablet 3   valsartan  (DIOVAN ) 40 MG tablet Take 1 tablet (40 mg total) by mouth daily. 90 tablet 3   No current facility-administered medications for this visit.     Review of Systems Full ROS  was asked and was negative except for the information on the HPI  Physical Exam Blood pressure (!) 145/82, pulse 76, temperature 98.3 F (36.8 C), temperature source Oral, height 5' 5 (1.651 m), weight 128 lb 12.8 oz (58.4 kg), SpO2 96%. CONSTITUTIONAL: NAD. EYES: Pupils are equal, round, Sclera are non-icteric. EARS, NOSE, MOUTH AND THROAT: The oropharynx is clear. The oral mucosa is pink and moist. Hearing is intact to voice. LYMPH NODES:  Lymph nodes in the neck are normal. RESPIRATORY:  Lungs are clear. There is normal respiratory effort, with equal breath sounds bilaterally, and without pathologic use of accessory muscles. CARDIOVASCULAR: Heart is regular without murmurs, gallops, or rubs. GI: The abdomen is  soft, nontender, and nondistended. There are no palpable masses. There is no hepatosplenomegaly. There are normal bowel soundsGU: Rectal deferred.   MUSCULOSKELETAL: Normal muscle strength and tone. No cyanosis or edema.   SKIN: Turgor is good and there are no pathologic skin lesions or ulcers. NEUROLOGIC: Motor and sensation is grossly  normal. Cranial nerves are grossly intact. PSYCH:  Oriented to person, place and time. Affect is normal.  Data Reviewed I have personally reviewed the patient's imaging, laboratory findings and medical records.    Assessment/Plan 60 female with type III paraesophageal hernia and significant symptoms including reflux and dysphagia not responsive to medical therapy..  Discussed with the patient in detail about her disease process.  I do think that is worth pursuing further workup.  We will obtain EGD , CT A/P and barium swallow.  We talked regarding her disease process and role for surgical intervention.  I did talk to her specifically about what surgery will entail the restrictions to risk the benefits and the possible complications including but not limited to: Bleeding, infection injury to bowel or esophagus. SHe would be a good potential candidate for robotic approach We also talked about diet modifications following surgery.  She seems to understand this.  I personally spent a total of 60 minutes in the care of the patient today including performing a medically appropriate exam/evaluation, counseling and educating, placing orders, referring and communicating with other health care professionals, documenting clinical information in the EHR, independently interpreting and reviewing images studies and coordinating care.    Laneta Luna, MD FACS General Surgeon 11/18/2023, 5:35 PM

## 2023-11-20 ENCOUNTER — Other Ambulatory Visit: Payer: Self-pay | Admitting: Cardiovascular Disease

## 2023-11-20 ENCOUNTER — Other Ambulatory Visit: Payer: Self-pay | Admitting: Internal Medicine

## 2023-11-21 ENCOUNTER — Ambulatory Visit
Admission: RE | Admit: 2023-11-21 | Discharge: 2023-11-21 | Disposition: A | Discharge status: Home / Self Care | Source: Ambulatory Visit | Attending: Surgery | Admitting: Surgery

## 2023-11-21 DIAGNOSIS — K449 Diaphragmatic hernia without obstruction or gangrene: Secondary | ICD-10-CM | POA: Diagnosis not present

## 2023-11-21 DIAGNOSIS — K2100 Gastro-esophageal reflux disease with esophagitis, without bleeding: Secondary | ICD-10-CM | POA: Insufficient documentation

## 2023-11-21 DIAGNOSIS — K219 Gastro-esophageal reflux disease without esophagitis: Secondary | ICD-10-CM | POA: Diagnosis not present

## 2023-11-21 DIAGNOSIS — N200 Calculus of kidney: Secondary | ICD-10-CM | POA: Diagnosis not present

## 2023-11-21 DIAGNOSIS — R1310 Dysphagia, unspecified: Secondary | ICD-10-CM | POA: Insufficient documentation

## 2023-11-21 DIAGNOSIS — K5730 Diverticulosis of large intestine without perforation or abscess without bleeding: Secondary | ICD-10-CM | POA: Diagnosis not present

## 2023-11-21 DIAGNOSIS — K224 Dyskinesia of esophagus: Secondary | ICD-10-CM | POA: Diagnosis not present

## 2023-11-21 MED ORDER — IOHEXOL 300 MG/ML  SOLN
100.0000 mL | Freq: Once | INTRAMUSCULAR | Status: AC | PRN
  Administered 2023-11-21: 100 mL via INTRAVENOUS

## 2023-11-29 ENCOUNTER — Inpatient Hospital Stay: Attending: Oncology

## 2023-11-29 VITALS — BP 117/64 | HR 79 | Temp 98.3°F | Resp 16

## 2023-11-29 DIAGNOSIS — D509 Iron deficiency anemia, unspecified: Secondary | ICD-10-CM | POA: Insufficient documentation

## 2023-11-29 MED ORDER — IRON SUCROSE 20 MG/ML IV SOLN
200.0000 mg | Freq: Once | INTRAVENOUS | Status: AC
  Administered 2023-11-29: 200 mg via INTRAVENOUS
  Filled 2023-11-29: qty 10

## 2023-12-03 ENCOUNTER — Encounter: Payer: Self-pay | Admitting: Gastroenterology

## 2023-12-03 ENCOUNTER — Telehealth: Payer: Self-pay | Admitting: Oncology

## 2023-12-03 ENCOUNTER — Inpatient Hospital Stay

## 2023-12-03 NOTE — Telephone Encounter (Signed)
 Pt called and left vm to cancel infusion for today due to having an endoscopy scheduled for tomorrow. Pt requested to cancel appt for today - appt canceled - clinical team notified - LH

## 2023-12-04 ENCOUNTER — Encounter: Payer: Self-pay | Admitting: Gastroenterology

## 2023-12-04 ENCOUNTER — Ambulatory Visit: Admitting: Anesthesiology

## 2023-12-04 ENCOUNTER — Ambulatory Visit
Admission: RE | Admit: 2023-12-04 | Discharge: 2023-12-04 | Disposition: A | Discharge status: Home / Self Care | Attending: Gastroenterology | Admitting: Gastroenterology

## 2023-12-04 ENCOUNTER — Encounter
Admission: RE | Disposition: A | Discharge status: Home / Self Care | Payer: Self-pay | Source: Home / Self Care | Attending: Gastroenterology

## 2023-12-04 ENCOUNTER — Other Ambulatory Visit: Payer: Self-pay

## 2023-12-04 DIAGNOSIS — D500 Iron deficiency anemia secondary to blood loss (chronic): Secondary | ICD-10-CM | POA: Insufficient documentation

## 2023-12-04 DIAGNOSIS — K449 Diaphragmatic hernia without obstruction or gangrene: Secondary | ICD-10-CM | POA: Insufficient documentation

## 2023-12-04 DIAGNOSIS — I739 Peripheral vascular disease, unspecified: Secondary | ICD-10-CM | POA: Insufficient documentation

## 2023-12-04 DIAGNOSIS — I129 Hypertensive chronic kidney disease with stage 1 through stage 4 chronic kidney disease, or unspecified chronic kidney disease: Secondary | ICD-10-CM | POA: Diagnosis not present

## 2023-12-04 DIAGNOSIS — N183 Chronic kidney disease, stage 3 unspecified: Secondary | ICD-10-CM | POA: Diagnosis not present

## 2023-12-04 DIAGNOSIS — Z87891 Personal history of nicotine dependence: Secondary | ICD-10-CM | POA: Insufficient documentation

## 2023-12-04 DIAGNOSIS — I10 Essential (primary) hypertension: Secondary | ICD-10-CM | POA: Diagnosis not present

## 2023-12-04 HISTORY — DX: Chronic gastric ulcer without hemorrhage or perforation: K25.7

## 2023-12-04 HISTORY — PX: ESOPHAGOGASTRODUODENOSCOPY: SHX5428

## 2023-12-04 HISTORY — DX: Iron deficiency anemia secondary to blood loss (chronic): D50.0

## 2023-12-04 SURGERY — EGD (ESOPHAGOGASTRODUODENOSCOPY)
Anesthesia: General

## 2023-12-04 MED ORDER — SODIUM CHLORIDE 0.9 % IV SOLN: INTRAVENOUS | Status: DC

## 2023-12-04 MED ORDER — LIDOCAINE HCL (PF) 2 % IJ SOLN
INTRAMUSCULAR | Status: AC
Start: 2023-12-04 — End: 2023-12-04
  Filled 2023-12-04: qty 5

## 2023-12-04 MED ORDER — GLYCOPYRROLATE 0.2 MG/ML IJ SOLN
INTRAMUSCULAR | Status: DC | PRN
  Administered 2023-12-04: .2 mg via INTRAVENOUS

## 2023-12-04 MED ORDER — PROPOFOL 10 MG/ML IV BOLUS
INTRAVENOUS | Status: AC
  Filled 2023-12-04: qty 20

## 2023-12-04 MED ORDER — PROPOFOL 500 MG/50ML IV EMUL
INTRAVENOUS | Status: DC | PRN
  Administered 2023-12-04: 75 ug/kg/min via INTRAVENOUS

## 2023-12-04 MED ORDER — GLYCOPYRROLATE 0.2 MG/ML IJ SOLN
INTRAMUSCULAR | Status: AC
  Filled 2023-12-04: qty 1

## 2023-12-04 MED ORDER — PROPOFOL 10 MG/ML IV BOLUS
INTRAVENOUS | Status: DC | PRN
  Administered 2023-12-04 (×2): 40 mg via INTRAVENOUS

## 2023-12-04 MED ORDER — LIDOCAINE HCL (CARDIAC) PF 100 MG/5ML IV SOSY
PREFILLED_SYRINGE | INTRAVENOUS | Status: DC | PRN
  Administered 2023-12-04: 40 mg via INTRAVENOUS

## 2023-12-04 NOTE — Anesthesia Postprocedure Evaluation (Signed)
 Anesthesia Post Note  Patient: Meghan Moody  Procedure(s) Performed: EGD (ESOPHAGOGASTRODUODENOSCOPY)  Patient location during evaluation: Endoscopy Anesthesia Type: General Level of consciousness: awake and alert Pain management: pain level controlled Vital Signs Assessment: post-procedure vital signs reviewed and stable Respiratory status: spontaneous breathing, nonlabored ventilation, respiratory function stable and patient connected to nasal cannula oxygen Cardiovascular status: blood pressure returned to baseline and stable Postop Assessment: no apparent nausea or vomiting Anesthetic complications: no   No notable events documented.   Last Vitals:  Vitals:   12/04/23 1127 12/04/23 1137  BP: 127/66 (!) 143/83  Pulse: (!) 57 (!) 57  Resp: 16 18  Temp:    SpO2: 96% 98%    Last Pain:  Vitals:   12/04/23 1127  TempSrc:   PainSc: 0-No pain                 [name]

## 2023-12-04 NOTE — H&P (Signed)
 Corinn JONELLE Brooklyn, MD [facility], DHIP [address]  [place], [place] [address]  Main: [phone] Fax:  [phone] Pager: [phone]   Primary Care Physician:  Jimmy Charlie FERNS, MD Primary Gastroenterologist:  Dr. Corinn JONELLE Brooklyn  Pre-Procedure History & Physical: HPI:  Meghan Moody is a 78 y.o. female is here for an endoscopy.   Past Medical History:  Diagnosis Date   Anemia    Chronic bronchitis (HCC)    Chronic Cameron ulcer    Chronic cough    Deafness in left ear    GERD (gastroesophageal reflux disease)    H/O hemorrhoidectomy    Hiatal hernia    Hyperlipidemia    Hypertension    Iron  deficiency anemia due to chronic blood loss    SOB (shortness of breath) on exertion     Past Surgical History:  Procedure Laterality Date   BREAST CYST ASPIRATION Bilateral    neg   BREAST EXCISIONAL BIOPSY Right 1978   neg   BUNIONECTOMY     CATARACT EXTRACTION W/PHACO Right 03/30/2019   Procedure: CATARACT EXTRACTION PHACO AND INTRAOCULAR LENS PLACEMENT (IOC) RIGHT, 1.23, 00:20.8;  Surgeon: Myrna Adine Anes, MD;  Location: [facility];  Service: Ophthalmology;  Laterality: Right;   CATARACT EXTRACTION W/PHACO Left 04/27/2019   Procedure: CATARACT EXTRACTION PHACO AND INTRAOCULAR LENS PLACEMENT (IOC) LEFT 1.46  00:20.3;  Surgeon: Myrna Adine Anes, MD;  Location: [facility]ERY [facility]TR;  Service: Ophthalmology;  Laterality: Left;   COLONOSCOPY WITH PROPOFOL  N/A 11/02/2019   Procedure: COLONOSCOPY WITH PROPOFOL ;  Surgeon: Brooklyn Corinn Skiff, MD;  Location: [facility] ENDOSCOPY;  Service: Gastroenterology;  Laterality: N/A;   COLONOSCOPY WITH PROPOFOL  N/A 08/01/2021   Procedure: COLONOSCOPY WITH PROPOFOL ;  Surgeon: Brooklyn Corinn Skiff, MD;  Location: [facility] ENDOSCOPY;  Service: Gastroenterology;  Laterality: N/A;   ESOPHAGOGASTRODUODENOSCOPY (EGD) WITH PROPOFOL  N/A 11/02/2019   Procedure: ESOPHAGOGASTRODUODENOSCOPY (EGD) WITH PROPOFOL ;  Surgeon:  Brooklyn Corinn Skiff, MD;  Location: [facility] ENDOSCOPY;  Service: Gastroenterology;  Laterality: N/A;   ESOPHAGOGASTRODUODENOSCOPY (EGD) WITH PROPOFOL  N/A 08/01/2021   Procedure: ESOPHAGOGASTRODUODENOSCOPY (EGD) WITH PROPOFOL ;  Surgeon: Brooklyn Corinn Skiff, MD;  Location: [facility] ENDOSCOPY;  Service: Gastroenterology;  Laterality: N/A;  with Capsule placement   GIVENS CAPSULE STUDY N/A 08/01/2021   Procedure: GIVENS CAPSULE STUDY;  Surgeon: Brooklyn Corinn Skiff, MD;  Location: [facility] ENDOSCOPY;  Service: Gastroenterology;  Laterality: N/A;   TUBAL LIGATION      Prior to Admission medications   Medication Sig Start Date End Date Taking? Authorizing Provider  acetaminophen  (TYLENOL ) 500 MG tablet Take 500 mg by mouth every 8 (eight) hours as needed.    [provider]  aspirin  EC 81 MG tablet Take 1 tablet (81 mg total) by mouth daily. Swallow whole. 10/24/21   Darron Deatrice LABOR, MD  atorvastatin  (LIPITOR) 40 MG tablet Take 1 tablet (40 mg total) by mouth daily. 11/29/22   Darron Deatrice LABOR, MD  cilostazol  (PLETAL ) 50 MG tablet TAKE 1 TABLET BY MOUTH TWICE A DAY 11/21/23   Darron Deatrice LABOR, MD  omeprazole  (PRILOSEC ) 40 MG capsule Take 40 mg by mouth daily.    [provider]  RESTASIS 0.05 % ophthalmic emulsion 1 drop 2 (two) times daily. 08/08/20   [provider]  triamterene -hydrochlorothiazide (MAXZIDE-25) 37.5-25 MG tablet TAKE 1/2 TABLET BY MOUTH DAILY 11/21/23   Letvak, Richard I, MD  valsartan  (DIOVAN ) 40 MG tablet Take 1 tablet (40 mg total) by mouth daily. 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  Tobacco Use   Smoking status: Former    Current packs/day: 0.00    Average packs/day: 0.3 packs/day for 40.0 years (12.0 ttl pk-yrs)    Types: Cigarettes    Start date: 04/16/1966    Quit date: 04/16/2006    Years since quitting: 17.6    Passive exposure: Current   Smokeless tobacco: Never  Vaping Use   Vaping status: Never Used  Substance and Sexual Activity   Alcohol use: No   Drug use: No   Sexual activity: Not on file  Other Topics Concern   Not on file  Social History Narrative   Widowed, 1 son   New relationship since 2013      Has living will   Son Omar is health care POA   Would accept resuscitation attempts but no prolonged ventilation.   Would probably not accept tube feeds   Social Drivers of Corporate investment banker Strain: Not on file  Food Insecurity: Not on file  Transportation Needs: Not on file  Physical Activity: Not on file  Stress: Not on file  Social Connections: Not on file  Intimate Partner Violence: Not on file    Review of Systems: See HPI, otherwise negative ROS  Physical Exam: BP (!) 150/70   Pulse 62   Temp 97.7 F (36.5 C) (Tympanic)   Resp 18   Ht 5' 5 (1.651 m)   Wt 57.7 kg   SpO2 97%   BMI 21.17 kg/m  General:   Alert,  pleasant and cooperative in NAD Head:  Normocephalic and atraumatic. Neck:  Supple; no masses or thyromegaly. Lungs:  Clear throughout to auscultation.    Heart:  Regular rate and rhythm. Abdomen:  Soft, nontender and nondistended. Normal bowel sounds, without guarding, and without rebound.   Neurologic:  Alert and  oriented x4;  grossly normal neurologically.  Impression/Plan: Meghan Moody is here for an endoscopy to be performed for follow of IDA  Risks, benefits, limitations, and alternatives regarding  endoscopy have been reviewed with the patient.  Questions have  been answered.  All parties agreeable.   Corinn Brooklyn, MD  12/04/2023, 10:38 AM

## 2023-12-04 NOTE — Anesthesia Preprocedure Evaluation (Addendum)
 Anesthesia Evaluation  Patient identified by MRN, date of birth, ID band Patient awake    Reviewed: Allergy & Precautions, NPO status , Patient's Chart, lab work & pertinent test results  History of Anesthesia Complications Negative for: history of anesthetic complications  Airway Mallampati: II   Neck ROM: Full    Dental  (+) Missing   Pulmonary former smoker (quit 2008)   Pulmonary exam normal breath sounds clear to auscultation       Cardiovascular hypertension, + Peripheral Vascular Disease  Normal cardiovascular exam Rhythm:Regular Rate:Normal     Neuro/Psych HOH    GI/Hepatic hiatal hernia,GERD  ,,  Endo/Other  negative endocrine ROS    Renal/GU Renal disease (stage III CKD)     Musculoskeletal   Abdominal   Peds  Hematology  (+) Blood dyscrasia, anemia   Anesthesia Other Findings Cardiology note 11/29/22:  ASSESSMENT AND PLAN  1.  Peripheral arterial disease: Left calf claudication due to occluded left SFA.  Her symptoms improved with cilostazol  and she currently has mild symptoms that are not lifestyle limiting.  Thus, no indication for revascularization.  Will refill cilostazol .  Continue as daily.  She has no symptoms of coronary artery disease.   I encouraged her to continue daily walking.   2.  Hyperlipidemia: Lipid profile showed an LDL of 78 which is worse at target.  Continue statin 40 mg daily.   3.  Essential hypertension: Blood pressure is controlled.  Disposition:   FU with me in 12 months  Reproductive/Obstetrics                              Anesthesia Physical Anesthesia Plan  ASA: 2  Anesthesia Plan: General   Post-op Pain Management:    Induction: Intravenous  PONV Risk Score and Plan: 3 and Propofol  infusion, TIVA and Treatment may vary due to age or medical condition  Airway Management Planned: Natural Airway  Additional Equipment:   Intra-op  Plan:   Post-operative Plan:   Informed Consent: I have reviewed the patients History and Physical, chart, labs and discussed the procedure including the risks, benefits and alternatives for the proposed anesthesia with the patient or authorized representative who has indicated his/her understanding and acceptance.       Plan Discussed with: CRNA  Anesthesia Plan Comments: (LMA/GETA backup discussed.  Patient consented for risks of anesthesia including but not limited to:  - adverse reactions to medications - damage to eyes, teeth, lips or other oral mucosa - nerve damage due to positioning  - sore throat or hoarseness - damage to heart, brain, nerves, lungs, other parts of body or loss of life  Informed patient about role of CRNA in peri- and intra-operative care.  Patient voiced understanding.)         Anesthesia Quick Evaluation

## 2023-12-04 NOTE — Transfer of Care (Signed)
 Immediate Anesthesia Transfer of Care Note  Patient: Meghan Moody  Procedure(s) Performed: EGD (ESOPHAGOGASTRODUODENOSCOPY)  Patient Location: PACU  Anesthesia Type:General  Level of Consciousness: sedated  Airway & Oxygen Therapy: Patient Spontanous Breathing  Post-op Assessment: Report given to RN and Post -op Vital signs reviewed and stable  Post vital signs: Reviewed and stable  Last Vitals:  Vitals Value Taken Time  BP    Temp    Pulse    Resp    SpO2      Last Pain:  Vitals:   12/04/23 0927  TempSrc: Tympanic  PainSc: 0-No pain         Complications: No notable events documented.

## 2023-12-04 NOTE — Op Note (Signed)
 [facility] Gastroenterology Patient Name: Meghan Moody Procedure Date: 12/04/2023 10:35 AM MRN: [id number] Account #: [id number] Date of Birth: [birth year] Admit Type: Outpatient Age: 78 Room: [facility] ENDO ROOM 4 Gender: Female Note Status: Finalized Instrument Name: Upper GI Scope [id number] Procedure:             Upper GI endoscopy Indications:           Iron  deficiency anemia secondary to chronic blood loss Providers:             Corinn Jess Brooklyn MD, MD Referring MD:          Charlie CHANETA [name] Medicines:             General Anesthesia Complications:         No immediate complications. Estimated blood loss: None. Procedure:             Pre-Anesthesia Assessment:                        - Prior to the procedure, a History and Physical was                         performed, and patient medications and allergies were                         reviewed. The patient is competent. The risks and                         benefits of the procedure and the sedation options and                         risks were discussed with the patient. All questions                         were answered and informed consent was obtained.                         Patient identification and proposed procedure were                         verified by the physician, the nurse, the                         anesthesiologist, the anesthetist and the technician                         in the pre-procedure area in the procedure room in the                         endoscopy suite. Mental Status Examination: alert and                         oriented. Airway Examination: normal oropharyngeal                         airway and neck mobility. Respiratory Examination:                         clear to auscultation. CV Examination: normal.  Prophylactic Antibiotics: The patient does not require                         prophylactic antibiotics. Prior Anticoagulants: The                          patient has taken no anticoagulant or antiplatelet                         agents. ASA Grade Assessment: II - A patient with mild                         systemic disease. After reviewing the risks and                         benefits, the patient was deemed in satisfactory                         condition to undergo the procedure. The anesthesia                         plan was to use general anesthesia. Immediately prior                         to administration of medications, the patient was                         re-assessed for adequacy to receive sedatives. The                         heart rate, respiratory rate, oxygen saturations,                         blood pressure, adequacy of pulmonary ventilation, and                         response to care were monitored throughout the                         procedure. The physical status of the patient was                         re-assessed after the procedure.                        After obtaining informed consent, the endoscope was                         passed under direct vision. Throughout the procedure,                         the patient's blood pressure, pulse, and oxygen                         saturations were monitored continuously. The Endoscope                         was introduced through the mouth, and advanced to the  second part of duodenum. The upper GI endoscopy was                         accomplished without difficulty. The patient tolerated                         the procedure well. Findings:      The duodenal bulb and second portion of the duodenum were normal.      A large hiatal hernia was present.      The gastroesophageal junction and examined esophagus were normal. Impression:            - Normal duodenal bulb and second portion of the                         duodenum.                        - Large hiatal hernia.                        - Normal  gastroesophageal junction and esophagus.                        - No specimens collected. Recommendation:        - Discharge patient to home (with escort).                        - Resume previous diet today.                        - Continue present medications. Procedure Code(s):     --- Professional ---                        [phone], Esophagogastroduodenoscopy, flexible,                         transoral; diagnostic, including collection of                         specimen(s) by brushing or washing, when performed                         (separate procedure) Diagnosis Code(s):     --- Professional ---                        K44.9, Diaphragmatic hernia without obstruction or                         gangrene                        D50.0, Iron  deficiency anemia secondary to blood loss                         (chronic) CPT copyright 2022 American Medical Association. All rights reserved. The codes documented in this report are preliminary and upon coder review may  be revised to meet current compliance requirements. Dr. Corinn Brooklyn Corinn Jess Brooklyn MD, MD 12/04/2023 11:05:05 AM This report has been signed electronically. Number of Addenda: 0 Note Initiated  On: 12/04/2023 10:35 AM Estimated Blood Loss:  Estimated blood loss: none.      [facility]

## 2023-12-05 ENCOUNTER — Inpatient Hospital Stay

## 2023-12-05 ENCOUNTER — Ambulatory Visit

## 2023-12-05 VITALS — BP 119/64 | HR 58 | Temp 97.5°F | Resp 16

## 2023-12-05 DIAGNOSIS — D509 Iron deficiency anemia, unspecified: Secondary | ICD-10-CM

## 2023-12-05 MED ORDER — IRON SUCROSE 20 MG/ML IV SOLN
200.0000 mg | Freq: Once | INTRAVENOUS | Status: AC
  Administered 2023-12-05: 200 mg via INTRAVENOUS
  Filled 2023-12-05: qty 10

## 2023-12-05 NOTE — Patient Instructions (Signed)

## 2023-12-09 ENCOUNTER — Ambulatory Visit (INDEPENDENT_AMBULATORY_CARE_PROVIDER_SITE_OTHER): Admitting: Surgery

## 2023-12-09 ENCOUNTER — Encounter: Payer: Self-pay | Admitting: Surgery

## 2023-12-09 VITALS — BP 140/72 | HR 69 | Ht 65.0 in | Wt 124.8 lb

## 2023-12-09 DIAGNOSIS — K449 Diaphragmatic hernia without obstruction or gangrene: Secondary | ICD-10-CM

## 2023-12-09 DIAGNOSIS — R1310 Dysphagia, unspecified: Secondary | ICD-10-CM | POA: Diagnosis not present

## 2023-12-09 DIAGNOSIS — K219 Gastro-esophageal reflux disease without esophagitis: Secondary | ICD-10-CM | POA: Diagnosis not present

## 2023-12-09 NOTE — Patient Instructions (Signed)
Hiatal Hernia  A hiatal hernia occurs when part of the stomach slides above the muscle that separates the abdomen from the chest (diaphragm). A person can be born with a hiatal hernia (congenital), or it may develop over time. In almost all cases of hiatal hernia, only the top part of the stomach pushes through the diaphragm. Many people have a hiatal hernia with no symptoms. The larger the hernia, the more likely it is that you will have symptoms. In some cases, a hiatal hernia allows stomach acid to flow back into the tube that carries food from your mouth to your stomach (esophagus). This may cause heartburn symptoms. The development of heartburn symptoms may mean that you have a condition called gastroesophageal reflux disease (GERD). What are the causes? This condition is caused by a weakness in the opening (hiatus) where the esophagus passes through the diaphragm to attach to the upper part of the stomach. A person may be born with a weakness in the hiatus, or a weakness can develop over time. What increases the risk? This condition is more likely to develop in: Older people. Age is a major risk factor for a hiatal hernia, especially if you are over the age of 74. Pregnant women. People who are overweight. People who have frequent constipation. What are the signs or symptoms? Symptoms of this condition usually develop in the form of GERD symptoms. Symptoms include: Heartburn. Upset stomach (indigestion). Trouble swallowing. Coughing or wheezing. Wheezing is making high-pitched whistling sounds when you breathe. Sore throat. Chest pain. Nausea and vomiting. How is this diagnosed? This condition may be diagnosed during testing for GERD. Tests that may be done include: X-rays of your stomach or chest. An upper gastrointestinal (GI) series. This is an X-ray exam of your GI tract that is taken after you swallow a chalky liquid that shows up clearly on the X-ray. Endoscopy. This is a  procedure to look into your stomach using a thin, flexible tube that has a tiny camera and light on the end of it. How is this treated? This condition may be treated by: Dietary and lifestyle changes to help reduce GERD symptoms. Medicines. These may include: Over-the-counter antacids. Medicines that make your stomach empty more quickly. Medicines that block the production of stomach acid (H2 blockers). Stronger medicines to reduce stomach acid (proton pump inhibitors). Surgery to repair the hernia, if other treatments are not helping. If you have no symptoms, you may not need treatment. Follow these instructions at home: Lifestyle and activity Do not use any products that contain nicotine or tobacco. These products include cigarettes, chewing tobacco, and vaping devices, such as e-cigarettes. If you need help quitting, ask your health care provider. Try to achieve and maintain a healthy body weight. Avoid putting pressure on your abdomen. Anything that puts pressure on your abdomen increases the amount of acid that may be pushed up into your esophagus. Avoid bending over, especially after eating. Raise the head of your bed by putting blocks under the legs. This keeps your head and esophagus higher than your stomach. Do not wear tight clothing around your chest or stomach. Try not to strain when having a bowel movement, when urinating, or when lifting heavy objects. Eating and drinking Avoid foods that can worsen GERD symptoms. These may include: Fatty foods, like fried foods. Citrus fruits, like oranges or lemon. Other foods and drinks that contain acid, like orange juice or tomatoes. Spicy food. Chocolate. Eat frequent small meals instead of three large meals a  day. This helps prevent your stomach from getting too full. Eat slowly. Do not lie down right after eating. Do not eat 1-2 hours before bed. Do not drink beverages with caffeine. These include cola, coffee, cocoa, and tea. Do  not drink alcohol. General instructions Take over-the-counter and prescription medicines only as told by your health care provider. Keep all follow-up visits. Your health care provider will want to check that any new prescribed medicines are helping your symptoms. Contact a health care provider if: Your symptoms are not controlled with medicines or lifestyle changes. You are having trouble swallowing. You have coughing or wheezing that will not go away. Your pain is getting worse. Your pain spreads to your arms, neck, jaw, teeth, or back. You feel nauseous or you vomit. Get help right away if: You have shortness of breath. You vomit blood. You have bright red blood in your stools. You have black, tarry stools. These symptoms may be an emergency. Get help right away. Call 911. Do not wait to see if the symptoms will go away. Do not drive yourself to the hospital. Summary A hiatal hernia occurs when part of the stomach slides above the muscle that separates the abdomen from the chest. A person may be born with a weakness in the hiatus, or a weakness can develop over time. Symptoms of a hiatal hernia may include heartburn, trouble swallowing, or sore throat. Management of a hiatal hernia includes eating frequent small meals instead of three large meals a day. Get help right away if you vomit blood, have bright red blood in your stools, or have black, tarry stools. This information is not intended to replace advice given to you by your health care provider. Make sure you discuss any questions you have with your health care provider. Document Revised: 05/30/2021 Document Reviewed: 05/30/2021 Elsevier Patient Education  2024 [employer].

## 2023-12-10 ENCOUNTER — Ambulatory Visit (INDEPENDENT_AMBULATORY_CARE_PROVIDER_SITE_OTHER): Admitting: Internal Medicine

## 2023-12-10 ENCOUNTER — Ambulatory Visit (INDEPENDENT_AMBULATORY_CARE_PROVIDER_SITE_OTHER)

## 2023-12-10 ENCOUNTER — Encounter: Payer: Self-pay | Admitting: Internal Medicine

## 2023-12-10 VITALS — BP 128/76 | HR 67 | Ht 65.0 in | Wt 126.0 lb

## 2023-12-10 DIAGNOSIS — I779 Disorder of arteries and arterioles, unspecified: Secondary | ICD-10-CM | POA: Diagnosis not present

## 2023-12-10 DIAGNOSIS — I10 Essential (primary) hypertension: Secondary | ICD-10-CM

## 2023-12-10 DIAGNOSIS — R0789 Other chest pain: Secondary | ICD-10-CM

## 2023-12-10 DIAGNOSIS — Z87891 Personal history of nicotine dependence: Secondary | ICD-10-CM

## 2023-12-10 DIAGNOSIS — N1831 Chronic kidney disease, stage 3a: Secondary | ICD-10-CM | POA: Diagnosis not present

## 2023-12-10 DIAGNOSIS — G8929 Other chronic pain: Secondary | ICD-10-CM

## 2023-12-10 DIAGNOSIS — D509 Iron deficiency anemia, unspecified: Secondary | ICD-10-CM | POA: Diagnosis not present

## 2023-12-10 DIAGNOSIS — K449 Diaphragmatic hernia without obstruction or gangrene: Secondary | ICD-10-CM | POA: Diagnosis not present

## 2023-12-10 DIAGNOSIS — M546 Pain in thoracic spine: Secondary | ICD-10-CM | POA: Diagnosis not present

## 2023-12-10 DIAGNOSIS — K219 Gastro-esophageal reflux disease without esophagitis: Secondary | ICD-10-CM | POA: Diagnosis not present

## 2023-12-10 DIAGNOSIS — M8580 Other specified disorders of bone density and structure, unspecified site: Secondary | ICD-10-CM | POA: Diagnosis not present

## 2023-12-10 DIAGNOSIS — M47814 Spondylosis without myelopathy or radiculopathy, thoracic region: Secondary | ICD-10-CM | POA: Diagnosis not present

## 2023-12-10 NOTE — Patient Instructions (Signed)
 Welcome [name]  Do not resume telmisartan unless I advise you to after seeing your labs from today   You may need to return for preoperative clearance; I'll find out

## 2023-12-10 NOTE — Assessment & Plan Note (Signed)
 TAKING  MAXZIDE

## 2023-12-10 NOTE — Progress Notes (Unsigned)
 Subjective:  Patient ID: Meghan Moody, female    DOB: 03-Mar-1946  Age: 78 y.o. MRN: [id number]  CC: The primary encounter diagnosis was Essential hypertension, benign. Diagnoses of Chronic bilateral thoracic back pain, History of tobacco use disorder, Peripheral arterial occlusive disease (HCC), Hiatal hernia with gastroesophageal reflux, Iron  deficiency anemia, unspecified iron  deficiency anemia type, Stage 3a chronic kidney disease (HCC), and Atypical chest pain were also pertinent to this visit.   1) RE CURRENT CHEST PAIN.  The pain occurs at rest. And does not  OCCUR WHEN WALKING HER DOG,  DOES NOT RADIATE,  AND IS NOT ACCOMPANIED BY DYSPNEA.  SABRA  2) PAIN IN HER UPPER BACK  THAT OCCURS WITH PROLONGED STANDING AT THE KITCHEN SINK.        HPI STORY CONTI presents for TRANSFER OF CARE FROM DR JIMMY Cy has symptomatic  IDA caused by a PARAESOPHAGEAL HERNIA .  She is receiving  GETTING IRON  INFUSIONS WEEKLY  and notes TRANSIENT IMPROVEMENT IN her chronic fatigue after each infusion,  Her  LAST INFUSION  was August 21.  RECENTLY TRIED GUMMIES WHICH SHE TOLERATED''  She is scheduled to have surgical repair of her hernia on Sept 30.     3) loss of height:  she HAS LOST 2 INCHES OF HEIGHT.  OVER THE PAST 10 YRS.  No history of vertebral fracture   4)  HTN:  HER FORMER PCP RECENTLY ADDED TELMISARTAN  TO HER REGIMEN OF MAXZIDE BUT  THE COMBINATION CAUSED RECURRENT HYPOTENSION WHICH WAS NOTED DURING HER IRON  INUFSIONS SO THE TELMISARTAN WAS SUSPENDED D  Kaiya has a past medical history of Anemia, Chronic bronchitis (HCC), Chronic Cameron ulcer, Chronic cough, Deafness in left ear, GERD (gastroesophageal reflux disease), H/O hemorrhoidectomy, Hiatal hernia, Hyperlipidemia, Hypertension, Iron  deficiency anemia due to chronic blood loss, and SOB (shortness of breath) on exertion.   She has a past surgical history that includes Tubal ligation; Bunionectomy; Breast excisional biopsy (Right,  1978); Breast cyst aspiration (Bilateral); Cataract extraction w/PHACO (Right, 03/30/2019); Cataract extraction w/PHACO (Left, 04/27/2019); Colonoscopy with propofol  (N/A, 11/02/2019); Esophagogastroduodenoscopy (egd) with propofol  (N/A, 11/02/2019); Esophagogastroduodenoscopy (egd) with propofol  (N/A, 08/01/2021); Colonoscopy with propofol  (N/A, 08/01/2021); Givens capsule study (N/A, 08/01/2021); and Esophagogastroduodenoscopy (N/A, 12/04/2023).   Her family history includes Coronary artery disease in her mother; Drug abuse in her brother; Heart attack in her brother, brother, and father; Heart disease in her mother; Hypertension in her mother; Stroke in her brother and father.She reports that she quit smoking about 17 years ago. Her smoking use included cigarettes. She started smoking about 57 years ago. She has a 12 pack-year smoking history. She has been exposed to tobacco smoke. She has never used smokeless tobacco. She reports that she does not drink alcohol and does not use drugs.  Outpatient Medications Prior to Visit  Medication Sig Dispense Refill   acetaminophen  (TYLENOL ) 500 MG tablet Take 500 mg by mouth every 8 (eight) hours as needed.     aspirin  EC 81 MG tablet Take 1 tablet (81 mg total) by mouth daily. Swallow whole.     atorvastatin  (LIPITOR) 40 MG tablet Take 1 tablet (40 mg total) by mouth daily. 90 tablet 3   cilostazol  (PLETAL ) 50 MG tablet TAKE 1 TABLET BY MOUTH TWICE A DAY 180 tablet 0   omeprazole  (PRILOSEC ) 40 MG capsule Take 40 mg by mouth daily.     triamterene -hydrochlorothiazide (MAXZIDE-25) 37.5-25 MG tablet TAKE 1/2 TABLET BY MOUTH DAILY 45  tablet 0   No facility-administered medications prior to visit.    Review of Systems:  Patient denies headache, fevers, malaise, unintentional weight loss, skin rash, eye pain, sinus congestion and sinus pain, sore throat, dysphagia,  hemoptysis , cough, dyspnea, wheezing, chest pain, palpitations, orthopnea, edema, abdominal pain,  nausea, melena, diarrhea, constipation, flank pain, dysuria, hematuria, urinary  Frequency, nocturia, numbness, tingling, seizures,  Focal weakness, Loss of consciousness,  Tremor, insomnia, depression, anxiety, and suicidal ideation.     Objective:  BP 128/76   Pulse 67   Ht 5' 5 (1.651 m)   Wt 126 lb (57.2 kg)   SpO2 95%   BMI 20.97 kg/m   Physical Exam Vitals reviewed.  Constitutional:      General: She is not in acute distress.    Appearance: Normal appearance. She is normal weight. She is not ill-appearing, toxic-appearing or diaphoretic.  HENT:     Head: Normocephalic.  Eyes:     General: No scleral icterus.       Right eye: No discharge.        Left eye: No discharge.     Conjunctiva/sclera: Conjunctivae normal.  Cardiovascular:     Rate and Rhythm: Normal rate and regular rhythm.     Heart sounds: Normal heart sounds.  Pulmonary:     Effort: Pulmonary effort is normal. No respiratory distress.     Breath sounds: Normal breath sounds.  Musculoskeletal:        General: Normal range of motion.  Skin:    General: Skin is warm and dry.  Neurological:     General: No focal deficit present.     Mental Status: She is alert and oriented to person, place, and time. Mental status is at baseline.  Psychiatric:        Mood and Affect: Mood normal.        Behavior: Behavior normal.        Thought Content: Thought content normal.        Judgment: Judgment normal.     Assessment & Plan:  Essential hypertension, benign Assessment & Plan: She did not toleratie the addition of telmisartan to previous regimen of maxzide due to hypotension,  and gfr dropped with repeat BMET per review of June labs .  She has been taking only maxzide since late June.  Repeat BMET and UaCr have been drawn today for clarificaiton of need for ARB    Orders: -     Basic metabolic panel with GFR -     Microalbumin / creatinine urine ratio  Chronic bilateral thoracic back pain Assessment &  Plan: Likely due to vertebral disk disease,  but given her history of tobacco abuse and PAD,  and calcified aorta on today's plain films,  will need to evaluate prior to surgery to rule out AA . Will discuss with cardiology   Orders: -     DG Thoracic Spine W/Swimmers; Future  History of tobacco use disorder  Peripheral arterial occlusive disease (HCC) Assessment & Plan: SHE IS CURRENTLY ASYMPTOMATIC AND TOLERATING PLETAL ,  SURVEILLANCE WITH ANNUAL LOWER EXTREMITY ARTERIAL DUPLEX STUDY BY [facility] WITH MOST RECENT STUDY AUGUST 2014 NOTING  Right: 50-74% stenosis noted in the superficial femoral artery and/or popliteal artery AND ON THE Left: Total occlusion noted in the superficial femoral artery. 30-49% stenosis noted in the superficial femoral artery and/or popliteal artery.     Hiatal hernia with gastroesophageal reflux Assessment & Plan: She is symptomatic after eating and is scheduled  for corrective surgery on Sept 30    Iron  deficiency anemia, unspecified iron  deficiency anemia type Assessment & Plan: Recurrent, with no source of blood loss found by 2023 GI workup and 2025 repeat EGD.  She continues to require IV iron  infusions to maintain stores.  She has a large hiatal hernia and  CKD    Stage 3a chronic kidney disease (HCC) Assessment & Plan: No prior nephrology workup.     Atypical chest pain Assessment & Plan: HER episodes  are non exertional. Given her upcoming hernia surgery will recommend cardiology clearance as her last stress test was in 2017     I personally spent a total of 56 minutes in the care of the patient today including getting/reviewing separately obtained history, performing a medically appropriate exam/evaluation, counseling and educating, placing orders, referring and communicating with other health care professionals, documenting clinical information in the EHR, and independently interpreting results.  Follow-up: No follow-ups on  file.  Verneita LITTIE Kettering, MD

## 2023-12-11 ENCOUNTER — Ambulatory Visit: Payer: Self-pay | Admitting: Internal Medicine

## 2023-12-11 ENCOUNTER — Inpatient Hospital Stay

## 2023-12-11 ENCOUNTER — Telehealth: Payer: Self-pay | Admitting: *Deleted

## 2023-12-11 VITALS — BP 115/66 | HR 75 | Temp 97.1°F | Resp 18

## 2023-12-11 DIAGNOSIS — Z01818 Encounter for other preprocedural examination: Secondary | ICD-10-CM

## 2023-12-11 DIAGNOSIS — Z87891 Personal history of nicotine dependence: Secondary | ICD-10-CM | POA: Insufficient documentation

## 2023-12-11 DIAGNOSIS — D509 Iron deficiency anemia, unspecified: Secondary | ICD-10-CM

## 2023-12-11 DIAGNOSIS — G8929 Other chronic pain: Secondary | ICD-10-CM | POA: Insufficient documentation

## 2023-12-11 LAB — BASIC METABOLIC PANEL WITH GFR
BUN: 25 mg/dL — ABNORMAL HIGH (ref 6–23)
CO2: 27 meq/L (ref 19–32)
Calcium: 9.7 mg/dL (ref 8.4–10.5)
Chloride: 104 meq/L (ref 96–112)
Creatinine, Ser: 1.06 mg/dL (ref 0.40–1.20)
GFR: 50.14 mL/min — ABNORMAL LOW (ref >60.00)
Glucose, Bld: 85 mg/dL (ref 70–99)
Potassium: 3.7 meq/L (ref 3.5–5.1)
Sodium: 141 meq/L (ref 135–145)

## 2023-12-11 LAB — MICROALBUMIN / CREATININE URINE RATIO
Creatinine,U: 129.2 mg/dL
Microalb Creat Ratio: 8.4 mg/g (ref 0.0–30.0)
Microalb, Ur: 1.1 mg/dL (ref 0.0–1.9)

## 2023-12-11 MED ORDER — IRON SUCROSE 20 MG/ML IV SOLN
200.0000 mg | Freq: Once | INTRAVENOUS | Status: AC
  Administered 2023-12-11: 200 mg via INTRAVENOUS
  Filled 2023-12-11: qty 10

## 2023-12-11 NOTE — Progress Notes (Signed)
 Outpatient Surgical Follow Up  12/11/2023  Meghan Moody is an 78 y.o. female.   Chief Complaint  Patient presents with   Follow-up    HPI:  Meghan Moody is a 78 y.o. female seen in F/U for a paraesophageal hernia.  She reports.  History of significant reflux as well as some some dysphagia for certain meals including rice and [place].  She did complete a repeat EGD by Dr. Tamara evidence of paraesophageal hernia.  Please note that I personally reviewed.  She did also completed a CT and Swallow that I have also personally reviewed showing giant hiatal hernia w majority of stomach within mediastinum.   She is able to perform more than 4 METS of activity without any shortness of or chest pain. Her only abdominal operation is tubal ligation.  She does have a history of chronic anemia with a last hemoglobin of 12.2, she does have a history of chronic kidney disease with a baseline creatinine of 1.25    Past Medical History:  Diagnosis Date   Anemia    Chronic bronchitis (HCC)    Chronic Cameron ulcer    Chronic cough    Deafness in left ear    GERD (gastroesophageal reflux disease)    H/O hemorrhoidectomy    Hiatal hernia    Hyperlipidemia    Hypertension    Iron  deficiency anemia due to chronic blood loss    SOB (shortness of breath) on exertion     Past Surgical History:  Procedure Laterality Date   BREAST CYST ASPIRATION Bilateral    neg   BREAST EXCISIONAL BIOPSY Right 1978   neg   BUNIONECTOMY     CATARACT EXTRACTION W/PHACO Right 03/30/2019   Procedure: CATARACT EXTRACTION PHACO AND INTRAOCULAR LENS PLACEMENT (IOC) RIGHT, 1.23, 00:20.8;  Surgeon: Myrna Adine Anes, MD;  Location: [facility];  Service: Ophthalmology;  Laterality: Right;   CATARACT EXTRACTION W/PHACO Left 04/27/2019   Procedure: CATARACT EXTRACTION PHACO AND INTRAOCULAR LENS PLACEMENT (IOC) LEFT 1.46  00:20.3;  Surgeon: Myrna Adine Anes, MD;  Location: [facility];  Service:  Ophthalmology;  Laterality: Left;   COLONOSCOPY WITH PROPOFOL  N/A 11/02/2019   Procedure: COLONOSCOPY WITH PROPOFOL ;  Surgeon: Unk Corinn Skiff, MD;  Location: [facility] ENDOSCOPY;  Service: Gastroenterology;  Laterality: N/A;   COLONOSCOPY WITH PROPOFOL  N/A 08/01/2021   Procedure: COLONOSCOPY WITH PROPOFOL ;  Surgeon: Unk Corinn Skiff, MD;  Location[facility] END[facility]COPY;  Service: Gastroenterology;  Laterality: N/A;   ESOPHAGOGASTRODUODENOSCOPY N/A 12/04/2023   Procedure: EGD (ESOPHAGOGASTRODUODENOSCOPY);  Surgeon: Unk Corinn Skiff, MD;  Location: [facility] ENDOSCOPY;  Service: Gastroenterology;  Laterality: N/A;   ESOPHAGOGASTRODUODENOSCOPY (EGD) WITH PROPOFOL  N/A 11/02/2019   Procedure: ESOPHAGOGASTRODUODENOSCOPY (EGD) WITH PROPOFOL ;  Surgeon: Unk Corinn Skiff, MD;  Location: [facility] ENDOSCOPY;  Service: Gastroenterology;  Laterality: N/A;   ESOPHAGOGASTRODUODENOSCOPY (EGD) WITH PROPOFOL  N/A 08/01/2021   Procedure: ESOPHAGOGASTRODUODENOSCOPY (EGD) WITH PROPOFOL ;  Surgeon: Unk Corinn Skiff, MD;  Location: [facility] ENDOSCOPY;  Service: Gastroenterology;  Laterality: N/A;  with Capsule placement   GIVENS CAPSULE STUDY N/A 08/01/2021   Procedure: GIVENS CAPSULE STUDY;  Surgeon: Unk Corinn Skiff, MD;  Location: [facility] ENDOSCOPY;  Service: Gastroenterology;  Laterality: N/A;   TUBAL LIGATION      Family History  Problem Relation Age of Onset   Coronary artery disease Mother    Heart disease Mother        Heart attack, 5 to 7 stents over 20 years passed 2020   Hypertension Mother    Stroke Father  Heart attack Father        Stroke passed 2005   Drug abuse Brother    Heart attack Brother        Stroke passed 2016   Stroke Brother    Heart attack Brother        Stroke Afib last event 6/23 currently in skilled nursing    Social History:  reports that she quit smoking about 17 years ago. Her smoking use included cigarettes. She started smoking about 57 years ago. She has a 12 pack-year smoking history.  She has been exposed to tobacco smoke. She has never used smokeless tobacco. She reports that she does not drink alcohol and does not use drugs.  Allergies: No Known Allergies  Medications reviewed.    ROS Full ROS performed and is otherwise negative other than what is stated in HPI   BP (!) 140/72   Pulse 69   Ht 5' 5 (1.651 m)   Wt 124 lb 12.8 oz (56.6 kg)   SpO2 98%   BMI 20.77 kg/m   Physical Exam  CONSTITUTIONAL: NAD. EYES: Pupils are equal, round, Sclera are non-icteric. EARS, NOSE, MOUTH AND THROAT: The oropharynx is clear. The oral mucosa is pink and moist. Hearing is intact to voice. LYMPH NODES:  Lymph nodes in the neck are normal. RESPIRATORY:  Lungs are clear. There is normal respiratory effort, with equal breath sounds bilaterally, and without pathologic use of accessory muscles. CARDIOVASCULAR: Heart is regular without murmurs, gallops, or rubs. GI: The abdomen is  soft, nontender, and nondistended. There are no palpable masses. There is no hepatosplenomegaly. There are normal bowel soundsGU: Rectal deferred.   MUSCULOSKELETAL: Normal muscle strength and tone. No cyanosis or edema.   SKIN: Turgor is good and there are no pathologic skin lesions or ulcers. NEUROLOGIC: Motor and sensation is grossly normal. Cranial nerves are grossly intact. PSYCH:  Oriented to person, place and time. Affect is normal.   Data Reviewed I have personally reviewed the patient's imaging, laboratory findings and medical records.     Assessment/Plan 46 female with type III giant paraesophageal hernia and significant symptoms including reflux and dysphagia not responsive to medical therapy.. I  Discussed with the patient in detail about her disease process.  I do think that given the size of the hernia and sxs there is an indication for repair and she would be a good candidate for robotic approach.  We talked regarding her disease process and role for surgical intervention.  I did talk to  her specifically about the surgery and what this will entail the risks, the benefits and the possible complications including but not limited to: Bleeding, infection injury to bowel or esophagus, bloating. She wishes to do it in about 6-8 weeks. I will see her next months to make sure we have appropriate arrangements preoperatively and to answer any other questions Plan Rob Paraeso w partial fundoplicaiton We also talked about diet modifications following surgery.  She seems to understand this. I personally spent a total of 40 minutes in the care of the patient today including performing a medically appropriate exam/evaluation, counseling and educating, placing orders, referring and communicating with other health care professionals, documenting clinical information in the EHR, independently interpreting and reviewing images studies and coordinating care.     Laneta Luna, MD [facility] Sur[facility]

## 2023-12-11 NOTE — Assessment & Plan Note (Addendum)
 SHE IS CURRENTLY ASYMPTOMATIC AND TOLERATING PLETAL ,  SURVEILLANCE WITH ANNUAL LOWER EXTREMITY ARTERIAL DUPLEX STUDY BY [facility] WITH MOST RECENT STUDY AUGUST 2014 NOTING  Right: 50-74% stenosis noted in the superficial femoral artery and/or popliteal artery AND ON THE Left: Total occlusion noted in the superficial femoral artery. 30-49% stenosis noted in the superficial femoral artery and/or popliteal artery.

## 2023-12-11 NOTE — Patient Instructions (Signed)

## 2023-12-11 NOTE — Assessment & Plan Note (Addendum)
 Recurrent, with no source of blood loss found by 2023 GI workup and 2025 repeat EGD.  She continues to require IV iron  infusions to maintain stores.  She has a large hiatal hernia and  CKD

## 2023-12-11 NOTE — Telephone Encounter (Signed)
 Patient has been advised of all instructions. Lexi scheduled 12/18/23 and appointment with Dr. Darron on 12/19/23.   Per Dr. Darron Planas please schedule this patient for a [facility] for preop evaluation. Follow-up after.   Lexiscan ordered and message sent to scheduling  Your provider has ordered a Lexiscan Myoview Stress test. This will take place at [facility]. [facility] entrance. The volunteers at the first desk will direct you where to go.  [facility] MYOVIEW  Your provider has ordered a Stress Test with nuclear imaging. The purpose of this test is to evaluate the blood supply to your heart muscle. This procedure is referred to as a Non-Invasive Stress Test. This is because other than having an IV started in your vein, nothing is inserted or invades your body. Cardiac stress tests are done to find areas of poor blood flow to the heart by determining the extent of coronary artery disease (CAD). Some patients exercise on a treadmill, which naturally increases the blood flow to your heart, while others who are unable to walk on a treadmill due to physical limitations will have a pharmacologic/chemical stress agent called Lexiscan . This medicine will mimic walking on a treadmill by temporarily increasing your coronary blood flow.   Please note: these test may take anywhere between 2-4 hours to complete  How to prepare for your Myoview test:  Nothing to eat for 6 hours prior to the test No caffeine for 24 hours prior to test No smoking 24 hours prior to test. Your medication may be taken with water.  If your doctor stopped a medication because of this test, do not take that medication. Ladies, please do not wear dresses.  Skirts or pants are appropriate. Please wear a short sleeve shirt. No perfume, cologne or lotion. Wear comfortable walking shoes. No heels!   PLEASE NOTIFY THE OFFICE AT LEAST 24 HOURS IN ADVANCE IF YOU ARE UNABLE TO KEEP YOUR APPOINTMENT.   [phone] AND  PLEASE NOTIFY NUCLEAR MEDICINE AT [facility] AT LEAST 24 HOURS IN ADVANCE IF YOU ARE UNABLE TO KEEP YOUR APPOINTMENT. [phone]

## 2023-12-11 NOTE — Assessment & Plan Note (Addendum)
 Likely due to vertebral disk disease,  but given her history of tobacco abuse and PAD,  and calcified aorta on today's plain films,  will need to evaluate prior to surgery to rule out AA . Will discuss with cardiology

## 2023-12-11 NOTE — Assessment & Plan Note (Signed)
 She is symptomatic after eating and is scheduled for corrective surgery on Sept 30

## 2023-12-11 NOTE — Assessment & Plan Note (Signed)
 No prior nephrology workup.

## 2023-12-11 NOTE — Assessment & Plan Note (Signed)
 HER episodes  are non exertional. Given her upcoming hernia surgery will recommend cardiology clearance as her last stress test was in 2017

## 2023-12-12 ENCOUNTER — Telehealth: Payer: Self-pay | Admitting: Surgery

## 2023-12-12 NOTE — Telephone Encounter (Signed)
 Patient has been advised of Pre-Admission date/time, and Surgery date at [facility].  Surgery Date: 01/14/24 Preadmission Testing Date: Preadmissions to call patient with appointment.    Patient informed of the scheduling process and surgery information given at time of office visit.  Patient has been made aware to call [phone], between 1-3:00pm the day before surgery, to find out what time to arrive for surgery.

## 2023-12-18 ENCOUNTER — Ambulatory Visit
Admission: RE | Admit: 2023-12-18 | Discharge: 2023-12-18 | Disposition: A | Discharge status: Home / Self Care | Source: Ambulatory Visit | Attending: Cardiovascular Disease | Admitting: Cardiovascular Disease

## 2023-12-18 DIAGNOSIS — I700 Atherosclerosis of aorta: Secondary | ICD-10-CM | POA: Diagnosis not present

## 2023-12-18 DIAGNOSIS — Z01818 Encounter for other preprocedural examination: Secondary | ICD-10-CM | POA: Insufficient documentation

## 2023-12-18 DIAGNOSIS — I2510 Atherosclerotic heart disease of native coronary artery without angina pectoris: Secondary | ICD-10-CM | POA: Insufficient documentation

## 2023-12-18 MED ORDER — TECHNETIUM TC 99M TETROFOSMIN IV KIT
31.6000 | PACK | Freq: Once | INTRAVENOUS | Status: AC | PRN
  Administered 2023-12-18: 31.6 via INTRAVENOUS

## 2023-12-18 MED ORDER — TECHNETIUM TC 99M TETROFOSMIN IV KIT
10.0000 | PACK | Freq: Once | INTRAVENOUS | Status: AC | PRN
  Administered 2023-12-18: 10.85 via INTRAVENOUS

## 2023-12-18 MED ORDER — REGADENOSON 0.4 MG/5ML IV SOLN
0.4000 mg | Freq: Once | INTRAVENOUS | Status: AC
  Administered 2023-12-18: 0.4 mg via INTRAVENOUS

## 2023-12-19 ENCOUNTER — Ambulatory Visit: Admitting: Cardiovascular Disease

## 2023-12-19 ENCOUNTER — Encounter: Payer: Self-pay | Admitting: Oncology

## 2023-12-20 ENCOUNTER — Encounter: Payer: Self-pay | Admitting: Cardiovascular Disease

## 2023-12-20 ENCOUNTER — Ambulatory Visit: Payer: Self-pay | Admitting: Cardiovascular Disease

## 2023-12-20 ENCOUNTER — Ambulatory Visit: Attending: Cardiovascular Disease | Admitting: Cardiovascular Disease

## 2023-12-20 VITALS — BP 120/74 | HR 80 | Ht 65.0 in | Wt 129.1 lb

## 2023-12-20 DIAGNOSIS — I739 Peripheral vascular disease, unspecified: Secondary | ICD-10-CM

## 2023-12-20 DIAGNOSIS — I10 Essential (primary) hypertension: Secondary | ICD-10-CM | POA: Diagnosis not present

## 2023-12-20 DIAGNOSIS — E785 Hyperlipidemia, unspecified: Secondary | ICD-10-CM | POA: Diagnosis not present

## 2023-12-20 DIAGNOSIS — Z01810 Encounter for preprocedural cardiovascular examination: Secondary | ICD-10-CM | POA: Diagnosis not present

## 2023-12-20 LAB — NM MYOCAR MULTI W/SPECT W/WALL MOTION / EF
LV dias vol: 57 mL (ref 46–106)
LV sys vol: 25 mL (ref 3.8–5.2)
Nuc Stress EF: 56 %
Peak HR: 107 {beats}/min
Rest HR: 58 {beats}/min
Rest Nuclear Isotope Dose: 10.9 mCi
SDS: 0
SRS: 6
SSS: 1
Stress Nuclear Isotope Dose: 31.6 mCi
TID: 0.97

## 2023-12-20 MED ORDER — TRIAMTERENE-HCTZ 37.5-25 MG PO TABS: 0.5000 | ORAL_TABLET | Freq: Every day | ORAL | 3 refills | Status: AC

## 2023-12-20 MED ORDER — CILOSTAZOL 50 MG PO TABS: 50.0000 mg | ORAL_TABLET | Freq: Two times a day (BID) | ORAL | 3 refills | Status: AC

## 2023-12-20 MED ORDER — ATORVASTATIN CALCIUM 40 MG PO TABS: 40.0000 mg | ORAL_TABLET | Freq: Every day | ORAL | 3 refills | Status: AC

## 2023-12-20 NOTE — Patient Instructions (Signed)
 Medication Instructions:  HOLD the Aspirin  and Pletal  5 days prior to the procedure  *If you need a refill on your cardiac medications before your next appointment, please call your pharmacy*  Lab Work: None ordered If you have labs (blood work) drawn today and your tests are completely normal, you will receive your results only by: MyChart Message (if you have MyChart) OR A paper copy in the mail If you have any lab test that is abnormal or we need to change your treatment, we will call you to review the results.  Testing/Procedures: None ordered  Follow-Up: At [facility], you and your health needs are our priority.  As part of our continuing mission to provide you with exceptional heart care, our providers are all part of one team.  This team includes your primary Cardiologist (physician) and Advanced Practice Providers or APPs (Physician Assistants and Nurse Practitioners) who all work together to provide you with the care you need, when you need it.  Your next appointment:   12 month(s)  Provider:   You may see Deatrice Cage, MD or one of the following Advanced Practice Providers on your designated Care Team:   Lonni Meager, NP Lesley Maffucci, PA-C Bernardino Bring, PA-C Cadence Clyde, PA-C Tylene Lunch, NP Barnie Hila, NP    We recommend signing up for the patient portal called MyChart.  Sign up information is provided on this After Visit Summary.  MyChart is used to connect with patients for Virtual Visits (Telemedicine).  Patients are able to view lab/test results, encounter notes, upcoming appointments, etc.  Non-urgent messages can be sent to your provider as well.   To learn more about what you can do with MyChart, go to [email or web address].

## 2023-12-20 NOTE — Progress Notes (Signed)
 Cardiology Office Note   Date:  12/20/2023   ID:  DOUGLAS ROOKS, DOB Apr 09, 1946, MRN [id number]  PCP:  Marylynn Verneita CROME, MD  Cardiologist:   Deatrice Cage, MD   Chief Complaint  Patient presents with   Follow-up    12 month f/u no complaints today. Meds reviewed verbally with pt.      History of Present Illness: Meghan Moody is a 78 y.o. female who is here today for a follow-up visit regarding peripheral arterial disease.   She has past medical history of essential hypertension, hyperlipidemia, GERD/hiatal hernia, previous tobacco use and anemia.  She quit smoking in 2008 . She is not diabetic. She is followed for peripheral arterial disease with bilateral calf claudication that is worse on the left side.  Her left SFA is chronically occluded with collaterals. Most recent arterial Doppler in August 2024 showed improvement in ABI on the right side and stable on the left side at 0.64.    She has been having issues with iron  deficiency anemia requiring iron  infusions weekly.  She is scheduled for surgical repair of her paraesophageal hernia later this month.  She recently saw Dr. Marylynn and reported intermittent atypical chest pain.  She underwent a Lexiscan  Myoview  2 days ago which showed no evidence of ischemia with normal ejection fraction.  She had CT abdomen with contrast in August which was personally reviewed by me and showed aortic calcifications but no evidence of aortic aneurysm.   Past Medical History:  Diagnosis Date   Anemia    Chronic bronchitis (HCC)    Chronic Cameron ulcer    Chronic cough    Deafness in left ear    GERD (gastroesophageal reflux disease)    H/O hemorrhoidectomy    Hiatal hernia    Hyperlipidemia    Hypertension    Iron  deficiency anemia due to chronic blood loss    SOB (shortness of breath) on exertion     Past Surgical History:  Procedure Laterality Date   BREAST CYST ASPIRATION Bilateral    neg   BREAST EXCISIONAL BIOPSY Right 1978    neg   BUNIONECTOMY     CATARACT EXTRACTION W/PHACO Right 03/30/2019   Procedure: CATARACT EXTRACTION PHACO AND INTRAOCULAR LENS PLACEMENT (IOC) RIGHT, 1.23, 00:20.8;  Surgeon: Myrna Adine Anes, MD;  Location: [facility];  Service: Ophthalmology;  Laterality: Right;   CATARACT EXTRACTION W/PHACO Left 04/27/2019   Procedure: CATARACT EXTRACTION PHACO AND INTRAOCULAR LENS PLACEMENT (IOC) LEFT 1.46  00:20.3;  Surgeon: Myrna Adine Anes, MD;  Location: [facility];  Service: Ophthalmology;  Laterality: Left;   COLONOSCOPY WITH PROPOFOL  N/A 11/02/2019   Procedure: COLONOSCOPY WITH PROPOFOL ;  Surgeon: Unk Corinn Skiff, MD;  Location: [facility] ENDOSCOPY;  Service: Gastroenterology;  Laterality: N/A;   COLONOSCOPY WITH PROPOFOL  N/A 08/01/2021   Procedure: COLONOSCOPY WITH PROPOFOL ;  Surgeon: Unk Corinn Skiff, MD;  Location: [facility] ENDOSCOPY;  Service: Gastroenterology;  Laterality: N/A;   ESOPHAGOGASTRODUODENOSCOPY N/A 12/04/2023   Procedure: EGD (ESOPHAGOGASTRODUODENOSCOPY);  Surgeon: Unk Corinn Skiff, MD;  Location: [facility] ENDOSCOPY;  Service: Gastroenterology;  Laterality: N/A;   ESOPHAGOGASTRODUODENOSCOPY (EGD) WITH PROPOFOL  N/A 11/02/2019   Procedure: ESOPHAGOGASTRODUODENOSCOPY (EGD) WITH PROPOFOL ;  Surgeon: Unk Corinn Skiff, MD;  Location: [facility] ENDOSCOPY;  Service: Gastroenterology;  Laterality: N/A;   ESOPHAGOGASTRODUODENOSCOPY (EGD) WITH PROPOFOL  N/A 08/01/2021   Procedure: ESOPHAGOGASTRODUODENOSCOPY (EGD) WITH PROPOFOL ;  Surgeon: Unk Corinn Skiff, MD;  Location: [facility] ENDOSCOPY;  Service: Gastroenterology;  Laterality: N/A;  with Capsule placement  GIVENS CAPSULE STUDY N/A 08/01/2021   Procedure: GIVENS CAPSULE STUDY;  Surgeon: Unk Corinn Skiff, MD;  Location: [facility] ENDOSCOPY;  Service: Gastroenterology;  Laterality: N/A;   TUBAL LIGATION       Current Outpatient Medications  Medication Sig Dispense Refill   acetaminophen  (TYLENOL ) 500 MG tablet Take 500 mg by mouth  every 8 (eight) hours as needed.     aspirin  EC 81 MG tablet Take 1 tablet (81 mg total) by mouth daily. Swallow whole.     omeprazole  (PRILOSEC ) 40 MG capsule Take 40 mg by mouth daily.     atorvastatin  (LIPITOR) 40 MG tablet Take 1 tablet (40 mg total) by mouth daily. 90 tablet 3   cilostazol  (PLETAL ) 50 MG tablet Take 1 tablet (50 mg total) by mouth 2 (two) times daily. 180 tablet 3   triamterene -hydrochlorothiazide (MAXZIDE-25) 37.5-25 MG tablet Take 0.5 tablets by mouth daily. 45 tablet 3   No current facility-administered medications for this visit.    Allergies:   Patient has no known allergies.    Social History:  The patient  reports that she quit smoking about 17 years ago. Her smoking use included cigarettes. She started smoking about 57 years ago. She has a 12 pack-year smoking history. She has been exposed to tobacco smoke. She has never used smokeless tobacco. She reports that she does not drink alcohol and does not use drugs.   Family History:  The patient's family history includes Coronary artery disease in her mother; Drug abuse in her brother; Heart attack in her brother, brother, and father; Heart disease in her mother; Hypertension in her mother; Stroke in her brother and father.    ROS:  Please see the history of present illness.   Otherwise, review of systems are positive for none.   All other systems are reviewed and negative.    PHYSICAL EXAM: VS:  BP 120/74 (BP Location: Left Arm, Patient Position: Sitting, Cuff Size: Normal)   Pulse 80   Ht 5' 5 (1.651 m)   Wt 129 lb 2 oz (58.6 kg)   SpO2 97%   BMI 21.49 kg/m  , BMI Body mass index is 21.49 kg/m. GEN: Well nourished, well developed, in no acute distress  HEENT: normal  Neck: no JVD, carotid bruits, or masses Cardiac: RRR; no murmurs, rubs, or gallops,no edema  Respiratory:  clear to auscultation bilaterally, normal work of breathing GI: soft, nontender, nondistended, + BS MS: no deformity or atrophy   Skin: warm and dry, no rash Neuro:  Strength and sensation are intact Psych: euthymic mood, full affect    EKG:  EKG is ordered today. EKG showed: Normal sinus rhythm Normal ECG When compared with ECG of 09-Oct-2022 10:31, No significant change was found    Recent Labs: 08/29/2023: ALT 20; TSH 1.37 09/04/2023: Hemoglobin 8.3; Platelet Count 399 12/10/2023: BUN 25; Creatinine, Ser 1.06; Potassium 3.7; Sodium 141    Lipid Panel    Component Value Date/Time   CHOL 151 08/29/2023 1208   TRIG 80.0 08/29/2023 1208   HDL 61.60 08/29/2023 1208   CHOLHDL 2 08/29/2023 1208   VLDL 16.0 08/29/2023 1208   LDLCALC 74 08/29/2023 1208   LDLDIRECT 163.1 11/10/2012 1249      Wt Readings from Last 3 Encounters:  12/20/23 129 lb 2 oz (58.6 kg)  12/10/23 126 lb (57.2 kg)  12/09/23 124 lb 12.8 oz (56.6 kg)          10/24/2021    4:42 PM  PAD  Screen  Previous PAD dx? No  Previous surgical procedure? No  Pain with walking? Yes  Subsides with rest? Yes  Feet/toe relief with dangling? No  Painful, non-healing ulcers? No  Extremities discolored? No      ASSESSMENT AND PLAN:  1.  Peripheral arterial disease: Left calf claudication due to occluded left SFA.  Her symptoms improved with cilostazol  and she currently has mild symptoms that are not lifestyle limiting.  Thus, no indication for revascularization.    2.  Hyperlipidemia: Most recent lipid profile showed an LDL of 74.  Continue atorvastatin  40 mg daily.  3.  Essential hypertension: Blood pressure is controlled.  4.  Preop cardiovascular evaluation for paraesophageal hernia surgery: She has good functional capacity and normal EKG.  She underwent a Lexiscan  Myoview  that showed no evidence of ischemia with normal ejection fraction.  She can proceed at an overall low risk from a cardiac standpoint. Hold aspirin  and cilostazol  5 days before the surgery.  5.  Aortic calcifications: I reviewed her recent CT abdomen which showed no  evidence of aortic aneurysm or obstructive aortoiliac disease.    Disposition:   FU with me in 12 months  Signed,  Deatrice Cage, MD  12/20/2023 12:17 PM    [facility]

## 2023-12-30 ENCOUNTER — Encounter: Payer: Self-pay | Admitting: Surgery

## 2023-12-30 ENCOUNTER — Ambulatory Visit (INDEPENDENT_AMBULATORY_CARE_PROVIDER_SITE_OTHER): Admitting: Surgery

## 2023-12-30 VITALS — BP 139/81 | HR 73 | Temp 98.0°F | Ht 65.0 in | Wt 128.8 lb

## 2023-12-30 DIAGNOSIS — K449 Diaphragmatic hernia without obstruction or gangrene: Secondary | ICD-10-CM | POA: Diagnosis not present

## 2023-12-30 DIAGNOSIS — K219 Gastro-esophageal reflux disease without esophagitis: Secondary | ICD-10-CM | POA: Diagnosis not present

## 2023-12-30 DIAGNOSIS — R1310 Dysphagia, unspecified: Secondary | ICD-10-CM

## 2023-12-30 NOTE — Patient Instructions (Signed)
 Surgery to Stop Reflux (Laparoscopic Nissen Fundoplication): What to Expect  Laparoscopic Nissen fundoplication is a surgery to help with symptoms of gastroesophageal reflux disease (GERD). In GERD, stomach contents and acid back up into your esophagus. Your esophagus is the part of your body that moves food from your mouth to your stomach. Normally, the muscle between your stomach and esophagus keeps stomach contents and acid in your stomach. If you have GERD, that muscle doesn't work like it should. This means stomach contents and acid may flow back up (reflux). You may need this surgery if other treatments for GERD haven't helped. In this surgery, the upper part of your stomach is wrapped around the lower end of your esophagus and stitched together. Tell a health care provider about: Any allergies you have. All medicines you take. These include vitamins, herbs, eye drops, and creams. Any problems you or family members have had with anesthesia. Any bleeding problems you have. Any surgeries you've had. Any medical problems you have. Whether you're pregnant or may be pregnant. What are the risks? Your health care provider will talk with you about risks. These may include: Infection. Bleeding or blood clots. Damage to other structures or organs. These include the stomach, esophagus, lungs, liver, and spleen. Trouble swallowing. Not being able to burp. This can cause bloating. Allergic reactions to medicines. Other risks may include: A tear in the stitches used for the wrap. A hole in your lung. A hole in your esophagus or stomach. What happens before? When to stop eating and drinking Eat and drink only as you've been told. You may be told this: 8 hours before your surgery Stop eating most foods. Do not eat meat, fried foods, or fatty foods. Eat only light foods, such as toast or crackers. All liquids are OK except energy drinks and alcohol. 6 hours before your surgery Stop  eating. Drink only clear liquids, such as water, clear fruit juice, black coffee, plain tea, and sports drinks. Do not drink energy drinks or alcohol. 2 hours before your surgery Stop drinking all liquids. You may be allowed to take medicines with small sips of water. If you do not eat and drink as told, your surgery may be delayed or canceled. Medicines Ask about changing or stopping: Any medicines you take. Any vitamins, herbs, or supplements you take. Do not take aspirin  or ibuprofen  unless you're told to. Tests You'll need tests. These may include: Barium swallow X-rays. These are done to take pictures of your throat, stomach, and small intestine. An exam using a scope put down your esophagus into your stomach. A test to measures the pressure in your throat when you swallow. A test using a probe to look for acid in your esophagus. Surgery safety For your safety, you may: Need to wash your skin with a soap that kills germs. Get antibiotics. Have your surgery site marked. Have hair removed at the surgery site. General instructions Do not smoke, vape, or use nicotine or tobacco for at least 4 weeks before the surgery. Ask if you'll be staying overnight in the hospital. If you'll be going home right after the surgery, plan to have a responsible adult: Drive you home from the hospital or clinic. You won't be allowed to drive. Stay with you for the time you're told. What happens during laparoscopic Nissen fundoplication? An IV will be put into a vein in your hand or arm. You may be given: A sedative to help you relax. Anesthesia to keep you from feeling  pain. A small cut will be made in your belly. A thin, lighted tube called a laparoscope will be put into the cut. The tube has a camera on the end of it to help your surgeon see inside your stomach. A few more small cuts will be made in your belly. Tools will be put through the cuts. The upper part of your stomach will be wrapped  and stitched around the lower end of your esophagus. This will strengthen the muscle and prevent reflux. If you have a hiatal hernia, it will also be fixed. A hernia is a bulge of tissue from inside your belly that pushes through a weak area of your belly. The tools will be taken out. The cuts will be closed with stitches. A bandage will be put over the cuts. These steps may vary. Ask what you can expect. What happens after? You'll be watched closely until you leave. This includes checking your pain level, blood pressure, heart rate, and breathing rate. Your IV will be kept in until you can drink on your own. This information is not intended to replace advice given to you by your health care provider. Make sure you discuss any questions you have with your health care provider. Document Revised: 11/22/2022 Document Reviewed: 11/22/2022 Elsevier Patient Education  2024 [employer].

## 2023-12-31 ENCOUNTER — Encounter: Payer: Self-pay | Admitting: Pharmacist

## 2023-12-31 NOTE — Progress Notes (Signed)
 Outpatient Surgical Follow Up    Meghan Moody is an 78 y.o. female.   Chief Complaint  Patient presents with   Follow-up    Hiatal hernia    HPI: 78 y.o. female seen in F/U for a paraesophageal hernia  History of significant reflux as well as some some dysphagia for certain meals including rice and [place].  She did complete a repeat EGD by Dr. Unk showing evidence of paraesophageal hernia.  Please note that I personally reviewed.  She did also completed a CT and Swallow that I have also personally reviewed showing giant hiatal hernia w majority of stomach within mediastinum.   She is able to perform more than 4 METS of activity without any shortness of or chest pain. Her only abdominal operation is tubal ligation.  She does have a history of chronic anemia with a last hemoglobin of 12.2, she does have a history of chronic kidney disease with a baseline creatinine of 1.25   SHe is now ready to move forward with the operation. She completed stress test w ef 59% and no reversible ischemia   Past Medical History:  Diagnosis Date   Anemia    Chronic bronchitis (HCC)    Chronic Cameron ulcer    Chronic cough    Deafness in left ear    GERD (gastroesophageal reflux disease)    H/O hemorrhoidectomy    Hiatal hernia    Hyperlipidemia    Hypertension    Iron  deficiency anemia due to chronic blood loss    SOB (shortness of breath) on exertion     Past Surgical History:  Procedure Laterality Date   BREAST CYST ASPIRATION Bilateral    neg   BREAST EXCISIONAL BIOPSY Right 1978   neg   BUNIONECTOMY     CATARACT EXTRACTION W/PHACO Right 03/30/2019   Procedure: CATARACT EXTRACTION PHACO AND INTRAOCULAR LENS PLACEMENT (IOC) RIGHT, 1.23, 00:20.8;  Surgeon: Myrna Adine Anes, MD;  Location: [facility]ERY [facility];  Service: Ophthalmology;  Laterality: Right;   CATARACT EXTRACTION W/PHACO Left 04/27/2019   Procedure: CATARACT EXTRACTION PHACO AND INTRAOCULAR LENS PLACEMENT (IOC) LEFT 1.46   00:20.3;  Surgeon: Myrna Adine Anes, MD;  Location: [facility] SURGERY CNTR;  Service: Ophthalmology;  Laterality: Left;   COLONOSCOPY WITH PROPOFOL  N/A 11/02/2019   Procedure: COLONOSCOPY WITH PROPOFOL ;  Surgeon: Unk Corinn Skiff, MD;  Location: [facility] ENDOSCOPY;  Service: Gastroenterology;  Laterality: N/A;   COLONOSCOPY WITH PROPOFOL  N/A 08/01/2021   Procedure: COLONOSCOPY WITH PROPOFOL ;  Surgeon: Unk Corinn Skiff, MD;  Location: [facility] ENDOSCOPY;  Service: Gastroenterology;  Laterality: N/A;   ESOPHAGOGASTRODUODENOSCOPY N/A 12/04/2023   Procedure: EGD (ESOPHAGOGASTRODUODENOSCOPY);  Surgeon: Unk Corinn Skiff, MD;  Location: [facility] ENDOSCOPY;  Service: Gastroenterology;  Laterality: N/A;   ESOPHAGOGASTRODUODENOSCOPY (EGD) WITH PROPOFOL  N/A 11/02/2019   Procedure: ESOPHAGOGASTRODUODENOSCOPY (EGD) WITH PROPOFOL ;  Surgeon: Unk Corinn Skiff, MD;  Location: [facility] ENDOSCOPY;  Service: Gastroenterology;  Laterality: N/A;   ESOPHAGOGASTRODUODENOSCOPY (EGD) WITH PROPOFOL  N/A 08/01/2021   Procedure: ESOPHAGOGASTRODUODENOSCOPY (EGD) WITH PROPOFOL ;  Surgeon: Unk Corinn Skiff, MD;  Location: [facility] ENDOSCOPY;  Service: Gastroenterology;  Laterality: N/A;  with Capsule placement   GIVENS CAPSULE STUDY N/A 08/01/2021   Procedure: GIVENS CAPSULE STUDY;  Surgeon: Unk Corinn Skiff, MD;  Location: [facility] ENDOSCOPY;  Service: Gastroenterology;  Laterality: N/A;   TUBAL LIGATION      Family History  Problem Relation Age of Onset   Coronary artery disease Mother    Heart disease Mother        Heart  attack, 5 to 7 stents over 20 years passed 2020   Hypertension Mother    Stroke Father    Heart attack Father        Stroke passed 2005   Drug abuse Brother    Heart attack Brother        Stroke passed 2016   Stroke Brother    Heart attack Brother        Stroke Afib last event 6/23 currently in skilled nursing    Social History:  reports that she quit smoking about 17 years ago. Her smoking use included  cigarettes. She started smoking about 57 years ago. She has a 12 pack-year smoking history. She has been exposed to tobacco smoke. She has never used smokeless tobacco. She reports that she does not drink alcohol and does not use drugs.  Allergies: No Known Allergies  Medications reviewed.    ROS Full ROS performed and is otherwise negative other than what is stated in HPI   BP 139/81   Pulse 73   Temp 98 F (36.7 C) (Oral)   Ht 5' 5 (1.651 m)   Wt 128 lb 12.8 oz (58.4 kg)   SpO2 95%   BMI 21.43 kg/m   Physical Exam  CONSTITUTIONAL: NAD. EYES: Pupils are equal, round, Sclera are non-icteric. EARS, NOSE, MOUTH AND THROAT: The oropharynx is clear. The oral mucosa is pink and moist. Hearing is intact to voice. LYMPH NODES:  Lymph nodes in the neck are normal. RESPIRATORY:  Lungs are clear. There is normal respiratory effort, with equal breath sounds bilaterally, and without pathologic use of accessory muscles. CARDIOVASCULAR: Heart is regular without murmurs, gallops, or rubs. GI: The abdomen is  soft, nontender, and nondistended. There are no palpable masses. There is no hepatosplenomegaly. There are normal bowel soundsGU: Rectal deferred.   MUSCULOSKELETAL: Normal muscle strength and tone. No cyanosis or edema.   SKIN: Turgor is good and there are no pathologic skin lesions or ulcers. NEUROLOGIC: Motor and sensation is grossly normal. Cranial nerves are grossly intact. PSYCH:  Oriented to person, place and time. Affect is normal.    Assessment/Plan 31 female with type III giant paraesophageal hernia and significant symptoms including reflux and dysphagia not responsive to medical therapy.. I  Discussed with the patient in detail about her disease process.  I do think that given the size of the hernia and sxs there is an indication for repair and she would be a good candidate for robotic approach.  We talked regarding her disease process and role for surgical intervention.  I did  talk to her specifically about the surgery and what this will entail the risks, the benefits and the possible complications including but not limited to: Bleeding, infection, injury to bowel or esophagus, bloating, dysphugia . She is ready to move forward with repai and she completed all appropriate preop w/u including cardiac optimization Plan Rob Paraesophageal H repair w partial fundoplicaiton We also talked about diet modifications following surgery.  She seems to understand this. I personally spent a total of 40 minutes in the care of the patient today including performing a medically appropriate exam/evaluation, counseling and educating, placing orders, referring and communicating with other health care professionals, documenting clinical information in the EHR, independently interpreting and reviewing images studies and coordinating care.     Laneta Luna, MD [facility] General Surgeon

## 2023-12-31 NOTE — H&P (View-Only) (Signed)
 Outpatient Surgical Follow Up    Meghan Moody is an 78 y.o. female.   Chief Complaint  Patient presents with   Follow-up    Hiatal hernia    HPI: 78 y.o. female seen in F/U for a paraesophageal hernia  History of significant reflux as well as some some dysphagia for certain meals including rice and [place].  She did complete a repeat EGD by Dr. Unk showing evidence of paraesophageal hernia.  Please note that I personally reviewed.  She did also completed a CT and Swallow that I have also personally reviewed showing giant hiatal hernia w majority of stomach within mediastinum.   She is able to perform more than 4 METS of activity without any shortness of or chest pain. Her only abdominal operation is tubal ligation.  She does have a history of chronic anemia with a last hemoglobin of 12.2, she does have a history of chronic kidney disease with a baseline creatinine of 1.25   SHe is now ready to move forward with the operation. She completed stress test w ef 59% and no reversible ischemia   Past Medical History:  Diagnosis Date   Anemia    Chronic bronchitis (HCC)    Chronic Cameron ulcer    Chronic cough    Deafness in left ear    GERD (gastroesophageal reflux disease)    H/O hemorrhoidectomy    Hiatal hernia    Hyperlipidemia    Hypertension    Iron  deficiency anemia due to chronic blood loss    SOB (shortness of breath) on exertion     Past Surgical History:  Procedure Laterality Date   BREAST CYST ASPIRATION Bilateral    neg   BREAST EXCISIONAL BIOPSY Right 1978   neg   BUNIONECTOMY     CATARACT EXTRACTION W/PHACO Right 03/30/2019   Procedure: CATARACT EXTRACTION PHACO AND INTRAOCULAR LENS PLACEMENT (IOC) RIGHT, 1.23, 00:20.8;  Surgeon: Myrna Adine Anes, MD;  Location: [facility];  Service: Ophthalmology;  Laterality: Right;   CATARACT EXTRACTION W/PHACO Left 04/27/2019   Procedure: CATARACT EXTRACTION PHACO AND INTRAOCULAR LENS PLACEMENT (IOC) LEFT 1.46   00:20.3;  Surgeon: Myrna Adine Anes, MD;  Location: [facility] SURGERY [facility]TR;  Service: Ophthalmology;  Laterality: Left;   COLONOSCOPY WITH PROPOFOL  N/A 11/02/2019   Procedure: COLONOSCOPY WITH PROPOFOL ;  Surgeon: Unk Corinn Skiff, MD;  Location: [facility] ENDOSCOPY;  Service: Gastroenterology;  Laterality: N/A;   COLONOSCOPY WITH PROPOFOL  N/A 08/01/2021   Procedure: COLONOSCOPY WITH PROPOFOL ;  Surgeon: Unk Corinn Skiff, MD;  Location: [facility] ENDOSCOPY;  Service: Gastroenterology;  Laterality: N/A;   ESOPHAGOGASTRODUODENOSCOPY N/A 12/04/2023   Procedure: EGD (ESOPHAGOGASTRODUODENOSCOPY);  Surgeon: Unk Corinn Skiff, MD;  Location: [facility] ENDOSCOPY;  Service: Gastroenterology;  Laterality: N/A;   ESOPHAGOGASTRODUODENOSCOPY (EGD) WITH PROPOFOL  N/A 11/02/2019   Procedure: ESOPHAGOGASTRODUODENOSCOPY (EGD) WITH PROPOFOL ;  Surgeon: Unk Corinn Skiff, MD;  Location: [facility] ENDOSCOPY;  Service: Gastroenterology;  Laterality: N/A;   ESOPHAGOGASTRODUODENOSCOPY (EGD) WITH PROPOFOL  N/A 08/01/2021   Procedure: ESOPHAGOGASTRODUODENOSCOPY (EGD) WITH PROPOFOL ;  Surgeon: Unk Corinn Skiff, MD;  Location: [facility] ENDOSCOPY;  Service: Gastroenterology;  Laterality: N/A;  with Capsule placement   GIVENS CAPSULE STUDY N/A 08/01/2021   Procedure: GIVENS CAPSULE STUDY;  Surgeon: Unk Corinn Skiff, MD;  Location: [facility] ENDOSCOPY;  Service: Gastroenterology;  Laterality: N/A;   TUBAL LIGATION      Family History  Problem Relation Age of Onset   Coronary artery disease Mother    Heart disease Mother        Heart  attack, 5 to 7 stents over 20 years passed 2020   Hypertension Mother    Stroke Father    Heart attack Father        Stroke passed 2005   Drug abuse Brother    Heart attack Brother        Stroke passed 2016   Stroke Brother    Heart attack Brother        Stroke Afib last event 6/23 currently in skilled nursing    Social History:  reports that she quit smoking about 17 years ago. Her smoking use included  cigarettes. She started smoking about 57 years ago. She has a 12 pack-year smoking history. She has been exposed to tobacco smoke. She has never used smokeless tobacco. She reports that she does not drink alcohol and does not use drugs.  Allergies: No Known Allergies  Medications reviewed.    ROS Full ROS performed and is otherwise negative other than what is stated in HPI   BP 139/81   Pulse 73   Temp 98 F (36.7 C) (Oral)   Ht 5' 5 (1.651 m)   Wt 128 lb 12.8 oz (58.4 kg)   SpO2 95%   BMI 21.43 kg/m   Physical Exam  CONSTITUTIONAL: NAD. EYES: Pupils are equal, round, Sclera are non-icteric. EARS, NOSE, MOUTH AND THROAT: The oropharynx is clear. The oral mucosa is pink and moist. Hearing is intact to voice. LYMPH NODES:  Lymph nodes in the neck are normal. RESPIRATORY:  Lungs are clear. There is normal respiratory effort, with equal breath sounds bilaterally, and without pathologic use of accessory muscles. CARDIOVASCULAR: Heart is regular without murmurs, gallops, or rubs. GI: The abdomen is  soft, nontender, and nondistended. There are no palpable masses. There is no hepatosplenomegaly. There are normal bowel soundsGU: Rectal deferred.   MUSCULOSKELETAL: Normal muscle strength and tone. No cyanosis or edema.   SKIN: Turgor is good and there are no pathologic skin lesions or ulcers. NEUROLOGIC: Motor and sensation is grossly normal. Cranial nerves are grossly intact. PSYCH:  Oriented to person, place and time. Affect is normal.    Assessment/Plan 31 female with type III giant paraesophageal hernia and significant symptoms including reflux and dysphagia not responsive to medical therapy.. I  Discussed with the patient in detail about her disease process.  I do think that given the size of the hernia and sxs there is an indication for repair and she would be a good candidate for robotic approach.  We talked regarding her disease process and role for surgical intervention.  I did  talk to her specifically about the surgery and what this will entail the risks, the benefits and the possible complications including but not limited to: Bleeding, infection, injury to bowel or esophagus, bloating, dysphugia . She is ready to move forward with repai and she completed all appropriate preop w/u including cardiac optimization Plan Rob Paraesophageal H repair w partial fundoplicaiton We also talked about diet modifications following surgery.  She seems to understand this. I personally spent a total of 40 minutes in the care of the patient today including performing a medically appropriate exam/evaluation, counseling and educating, placing orders, referring and communicating with other health care professionals, documenting clinical information in the EHR, independently interpreting and reviewing images studies and coordinating care.     Laneta Luna, MD [facility] General Surgeon

## 2023-12-31 NOTE — Progress Notes (Signed)
 Pharmacy Quality Measure Review  This patient is appearing on a report for being at risk of failing the adherence measure for hypertension (ACEi/ARB) medications this calendar year.   Medication: valsartan   Medication has been intentionally discontinued as of Aug 2025 for hypotension. Will continue to appear on adherence list, no further action at this time.

## 2024-01-07 ENCOUNTER — Encounter
Admission: RE | Admit: 2024-01-07 | Discharge: 2024-01-07 | Disposition: A | Discharge status: Home / Self Care | Source: Ambulatory Visit | Attending: Surgery | Admitting: Surgery

## 2024-01-07 ENCOUNTER — Other Ambulatory Visit: Payer: Self-pay

## 2024-01-07 HISTORY — DX: Unspecified osteoarthritis, unspecified site: M19.90

## 2024-01-07 NOTE — Patient Instructions (Addendum)
 Your procedure is scheduled on: Tuesday 01/14/24 Report to the Registration Desk on the 1st floor of the [facility]. To find out your arrival time, please call [phone] between 1PM - 3PM on: Monday 01/13/24 If your arrival time is 6:00 am, do not arrive before that time as the Medical Mall entrance doors do not open until 6:00 am.  REMEMBER: Instructions that are not followed completely may result in serious medical risk, up to and including death; or upon the discretion of your surgeon and anesthesiologist your surgery may need to be rescheduled.  Do not eat food after midnight the night before surgery.  No gum chewing or hard candies.  You may however, drink CLEAR liquids up to 2 hours before you are scheduled to arrive for your surgery. Do not drink anything within 2 hours of your scheduled arrival time.  Clear liquids include: - water  - apple juice without pulp - black coffee or tea (Do NOT add milk or creamers to the coffee or tea) Do NOT drink anything that is not on this list.   One week prior to surgery: Stop Anti-inflammatories (NSAIDS) such as Advil , Aleve, Ibuprofen , Motrin , Naproxen, Naprosyn and Aspirin  based products such as Excedrin, Goody's Powder, BC Powder. Stop ANY OVER THE COUNTER supplements until after surgery.  You may however, continue to take Tylenol  if needed for pain up until the day of surgery.  Stop aspirin  EC 81 MG and cilostazol  (PLETAL ) 50 MG 5 days prior to surgery as instructed by your cardiologist (take last dose 01/08/24)  Continue taking all of your other prescription medications up until the day of surgery.  ON THE DAY OF SURGERY ONLY TAKE THESE MEDICATIONS WITH SIPS OF WATER:  omeprazole  (PRILOSEC ) 40 MG  atorvastatin  (LIPITOR) 40 MG    No Alcohol for 24 hours before or after surgery.  No Smoking including e-cigarettes for 24 hours before surgery.  No chewable tobacco products for at least 6 hours before surgery.  No nicotine  patches on the day of surgery.  Do not use any recreational drugs for at least a week (preferably 2 weeks) before your surgery.  Please be advised that the combination of cocaine and anesthesia may have negative outcomes, up to and including death. If you test positive for cocaine, your surgery will be cancelled.  On the morning of surgery brush your teeth with toothpaste and water, you may rinse your mouth with mouthwash if you wish. Do not swallow any toothpaste or mouthwash.  Use CHG Soap or wipes as directed on instruction sheet.  Do not wear jewelry, make-up, hairpins, clips or nail polish.  For welded (permanent) jewelry: bracelets, anklets, waist bands, etc.  Please have this removed prior to surgery.  If it is not removed, there is a chance that hospital personnel will need to cut it off on the day of surgery.  Do not wear lotions, powders, or perfumes.   Do not shave body hair from the neck down 48 hours before surgery.  Contact lenses, hearing aids and dentures may not be worn into surgery.  Do not bring valuables to the hospital. [facility] is not responsible for any missing/lost belongings or valuables.   Notify your doctor if there is any change in your medical condition (cold, fever, infection).  Wear comfortable clothing (specific to your surgery type) to the hospital.  After surgery, you can help prevent lung complications by doing breathing exercises.  Take deep breaths and cough every 1-2 hours. Your doctor  may order a device called an Incentive Spirometer to help you take deep breaths. When coughing or sneezing, hold a pillow firmly against your incision with both hands. This is called "splinting." Doing this helps protect your incision. It also decreases belly discomfort.  If you are being admitted to the hospital overnight, leave your suitcase in the car. After surgery it may be brought to your room.  In case of increased patient census, it may be necessary  for you, the patient, to continue your postoperative care in the Same Day Surgery department.  If you are being discharged the day of surgery, you will not be allowed to drive home. You will need a responsible individual to drive you home and stay with you for 24 hours after surgery.   If you are taking public transportation, you will need to have a responsible individual with you.  Please call the Pre-admissions Testing Dept. at [phone] if you have any questions about these instructions.  Surgery Visitation Policy:  Patients having surgery or a procedure may have two visitors.  Children under the age of 6 must have an adult with them who is not the patient.  Inpatient Visitation:    Visiting hours are 7 a.m. to 8 p.m. Up to four visitors are allowed at one time in a patient room. The visitors may rotate out with other people during the day.  One visitor age 36 or older may stay with the patient overnight and must be in the room by 8 p.m.   Merchandiser, retail to address health-related social needs:  [email or web address]                                                                                                             Preparing for Surgery with CHLORHEXIDINE GLUCONATE (CHG) Soap  Chlorhexidine Gluconate (CHG) Soap  o An antiseptic cleaner that kills germs and bonds with the skin to continue killing germs even after washing  o Used for showering the night before surgery and morning of surgery  Before surgery, you can play an important role by reducing the number of germs on your skin.  CHG (Chlorhexidine gluconate) soap is an antiseptic cleanser which kills germs and bonds with the skin to continue killing germs even after washing.  Please do not use if you have an allergy to CHG or antibacterial soaps. If your skin becomes reddened/irritated stop using the CHG.  1. Shower the NIGHT BEFORE SURGERY and the MORNING OF SURGERY with CHG  soap.  2. If you choose to wash your hair, wash your hair first as usual with your normal shampoo.  3. After shampooing, rinse your hair and body thoroughly to remove the shampoo.  4. Use CHG as you would any other liquid soap. You can apply CHG directly to the skin and wash gently with a scrungie or a clean washcloth.  5. Apply the CHG soap to your body only from the neck down. Do not use on open wounds or open sores. Avoid contact with your eyes, ears,  mouth, and genitals (private parts). Wash face and genitals (private parts) with your normal soap.  6. Wash thoroughly, paying special attention to the area where your surgery will be performed.  7. Thoroughly rinse your body with warm water.  8. Do not shower/wash with your normal soap after using and rinsing off the CHG soap.  9. Pat yourself dry with a clean towel.  10. Wear clean pajamas to bed the night before surgery.  12. Place clean sheets on your bed the night of your first shower and do not sleep with pets.  13. Shower again with the CHG soap on the day of surgery prior to arriving at the hospital.  14. Do not apply any deodorants/lotions/powders.  15. Please wear clean clothes to the hospital.

## 2024-01-08 ENCOUNTER — Inpatient Hospital Stay: Attending: Oncology

## 2024-01-08 ENCOUNTER — Other Ambulatory Visit: Payer: Self-pay

## 2024-01-08 DIAGNOSIS — D509 Iron deficiency anemia, unspecified: Secondary | ICD-10-CM | POA: Insufficient documentation

## 2024-01-08 LAB — CBC WITH DIFFERENTIAL (CANCER CENTER ONLY)
Abs Immature Granulocytes: 0.03 K/uL (ref 0.00–0.07)
Basophils Absolute: 0.1 K/uL (ref 0.0–0.1)
Basophils Relative: 1 %
Eosinophils Absolute: 0.1 K/uL (ref 0.0–0.5)
Eosinophils Relative: 2 %
HCT: 43.5 % (ref 36.0–46.0)
Hemoglobin: 13.9 g/dL (ref 12.0–15.0)
Immature Granulocytes: 0 %
Lymphocytes Relative: 18 %
Lymphs Abs: 1.3 K/uL (ref 0.7–4.0)
MCH: 28.3 pg (ref 26.0–34.0)
MCHC: 32 g/dL (ref 30.0–36.0)
MCV: 88.6 fL (ref 80.0–100.0)
Monocytes Absolute: 0.6 K/uL (ref 0.1–1.0)
Monocytes Relative: 8 %
Neutro Abs: 5 K/uL (ref 1.7–7.7)
Neutrophils Relative %: 71 %
Platelet Count: 219 K/uL (ref 150–400)
RBC: 4.91 MIL/uL (ref 3.87–5.11)
RDW: 20 % — ABNORMAL HIGH (ref 11.5–15.5)
WBC Count: 7.1 K/uL (ref 4.0–10.5)
nRBC: 0 % (ref 0.0–0.2)

## 2024-01-08 LAB — FERRITIN: Ferritin: 26 ng/mL (ref 11–307)

## 2024-01-08 LAB — IRON AND TIBC
Iron: 61 ug/dL (ref 28–170)
Saturation Ratios: 16 % (ref 10.4–31.8)
TIBC: 393 ug/dL (ref 250–450)
UIBC: 332 ug/dL

## 2024-01-09 ENCOUNTER — Inpatient Hospital Stay (HOSPITAL_BASED_OUTPATIENT_CLINIC_OR_DEPARTMENT_OTHER): Admitting: Oncology

## 2024-01-09 ENCOUNTER — Inpatient Hospital Stay

## 2024-01-09 ENCOUNTER — Encounter: Payer: Self-pay | Admitting: Oncology

## 2024-01-09 VITALS — BP 135/83 | HR 68 | Temp 97.6°F | Resp 17 | Wt 129.7 lb

## 2024-01-09 DIAGNOSIS — D509 Iron deficiency anemia, unspecified: Secondary | ICD-10-CM

## 2024-01-09 NOTE — Progress Notes (Unsigned)
 [facility]  Telephone:(336) [phone] Fax:(336) [phone]  ID: Meghan Moody OB: December 07, 1945  MR#: [id number]  RDW#:[id number]  Patient Care Team: Marylynn Verneita CROME, MD as PCP - General (Internal Medicine) Darron Deatrice LABOR, MD as PCP - Cardiology (Cardiology) Jimmy Charlie FERNS, MD as Referring Physician (Pediatrics) Jacobo Evalene PARAS, MD as Consulting Physician (Oncology) Sheldon Standing, MD as Consulting Physician (General Surgery) Unk Corinn Skiff, MD as Consulting Physician (Gastroenterology) Darron Deatrice LABOR, MD as Consulting Physician (Cardiology)  CHIEF COMPLAINT: Iron  deficiency anemia.  INTERVAL HISTORY: Patient returns to clinic today for repeat laboratory, further evaluation, consideration of additional IV Venofer .  She currently feels well and is asymptomatic.  She does not complain of any weakness or fatigue today. She has no neurologic complaints.  She denies any recent fevers or illnesses.  She has a good appetite and denies weight loss.  She denies any chest pain, shortness of breath, cough, or hemoptysis.  She denies any nausea, vomiting, constipation, or diarrhea.  She has no melena or hematochezia.  She has no urinary complaints.  Patient offers no specific complaints today.  REVIEW OF SYSTEMS:   Review of Systems  Constitutional: Negative.  Negative for fever, malaise/fatigue and weight loss.  Respiratory: Negative.  Negative for cough, hemoptysis and shortness of breath.   Cardiovascular: Negative.  Negative for chest pain and leg swelling.  Gastrointestinal: Negative.  Negative for abdominal pain, blood in stool and melena.  Genitourinary: Negative.  Negative for hematuria.  Musculoskeletal: Negative.  Negative for back pain.  Skin: Negative.  Negative for rash.  Neurological: Negative.  Negative for dizziness, focal weakness, weakness and headaches.  Psychiatric/Behavioral: Negative.  The patient is not nervous/anxious.     As per HPI.  Otherwise, a complete review of systems is negative.  PAST MEDICAL HISTORY: Past Medical History:  Diagnosis Date   Anemia    Arthritis    Chronic bronchitis (HCC)    Chronic Cameron ulcer    Chronic cough    Deafness in left ear    GERD (gastroesophageal reflux disease)    H/O hemorrhoidectomy    Hiatal hernia    Hyperlipidemia    Hypertension    Iron  deficiency anemia due to chronic blood loss    SOB (shortness of breath) on exertion     PAST SURGICAL HISTORY: Past Surgical History:  Procedure Laterality Date   BREAST CYST ASPIRATION Bilateral    neg   BREAST EXCISIONAL BIOPSY Right 1978   neg   BUNIONECTOMY     CATARACT EXTRACTION W/PHACO Right 03/30/2019   Procedure: CATARACT EXTRACTION PHACO AND INTRAOCULAR LENS PLACEMENT (IOC) RIGHT, 1.23, 00:20.8;  Surgeon: Myrna Adine Anes, MD;  Location: [facility] SURGERY CNTR;  Service: Ophthalmology;  Laterality: Right;   CATARACT EXTRACTION W/PHACO Left 04/27/2019   Procedure: CATARACT EXTRACTION PHACO AND INTRAOCULAR LENS PLACEMENT (IOC) LEFT 1.46  00:20.3;  Surgeon: Myrna Adine Anes, MD;  Location: [facility] SURGERY [facility]TR;  Service: Ophthalmology;  Laterality: Left;   COLONOSCOPY WITH PROPOFOL  N/A 11/02/2019   Procedure: COLONOSCOPY WITH PROPOFOL ;  Surgeon: Unk Corinn Skiff, MD;  Location: [facility] ENDOSCOPY;  Service: Gastroenterology;  Laterality: N/A;   COLONOSCOPY WITH PROPOFOL  N/A 08/01/2021   Procedure: COLONOSCOPY WITH PROPOFOL ;  Surgeon: Unk Corinn Skiff, MD;  Location: [facility] ENDOSCOPY;  Service: Gastroenterology;  Laterality: N/A;   ESOPHAGOGASTRODUODENOSCOPY N/A 12/04/2023   Procedure: EGD (ESOPHAGOGASTRODUODENOSCOPY);  Surgeon: Unk Corinn Skiff, MD;  Location: [facility] ENDOSCOPY;  Service: Gastroenterology;  Laterality: N/A;   ESOPHAGOGASTRODUODENOSCOPY (EGD) WITH PROPOFOL  N/A  11/02/2019   Procedure: ESOPHAGOGASTRODUODENOSCOPY (EGD) WITH PROPOFOL ;  Surgeon: Unk Corinn Skiff, MD;  Location: [facility] ENDOSCOPY;  Service:  Gastroenterology;  Laterality: N/A;   ESOPHAGOGASTRODUODENOSCOPY (EGD) WITH PROPOFOL  N/A 08/01/2021   Procedure: ESOPHAGOGASTRODUODENOSCOPY (EGD) WITH PROPOFOL ;  Surgeon: Unk Corinn Skiff, MD;  Location: [facility] ENDOSCOPY;  Service: Gastroenterology;  Laterality: N/A;  with Capsule placement   GIVENS CAPSULE STUDY N/A 08/01/2021   Procedure: GIVENS CAPSULE STUDY;  Surgeon: Unk Corinn Skiff, MD;  Location: [facility] ENDOSCOPY;  Service: Gastroenterology;  Laterality: N/A;   TUBAL LIGATION      FAMILY HISTORY: Family History  Problem Relation Age of Onset   Coronary artery disease Mother    Heart disease Mother        Heart attack, 5 to 7 stents over 20 years passed 2020   Hypertension Mother    Stroke Father    Heart attack Father        Stroke passed 2005   Drug abuse Brother    Heart attack Brother        Stroke passed 2016   Stroke Brother    Heart attack Brother        Stroke Afib last event 6/23 currently in skilled nursing    ADVANCED DIRECTIVES (Y/N):  N  HEALTH MAINTENANCE: Social History   Tobacco Use   Smoking status: Former    Current packs/day: 0.00    Average packs/day: 0.3 packs/day for 40.0 years (12.0 ttl pk-yrs)    Types: Cigarettes    Start date: 04/16/1966    Quit date: 04/16/2006    Years since quitting: 17.7    Passive exposure: Current   Smokeless tobacco: Never  Vaping Use   Vaping status: Never Used  Substance Use Topics   Alcohol use: No   Drug use: No     Colonoscopy:  PAP:  Bone density:  Lipid panel:  No Known Allergies  Current Outpatient Medications  Medication Sig Dispense Refill   acetaminophen  (TYLENOL ) 500 MG tablet Take 500 mg by mouth every 8 (eight) hours as needed for moderate pain (pain score 4-6).     aspirin  EC 81 MG tablet Take 1 tablet (81 mg total) by mouth daily. Swallow whole.     atorvastatin  (LIPITOR) 40 MG tablet Take 1 tablet (40 mg total) by mouth daily. 90 tablet 3   cilostazol  (PLETAL ) 50 MG tablet Take 1 tablet  (50 mg total) by mouth 2 (two) times daily. 180 tablet 3   omeprazole  (PRILOSEC ) 40 MG capsule Take 40 mg by mouth daily.     triamterene -hydrochlorothiazide (MAXZIDE-25) 37.5-25 MG tablet Take 0.5 tablets by mouth daily. 45 tablet 3   No current facility-administered medications for this visit.    OBJECTIVE: Vitals:   01/09/24 1418  BP: 135/83  Pulse: 68  Resp: 17  Temp: 97.6 F (36.4 C)  SpO2: 94%      Body mass index is 21.58 kg/m.    ECOG FS:0 - Asymptomatic  General: Well-developed, well-nourished, no acute distress. Eyes: Pink conjunctiva, anicteric sclera. HEENT: Normocephalic, moist mucous membranes. Lungs: No audible wheezing or coughing. Heart: Regular rate and rhythm. Abdomen: Soft, nontender, no obvious distention. Musculoskeletal: No edema, cyanosis, or clubbing. Neuro: Alert, answering all questions appropriately. Cranial nerves grossly intact. Skin: No rashes or petechiae noted. Psych: Normal affect.  LAB RESULTS:  Lab Results  Component Value Date   NA 141 12/10/2023   K 3.7 12/10/2023   CL 104 12/10/2023   CO2 27 12/10/2023   GLUCOSE  85 12/10/2023   BUN 25 (H) 12/10/2023   CREATININE 1.06 12/10/2023   CALCIUM  9.7 12/10/2023   PROT 6.8 08/29/2023   ALBUMIN 4.4 08/29/2023   ALBUMIN 4.4 08/29/2023   AST 24 08/29/2023   ALT 20 08/29/2023   ALKPHOS 97 08/29/2023   BILITOT 0.7 08/29/2023   GFRNONAA 55 (L) 11/02/2022   GFRAA 49 (L) 08/29/2015    Lab Results  Component Value Date   WBC 7.1 01/08/2024   NEUTROABS 5.0 01/08/2024   HGB 13.9 01/08/2024   HCT 43.5 01/08/2024   MCV 88.6 01/08/2024   PLT 219 01/08/2024   Lab Results  Component Value Date   IRON  61 01/08/2024   TIBC 393 01/08/2024   IRONPCTSAT 16 01/08/2024   Lab Results  Component Value Date   FERRITIN 26 01/08/2024     STUDIES: NM Myocar Multi W/Spect W/Wall Motion / EF Result Date: 12/20/2023 Pharmacological myocardial perfusion imaging study with no significant   ischemia Normal wall motion, EF estimated at 59% No EKG changes concerning for ischemia at peak stress or in recovery. CT attenuation correction images with mild coronary calcification and aortic atherosclerosis Low risk scan Signed, Velinda Lunger, MD, Ph.D [facility]    ASSESSMENT: Iron  deficiency anemia.  PLAN:    Iron  deficiency anemia: Resolved.  Patient's hemoglobin and iron  stores are now within normal limits.  Her most recent luminal evaluation on August 01, 2021 with no evidence of active bleeding and no significant pathology reported.  She does not require additional IV Venofer  today.  Patient last received treatment on December 11, 2023.  No intervention is needed.  Return to clinic in 4 months with repeat laboratory work, further evaluation, and continuation of treatment if needed.   Hiatal hernia: Patient has surgical repair scheduled in the next several weeks.  I spent a total of 20 minutes reviewing chart data, face-to-face evaluation with the patient, counseling and coordination of care as detailed above.    Patient expressed understanding and was in agreement with this plan. She also understands that She can call clinic at any time with any questions, concerns, or complaints.     Evalene JINNY Reusing, MD   01/10/2024 9:53 AM

## 2024-01-10 ENCOUNTER — Encounter: Payer: Self-pay | Admitting: Surgery

## 2024-01-10 ENCOUNTER — Encounter: Payer: Self-pay | Admitting: Oncology

## 2024-01-14 ENCOUNTER — Encounter: Payer: Self-pay | Admitting: Surgery

## 2024-01-14 ENCOUNTER — Observation Stay

## 2024-01-14 ENCOUNTER — Ambulatory Visit: Payer: Self-pay | Admitting: Urgent Care

## 2024-01-14 ENCOUNTER — Other Ambulatory Visit: Payer: Self-pay

## 2024-01-14 ENCOUNTER — Observation Stay
Admission: RE | Admit: 2024-01-14 | Discharge: 2024-01-15 | Disposition: A | Discharge status: Home / Self Care | Attending: Surgery | Admitting: Surgery

## 2024-01-14 ENCOUNTER — Encounter
Admission: RE | Disposition: A | Discharge status: Home / Self Care | Payer: Self-pay | Source: Home / Self Care | Attending: Surgery

## 2024-01-14 DIAGNOSIS — K449 Diaphragmatic hernia without obstruction or gangrene: Principal | ICD-10-CM | POA: Insufficient documentation

## 2024-01-14 DIAGNOSIS — Z87891 Personal history of nicotine dependence: Secondary | ICD-10-CM | POA: Diagnosis not present

## 2024-01-14 DIAGNOSIS — I129 Hypertensive chronic kidney disease with stage 1 through stage 4 chronic kidney disease, or unspecified chronic kidney disease: Secondary | ICD-10-CM | POA: Insufficient documentation

## 2024-01-14 DIAGNOSIS — K219 Gastro-esophageal reflux disease without esophagitis: Secondary | ICD-10-CM | POA: Insufficient documentation

## 2024-01-14 DIAGNOSIS — M7989 Other specified soft tissue disorders: Secondary | ICD-10-CM | POA: Diagnosis not present

## 2024-01-14 DIAGNOSIS — Z79899 Other long term (current) drug therapy: Secondary | ICD-10-CM | POA: Insufficient documentation

## 2024-01-14 DIAGNOSIS — Z7982 Long term (current) use of aspirin: Secondary | ICD-10-CM | POA: Diagnosis not present

## 2024-01-14 DIAGNOSIS — Z8719 Personal history of other diseases of the digestive system: Principal | ICD-10-CM

## 2024-01-14 DIAGNOSIS — N183 Chronic kidney disease, stage 3 unspecified: Secondary | ICD-10-CM | POA: Insufficient documentation

## 2024-01-14 HISTORY — DX: Long term (current) use of antithrombotics/antiplatelets: Z79.02

## 2024-01-14 HISTORY — DX: Atherosclerosis of aorta: I70.0

## 2024-01-14 HISTORY — DX: Personal history of nicotine dependence: Z87.891

## 2024-01-14 HISTORY — DX: Long term (current) use of aspirin: Z79.82

## 2024-01-14 HISTORY — PX: XI ROBOTIC ASSISTED PARAESOPHAGEAL HERNIA REPAIR: SHX6871

## 2024-01-14 HISTORY — DX: Peripheral vascular disease, unspecified: I73.9

## 2024-01-14 HISTORY — PX: INSERTION OF MESH: SHX5868

## 2024-01-14 SURGERY — REPAIR, HERNIA, PARAESOPHAGEAL, ROBOT-ASSISTED
Anesthesia: General

## 2024-01-14 MED ORDER — PHENYLEPHRINE HCL-NACL 20-0.9 MG/250ML-% IV SOLN
INTRAVENOUS | Status: DC | PRN
  Administered 2024-01-14: 25 ug/min via INTRAVENOUS

## 2024-01-14 MED ORDER — HYDROMORPHONE HCL 1 MG/ML IJ SOLN
INTRAMUSCULAR | Status: DC | PRN
  Administered 2024-01-14 (×2): .5 mg via INTRAVENOUS

## 2024-01-14 MED ORDER — LACTATED RINGERS IV SOLN: INTRAVENOUS | Status: DC

## 2024-01-14 MED ORDER — TRIAMTERENE-HCTZ 37.5-25 MG PO TABS
0.5000 | ORAL_TABLET | Freq: Every day | ORAL | Status: DC
  Administered 2024-01-14 – 2024-01-15 (×2): 0.5 via ORAL
  Filled 2024-01-14 (×2): qty 0.5

## 2024-01-14 MED ORDER — ROCURONIUM BROMIDE 100 MG/10ML IV SOLN
INTRAVENOUS | Status: DC | PRN
  Administered 2024-01-14: 50 mg via INTRAVENOUS
  Administered 2024-01-14: 10 mg via INTRAVENOUS

## 2024-01-14 MED ORDER — CHLORHEXIDINE GLUCONATE 0.12 % MT SOLN
15.0000 mL | Freq: Once | OROMUCOSAL | Status: AC
  Administered 2024-01-14: 15 mL via OROMUCOSAL

## 2024-01-14 MED ORDER — ACETAMINOPHEN 10 MG/ML IV SOLN
INTRAVENOUS | Status: DC | PRN
  Administered 2024-01-14: 1000 mg via INTRAVENOUS

## 2024-01-14 MED ORDER — BUPIVACAINE-EPINEPHRINE (PF) 0.25% -1:200000 IJ SOLN
INTRAMUSCULAR | Status: AC
  Filled 2024-01-14: qty 30

## 2024-01-14 MED ORDER — OXYCODONE HCL 5 MG PO TABS
5.0000 mg | ORAL_TABLET | ORAL | Status: DC | PRN
  Administered 2024-01-14: 10 mg via ORAL
  Administered 2024-01-14: 5 mg via ORAL
  Filled 2024-01-14: qty 2
  Filled 2024-01-14: qty 1

## 2024-01-14 MED ORDER — SEVOFLURANE IN SOLN
RESPIRATORY_TRACT | Status: AC
  Filled 2024-01-14: qty 250

## 2024-01-14 MED ORDER — HYDRALAZINE HCL 20 MG/ML IJ SOLN: 10.0000 mg | INTRAMUSCULAR | Status: DC | PRN

## 2024-01-14 MED ORDER — MIDAZOLAM HCL 2 MG/2ML IJ SOLN
INTRAMUSCULAR | Status: AC
  Filled 2024-01-14: qty 2

## 2024-01-14 MED ORDER — DROPERIDOL 2.5 MG/ML IJ SOLN: 0.6250 mg | Freq: Once | INTRAMUSCULAR | Status: DC | PRN

## 2024-01-14 MED ORDER — GLYCOPYRROLATE 0.2 MG/ML IJ SOLN
INTRAMUSCULAR | Status: DC | PRN
Start: 2024-01-14 — End: 2024-01-14
  Administered 2024-01-14: .2 mg via INTRAVENOUS

## 2024-01-14 MED ORDER — SODIUM CHLORIDE 0.9 % IV SOLN: INTRAVENOUS | Status: DC

## 2024-01-14 MED ORDER — FENTANYL CITRATE (PF) 100 MCG/2ML IJ SOLN
INTRAMUSCULAR | Status: AC
  Filled 2024-01-14: qty 2

## 2024-01-14 MED ORDER — ONDANSETRON HCL 4 MG/2ML IJ SOLN: 4.0000 mg | Freq: Four times a day (QID) | INTRAMUSCULAR | Status: DC | PRN

## 2024-01-14 MED ORDER — LIDOCAINE HCL (CARDIAC) PF 100 MG/5ML IV SOSY
PREFILLED_SYRINGE | INTRAVENOUS | Status: DC | PRN
  Administered 2024-01-14: 100 mg via INTRAVENOUS

## 2024-01-14 MED ORDER — ONDANSETRON 4 MG PO TBDP: 4.0000 mg | ORAL_TABLET | Freq: Four times a day (QID) | ORAL | Status: DC | PRN

## 2024-01-14 MED ORDER — ORAL CARE MOUTH RINSE: 15.0000 mL | Freq: Once | OROMUCOSAL | Status: AC

## 2024-01-14 MED ORDER — FENTANYL CITRATE (PF) 100 MCG/2ML IJ SOLN
INTRAMUSCULAR | Status: DC | PRN
  Administered 2024-01-14: 50 ug via INTRAVENOUS

## 2024-01-14 MED ORDER — EPHEDRINE SULFATE-NACL 50-0.9 MG/10ML-% IV SOSY
PREFILLED_SYRINGE | INTRAVENOUS | Status: DC | PRN
  Administered 2024-01-14 (×2): 5 mg via INTRAVENOUS

## 2024-01-14 MED ORDER — CEFAZOLIN SODIUM-DEXTROSE 2-4 GM/100ML-% IV SOLN
2.0000 g | Freq: Three times a day (TID) | INTRAVENOUS | Status: AC
  Administered 2024-01-14 – 2024-01-15 (×3): 2 g via INTRAVENOUS
  Filled 2024-01-14 (×3): qty 100

## 2024-01-14 MED ORDER — BUPIVACAINE-EPINEPHRINE 0.25% -1:200000 IJ SOLN
INTRAMUSCULAR | Status: DC | PRN
  Administered 2024-01-14: 30 mL

## 2024-01-14 MED ORDER — CHLORHEXIDINE GLUCONATE 0.12 % MT SOLN
OROMUCOSAL | Status: AC
  Filled 2024-01-14: qty 15

## 2024-01-14 MED ORDER — INDOCYANINE GREEN 25 MG IV SOLR
INTRAVENOUS | Status: DC | PRN
  Administered 2024-01-14: 25 mg via INTRAVENOUS

## 2024-01-14 MED ORDER — ENOXAPARIN SODIUM 40 MG/0.4ML IJ SOSY
40.0000 mg | PREFILLED_SYRINGE | INTRAMUSCULAR | Status: DC
  Administered 2024-01-15: 40 mg via SUBCUTANEOUS
  Filled 2024-01-14: qty 0.4

## 2024-01-14 MED ORDER — ACETAMINOPHEN 500 MG PO TABS
1000.0000 mg | ORAL_TABLET | Freq: Four times a day (QID) | ORAL | Status: DC
  Administered 2024-01-14 – 2024-01-15 (×3): 1000 mg via ORAL
  Filled 2024-01-14 (×4): qty 2

## 2024-01-14 MED ORDER — HYDROMORPHONE HCL 1 MG/ML IJ SOLN
INTRAMUSCULAR | Status: AC
  Filled 2024-01-14: qty 1

## 2024-01-14 MED ORDER — IOHEXOL 300 MG/ML  SOLN
150.0000 mL | Freq: Once | INTRAMUSCULAR | Status: AC | PRN
  Administered 2024-01-14: 50 mL via ORAL

## 2024-01-14 MED ORDER — DIPHENHYDRAMINE HCL 50 MG/ML IJ SOLN: 12.5000 mg | Freq: Four times a day (QID) | INTRAMUSCULAR | Status: DC | PRN

## 2024-01-14 MED ORDER — PROPOFOL 1000 MG/100ML IV EMUL
INTRAVENOUS | Status: AC
Start: 2024-01-14 — End: 2024-01-14
  Filled 2024-01-14: qty 100

## 2024-01-14 MED ORDER — PROCHLORPERAZINE EDISYLATE 10 MG/2ML IJ SOLN: 5.0000 mg | Freq: Four times a day (QID) | INTRAMUSCULAR | Status: DC | PRN

## 2024-01-14 MED ORDER — DIPHENHYDRAMINE HCL 12.5 MG/5ML PO ELIX: 12.5000 mg | ORAL_SOLUTION | Freq: Four times a day (QID) | ORAL | Status: DC | PRN

## 2024-01-14 MED ORDER — CHLORHEXIDINE GLUCONATE CLOTH 2 % EX PADS
6.0000 | MEDICATED_PAD | Freq: Once | CUTANEOUS | Status: AC
  Administered 2024-01-14: 6 via TOPICAL

## 2024-01-14 MED ORDER — MIDAZOLAM HCL 2 MG/2ML IJ SOLN
INTRAMUSCULAR | Status: DC | PRN
  Administered 2024-01-14: 2 mg via INTRAVENOUS

## 2024-01-14 MED ORDER — MELATONIN 5 MG PO TABS: 2.5000 mg | ORAL_TABLET | Freq: Every evening | ORAL | Status: DC | PRN

## 2024-01-14 MED ORDER — MORPHINE SULFATE (PF) 2 MG/ML IV SOLN: 2.0000 mg | INTRAVENOUS | Status: DC | PRN

## 2024-01-14 MED ORDER — PROPOFOL 10 MG/ML IV BOLUS
INTRAVENOUS | Status: DC | PRN
  Administered 2024-01-14: 100 mg via INTRAVENOUS
  Administered 2024-01-14: 20 mg via INTRAVENOUS

## 2024-01-14 MED ORDER — VISTASEAL 10 ML SINGLE DOSE KIT
PACK | CUTANEOUS | Status: AC
Start: 2024-01-14 — End: 2024-01-14
  Filled 2024-01-14: qty 10

## 2024-01-14 MED ORDER — DEXAMETHASONE SODIUM PHOSPHATE 10 MG/ML IJ SOLN
INTRAMUSCULAR | Status: DC | PRN
  Administered 2024-01-14: 10 mg via INTRAVENOUS

## 2024-01-14 MED ORDER — CEFAZOLIN SODIUM-DEXTROSE 2-4 GM/100ML-% IV SOLN
INTRAVENOUS | Status: AC
  Filled 2024-01-14: qty 100

## 2024-01-14 MED ORDER — ONDANSETRON HCL 4 MG/2ML IJ SOLN
INTRAMUSCULAR | Status: DC | PRN
  Administered 2024-01-14 (×2): 4 mg via INTRAVENOUS

## 2024-01-14 MED ORDER — ACETAMINOPHEN 10 MG/ML IV SOLN
INTRAVENOUS | Status: AC
  Filled 2024-01-14: qty 100

## 2024-01-14 MED ORDER — DEXMEDETOMIDINE HCL IN NACL 200 MCG/50ML IV SOLN
INTRAVENOUS | Status: DC | PRN
  Administered 2024-01-14: 8 ug via INTRAVENOUS
  Administered 2024-01-14: 12 ug via INTRAVENOUS

## 2024-01-14 MED ORDER — FENTANYL CITRATE (PF) 100 MCG/2ML IJ SOLN
25.0000 ug | INTRAMUSCULAR | Status: DC | PRN
  Administered 2024-01-14: 25 ug via INTRAVENOUS

## 2024-01-14 MED ORDER — PHENYLEPHRINE 80 MCG/ML (10ML) SYRINGE FOR IV PUSH (FOR BLOOD PRESSURE SUPPORT)
PREFILLED_SYRINGE | INTRAVENOUS | Status: DC | PRN
  Administered 2024-01-14 (×3): 160 ug via INTRAVENOUS

## 2024-01-14 MED ORDER — PROCHLORPERAZINE MALEATE 10 MG PO TABS: 10.0000 mg | ORAL_TABLET | Freq: Four times a day (QID) | ORAL | Status: DC | PRN

## 2024-01-14 MED ORDER — CEFAZOLIN SODIUM-DEXTROSE 2-4 GM/100ML-% IV SOLN
2.0000 g | Freq: Once | INTRAVENOUS | Status: AC
  Administered 2024-01-14: 2 g via INTRAVENOUS

## 2024-01-14 MED ORDER — SUGAMMADEX SODIUM 200 MG/2ML IV SOLN
INTRAVENOUS | Status: DC | PRN
  Administered 2024-01-14: 200 mg via INTRAVENOUS

## 2024-01-14 SURGICAL SUPPLY — 44 items
APPLICATOR VISTASEAL 35 (MISCELLANEOUS) IMPLANT
CANNULA REDUCER 12-8 DVNC XI (CANNULA) ×3 IMPLANT
CLIP LIGATING HEM O LOK PURPLE (MISCELLANEOUS) IMPLANT
DEFOGGER SCOPE WARM SEASHARP (MISCELLANEOUS) ×3 IMPLANT
DERMABOND ADVANCED .7 DNX12 (GAUZE/BANDAGES/DRESSINGS) ×3 IMPLANT
DRAPE ARM DVNC X/XI (DISPOSABLE) ×12 IMPLANT
DRAPE COLUMN DVNC XI (DISPOSABLE) ×3 IMPLANT
ELECTRODE REM PT RTRN 9FT ADLT (ELECTROSURGICAL) ×3 IMPLANT
FORCEPS BPLR R/ABLATION 8 DVNC (INSTRUMENTS) ×3 IMPLANT
GLOVE BIO SURGEON STRL SZ7 (GLOVE) ×9 IMPLANT
GOWN STRL REUS W/ TWL LRG LVL3 (GOWN DISPOSABLE) ×12 IMPLANT
GRASPER LAPSCPC 5X45 DSP (INSTRUMENTS) ×3 IMPLANT
GRASPER TIP-UP FEN DVNC XI (INSTRUMENTS) ×3 IMPLANT
IRRIGATION STRYKERFLOW (MISCELLANEOUS) IMPLANT
IV 0.9% NACL 1000 ML (IV SOLUTION) IMPLANT
KIT IMAGING PINPOINTPAQ (MISCELLANEOUS) ×3 IMPLANT
KIT PINK PAD W/HEAD ARM REST (MISCELLANEOUS) ×3 IMPLANT
LABEL OR SOLS (LABEL) ×3 IMPLANT
MANIFOLD NEPTUNE II (INSTRUMENTS) ×3 IMPLANT
MESH BIO-A 7X10 SYN MAT (Mesh General) IMPLANT
NDL DRIVE SUT CUT DVNC (INSTRUMENTS) ×3 IMPLANT
NDL HYPO 22X1.5 SAFETY MO (MISCELLANEOUS) ×3 IMPLANT
NEEDLE DRIVE SUT CUT DVNC (INSTRUMENTS) ×2 IMPLANT
NEEDLE HYPO 22X1.5 SAFETY MO (MISCELLANEOUS) ×2 IMPLANT
OBTURATOR OPTICALSTD 8 DVNC (TROCAR) ×3 IMPLANT
PACK LAP CHOLECYSTECTOMY (MISCELLANEOUS) ×3 IMPLANT
SEAL UNIV 5-12 XI (MISCELLANEOUS) ×12 IMPLANT
SEALER VESSEL EXT DVNC XI (MISCELLANEOUS) ×3 IMPLANT
SOLUTION ELECTROSURG ANTI STCK (MISCELLANEOUS) ×3 IMPLANT
SPIKE FLUID TRANSFER (MISCELLANEOUS) ×3 IMPLANT
SPONGE T-LAP 18X18 ~~LOC~~+RFID (SPONGE) ×3 IMPLANT
SUT SILK 2 0 SH (SUTURE) ×6 IMPLANT
SUT STRATA 2-0 23CM CT-2 (SUTURE) ×3 IMPLANT
SUT VIC AB 3-0 SH 27X BRD (SUTURE) IMPLANT
SUT VICRYL 0 UR6 27IN ABS (SUTURE) ×6 IMPLANT
SUTURE MNCRL 4-0 27XMF (SUTURE) ×3 IMPLANT
SYR 30ML LL (SYRINGE) ×3 IMPLANT
SYRINGE TOOMEY IRRIG 70ML (MISCELLANEOUS) ×3 IMPLANT
SYSTEM BAG RETRIEVAL 10MM (BASKET) IMPLANT
TRAP FLUID SMOKE EVACUATOR (MISCELLANEOUS) ×3 IMPLANT
TRAY FOLEY SLVR 16FR LF STAT (SET/KITS/TRAYS/PACK) ×3 IMPLANT
TROCAR Z-THREAD FIOS 5X100MM (TROCAR) ×3 IMPLANT
TUBING EVAC SMOKE HEATED PNEUM (TUBING) ×3 IMPLANT
WATER STERILE IRR 500ML POUR (IV SOLUTION) ×3 IMPLANT

## 2024-01-14 NOTE — Op Note (Signed)
 Robotic assisted laparoscopic repair of  paraesophageal  hernia with Bio-A Mesh and parital fundoplication   Pre-operative Diagnosis: Giant Symptomatic paraesophageal hernia   Post-operative Diagnosis: same   Procedure:  Robotic assisted laparoscopic repair of  paraesophageal  hernia with Bio-A Mesh and Partial  fundoplication   Surgeon: Laneta Luna, MD FACS   Assistant: Shelba Rakers, RNFA . Required due to the complexity of the case the need for exposure and lack of first assist.   Anesthesia: Gen. with endotracheal tube   Findings: Giant Type III paraesophageal hernia w 2/3 of the stomach within mediastinum Loose wrap 320 degree over 50 FR Bougie No evidence of gastric or esophageal injuries Debilitated tissues including crus Need for extensive mediastinal dissection in order to restore the anatomy and have good esophageal length   Estimated Blood Loss: 50cc       Specimens: sac           Complications: none     Procedure Details  The patient was seen again in the Holding Room. The benefits, complications, treatment options, and expected outcomes were discussed with the patient. The risks of bleeding, infection, recurrence of symptoms, failure to resolve symptoms,  esophageal damage, Dysphagia, bowel injury, any of which could require further surgery were reviewed with the patient. The likelihood of improving the patient's symptoms with return to their baseline status is good.  The patient and/or family concurred with the proposed plan, giving informed consent.  The patient was taken to Operating Room, identified  and the procedure verified.  A Time Out was held and the above information confirmed.   Prior to the induction of general anesthesia, antibiotic prophylaxis was administered. VTE prophylaxis was in place. General endotracheal anesthesia was then administered and tolerated well. After the induction, the abdomen was prepped with Chloraprep and draped in the sterile  fashion. The patient was positioned in the supine position.   Cut down technique was used to enter the abdominal cavity and a Hasson trochar was placed after two vicryl stitches were anchored to the fascia. Pneumoperitoneum was then created with CO2 and tolerated well without any adverse changes in the patient's vital signs.  Three 8-mm ports were placed under direct vision. All skin incisions  were infiltrated with a local anesthetic agent before making the incision and placing the trocars. An additional 5 mm regular laparoscopic port was placed to assist with retraction and exposure.    The patient was positioned  in reverse Trendelenburg, robot was brought to the surgical field and docked in the standard fashion.  We made sure all the instrumentation was kept indirect view at all times and that there were no collision between the arms. I scrubbed out and went to the console.   I used a robotic arm to retract the liver, the vessel sealer on my right hand and a forced bipolar grasper on my left hand.  There is along the extra 5 mm port allow me ample exposure and the ability to perform meticulous dissection   We Started dividing the lesser omentum via the pars flaccida.  We Were able to dissect the lesser curvature of the stomach and  dissected the fundus free from the right and left crus.  We circumferentially dissected the GE junction. Please note that the fundus was displaced all the way up into the mediastinum and hernia sac had significant adhesion to the mediastinum. It required meticulous and extensive mediastinal dissection. The work required for a safe procedure was substantially greater than typically  required due to increased complexity of the giant hernia, technical difficulty, chronic inflammation  leading to significantly more time and effort from the provider. Mediastinal dissection alone took at least 60 minutes of total operative time. We did perform a very good dissection within the  mediastinum to allow a complete reduction of the sac, gain esophageal length and a to completely allow an intra-abdominal fundoplication. We did not require a lengthening. In order to reduce the sac  completely and to restore the stomach to the abdominal cavity the left gastric pedicle needle to be ligated with xlarge clips. THis maneuver allowed us  to bring the stomach to the appropriate anatomical position without creating tension on the repair.   Using two strips of Bio-A as pledgets we approximated the crus with a 2-0V Stratafix suture. A bio-A 10x7 cm mesh was inserted and secured using Vistaseal.   We Asked anesthesia to initially placed ICG via the OG, no evidence of esophageal or gastric perforation were seen, Anesthesia also placed sequential Bougies from 30 to a 50 French bougie and they went easily.  We also observe trajectory of the bougies. 320 degree fundoplication was created with multiple 2-0 silk sutures and we placed 3 stitches taking some of the esophagus within that bite.  The fundoplication measured approximately 3-1/2 cm and he was floppy. I was very happy with the way the fundoplication laid and the repair of the hernia.   Inspection of the  upper quadrant was performed. No bleeding, bile  Or esophageal injuries leaks, or bowel injuries were noted. Robotic instruments and robotic arms were undocked in the standard fashion. All the needles were removed under direct visualization.   I scrubbed back in.   Pneumoperitoneum was released.  The periumbilical port site was closed with interrumpted 0 Vicryl sutures. 4-0 subcuticular Monocryl was used to close the skin. Liposomal marcaine was injected to all the incisions sites.   Dermabond was  applied.  The patient was then extubated and brought to the recovery room in stable condition. Sponge, lap, and needle counts were correct at closure and at the conclusion of the case.               Laneta Luna, MD, FACS

## 2024-01-14 NOTE — Transfer of Care (Signed)
 Immediate Anesthesia Transfer of Care Note  Patient: Meghan Moody  Procedure(s) Performed: REPAIR, HERNIA, PARAESOPHAGEAL, ROBOT-ASSISTED INSERTION OF MESH  Patient Location: PACU  Anesthesia Type:General  Level of Consciousness: drowsy and patient cooperative  Airway & Oxygen Therapy: Patient Spontanous Breathing and Patient connected to face mask oxygen  Post-op Assessment: Report given to RN and Post -op Vital signs reviewed and stable  Post vital signs: Reviewed and stable  Last Vitals:  Vitals Value Taken Time  BP 123/56 01/14/24 10:23  Temp 36.5 C 01/14/24 10:22  Pulse 73 01/14/24 10:27  Resp 12 01/14/24 10:27  SpO2 98 % 01/14/24 10:27  Vitals shown include unfiled device data.  Last Pain:  Vitals:   01/14/24 1022  TempSrc:   PainSc: Asleep         Complications: No notable events documented.

## 2024-01-14 NOTE — Anesthesia Preprocedure Evaluation (Signed)
 Anesthesia Evaluation  Patient identified by MRN, date of birth, ID band Patient awake    Reviewed: Allergy & Precautions, NPO status , Patient's Chart, lab work & pertinent test results  History of Anesthesia Complications Negative for: history of anesthetic complications  Airway Mallampati: II   Neck ROM: Full    Dental  (+) Missing, Dental Advidsory Given   Pulmonary neg pulmonary ROS, former smoker   Pulmonary exam normal breath sounds clear to auscultation       Cardiovascular hypertension, (-) angina + Peripheral Vascular Disease  (-) Past MI and (-) Cardiac Stents Normal cardiovascular exam(-) dysrhythmias (-) Valvular Problems/Murmurs Rhythm:Regular Rate:Normal     Neuro/Psych HOH negative neurological ROS  negative psych ROS   GI/Hepatic Neg liver ROS, hiatal hernia,GERD  ,,  Endo/Other  negative endocrine ROS    Renal/GU Renal disease (stage III CKD)     Musculoskeletal   Abdominal   Peds  Hematology  (+) Blood dyscrasia, anemia   Anesthesia Other Findings Cardiology note 11/29/22:  ASSESSMENT AND PLAN  1.  Peripheral arterial disease: Left calf claudication due to occluded left SFA.  Her symptoms improved with cilostazol  and she currently has mild symptoms that are not lifestyle limiting.  Thus, no indication for revascularization.  Will refill cilostazol .  Continue as daily.  She has no symptoms of coronary artery disease.   I encouraged her to continue daily walking.   2.  Hyperlipidemia: Lipid profile showed an LDL of 78 which is worse at target.  Continue statin 40 mg daily.   3.  Essential hypertension: Blood pressure is controlled.  Disposition:   FU with me in 12 months  Reproductive/Obstetrics                              Anesthesia Physical Anesthesia Plan  ASA: 2  Anesthesia Plan: General   Post-op Pain Management:    Induction: Intravenous  PONV Risk  Score and Plan: 3 and Treatment may vary due to age or medical condition, Ondansetron  and Dexamethasone  Airway Management Planned: Oral ETT  Additional Equipment:   Intra-op Plan:   Post-operative Plan: Extubation in OR  Informed Consent: I have reviewed the patients History and Physical, chart, labs and discussed the procedure including the risks, benefits and alternatives for the proposed anesthesia with the patient or authorized representative who has indicated his/her understanding and acceptance.       Plan Discussed with: CRNA  Anesthesia Plan Comments: (LMA/GETA backup discussed.  Patient consented for risks of anesthesia including but not limited to:  - adverse reactions to medications - damage to eyes, teeth, lips or other oral mucosa - nerve damage due to positioning  - sore throat or hoarseness - damage to heart, brain, nerves, lungs, other parts of body or loss of life  Informed patient about role of CRNA in peri- and intra-operative care.  Patient voiced understanding.)         Anesthesia Quick Evaluation

## 2024-01-14 NOTE — Progress Notes (Signed)
 Taking pt to radiology for swallow test. NAD, denies pain.  RN with pt.

## 2024-01-14 NOTE — Anesthesia Postprocedure Evaluation (Signed)
 Anesthesia Post Note  Patient: Meghan Moody  Procedure(s) Performed: REPAIR, HERNIA, PARAESOPHAGEAL, ROBOT-ASSISTED INSERTION OF MESH  Patient location during evaluation: PACU Anesthesia Type: General Level of consciousness: awake and alert Pain management: pain level controlled Vital Signs Assessment: post-procedure vital signs reviewed and stable Respiratory status: spontaneous breathing, nonlabored ventilation and respiratory function stable Cardiovascular status: blood pressure returned to baseline and stable Postop Assessment: no apparent nausea or vomiting Anesthetic complications: no   No notable events documented.   Last Vitals:  Vitals:   01/14/24 1339 01/14/24 1438  BP: (!) 119/59 138/65  Pulse: 64 (!) 56  Resp: 16 15  Temp:  (!) 36.4 C  SpO2: 93% 95%    Last Pain:  Vitals:   01/14/24 1438  TempSrc: Oral  PainSc: 0-No pain                 [name]

## 2024-01-14 NOTE — Interval H&P Note (Signed)
 History and Physical Interval Note:  01/14/2024 7:18 AM  Meghan Moody  has presented today for surgery, with the diagnosis of Hiatal Hernia GERD.  The various methods of treatment have been discussed with the patient and family. After consideration of risks, benefits and other options for treatment, the patient has consented to  Procedure(s): REPAIR, HERNIA, PARAESOPHAGEAL, ROBOT-ASSISTED (N/A) INSERTION OF MESH as a surgical intervention.  The patient's history has been reviewed, patient examined, no change in status, stable for surgery.  I have reviewed the patient's chart and labs.  Questions were answered to the patient's satisfaction.     Meghan Moody [name]

## 2024-01-14 NOTE — Plan of Care (Signed)
  Problem: Pain Managment: Goal: General experience of comfort will improve and/or be controlled Outcome: Progressing

## 2024-01-14 NOTE — Anesthesia Procedure Notes (Signed)
 Procedure Name: Intubation Date/Time: 01/14/2024 7:32 AM  Performed by: Ledora Duncan, CRNAPre-anesthesia Checklist: Patient identified, Emergency Drugs available, Suction available and Patient being monitored Patient Re-evaluated:Patient Re-evaluated prior to induction Oxygen Delivery Method: Circle system utilized Preoxygenation: Pre-oxygenation with 100% oxygen Induction Type: IV induction Ventilation: Mask ventilation without difficulty Laryngoscope Size: McGrath and 3 Grade View: Grade I Tube type: Oral Tube size: 6.5 mm Number of attempts: 1 Airway Equipment and Method: Stylet Placement Confirmation: ETT inserted through vocal cords under direct vision, positive ETCO2 and breath sounds checked- equal and bilateral Secured at: 21 cm Tube secured with: Tape Dental Injury: Teeth and Oropharynx as per pre-operative assessment

## 2024-01-15 ENCOUNTER — Encounter: Payer: Self-pay | Admitting: Surgery

## 2024-01-15 ENCOUNTER — Encounter: Payer: Self-pay | Admitting: Oncology

## 2024-01-15 DIAGNOSIS — K449 Diaphragmatic hernia without obstruction or gangrene: Secondary | ICD-10-CM | POA: Diagnosis not present

## 2024-01-15 LAB — BASIC METABOLIC PANEL WITH GFR
Anion gap: 10 (ref 5–15)
BUN: 20 mg/dL (ref 8–23)
CO2: 26 mmol/L (ref 22–32)
Calcium: 8.8 mg/dL — ABNORMAL LOW (ref 8.9–10.3)
Chloride: 103 mmol/L (ref 98–111)
Creatinine, Ser: 0.93 mg/dL (ref 0.44–1.00)
GFR, Estimated: >60 mL/min (ref >60)
Glucose, Bld: 110 mg/dL — ABNORMAL HIGH (ref 70–99)
Potassium: 3.6 mmol/L (ref 3.5–5.1)
Sodium: 139 mmol/L (ref 135–145)

## 2024-01-15 LAB — CBC
HCT: 40.8 % (ref 36.0–46.0)
Hemoglobin: 13.2 g/dL (ref 12.0–15.0)
MCH: 28.5 pg (ref 26.0–34.0)
MCHC: 32.4 g/dL (ref 30.0–36.0)
MCV: 88.1 fL (ref 80.0–100.0)
Platelets: 192 K/uL (ref 150–400)
RBC: 4.63 MIL/uL (ref 3.87–5.11)
RDW: 19.5 % — ABNORMAL HIGH (ref 11.5–15.5)
WBC: 12.9 K/uL — ABNORMAL HIGH (ref 4.0–10.5)
nRBC: 0 % (ref 0.0–0.2)

## 2024-01-15 LAB — MAGNESIUM: Magnesium: 1.7 mg/dL (ref 1.7–2.4)

## 2024-01-15 LAB — SURGICAL PATHOLOGY

## 2024-01-15 MED ORDER — OXYCODONE HCL 5 MG PO TABS: 5.0000 mg | ORAL_TABLET | Freq: Four times a day (QID) | ORAL | 0 refills | Status: DC | PRN

## 2024-01-15 NOTE — Plan of Care (Signed)

## 2024-01-15 NOTE — Progress Notes (Signed)
 DISCHARGE NOTE:   Pt dc with IV removed and dc instructions given. Pt given ice pack and educated on diet. Pt voices no questions or concerns at this time.Pt wheeled down to medical mall entrance by staff. Pt's cousin provided transportation.

## 2024-01-15 NOTE — Discharge Instructions (Signed)
 In addition to included general post-operative instructions ,  Diet: Recommend following Nissen diet restrictions for at least 4 weeks after surgery. Handout given regarding this.   Activity: No heavy lifting >20 pounds (children, pets, laundry, garbage) or strenuous activity  for 4 weeks, but light activity and walking are encouraged. Do not drive or drink alcohol if taking narcotic pain medications or having pain that might distract from driving.  Wound care: You may shower/get incision wet with soapy water and pat dry (do not rub incisions), but no baths or submerging incision underwater until follow-up.   Medications: Resume all home medications. For mild to moderate pain: acetaminophen  (Tylenol ) or ibuprofen /naproxen (if no kidney disease). Combining Tylenol  with alcohol can substantially increase your risk of causing liver disease. Narcotic pain medications, if prescribed, can be used for severe pain, though may cause nausea, constipation, and drowsiness. Do not combine Tylenol  and Percocet (or similar) within a 6 hour period as Percocet (and similar) contain(s) Tylenol . If you do not need the narcotic pain medication, you do not need to fill the prescription.  Call office [phone] / [phone]) at any time if any questions, worsening pain, fevers/chills, bleeding, drainage from incision site, or other concerns.

## 2024-01-15 NOTE — Care Management Obs Status (Signed)
 MEDICARE OBSERVATION STATUS NOTIFICATION   Patient Details  Name: Meghan Moody MRN: [id number] Date of Birth: December 09, 1945   Medicare Observation Status Notification Given:  Yes    Rojelio [name] 01/15/2024, 11:08 AM

## 2024-01-15 NOTE — Discharge Summary (Signed)
 [facility] SURGICAL DISCHARGE SUMMARY  Patient ID: Meghan Moody MRN: [id number] DOB/AGE: 12-22-1945 78 y.o.  Admit date: 01/14/2024 Discharge date: 01/15/2024  Discharge Diagnoses Patient Active Problem List   Diagnosis Date Noted   S/P repair of paraesophageal hernia 01/14/2024    Consultants None  Procedures 01/14/2024:  Robotic assisted laparoscopic paraesophageal hernia repair Partial fundoplication  HPI: Meghan Moody s a 78 y.o. female with history of paraesophageal hernia who presents to [facility] on 09/30 for scheduled repair.   Hospital Course: Informed consent was obtained and documented, and patient underwent uneventful robotic assisted laparoscopic paraesophageal hernia repair and partial fundoplication (Dr Jordis, 01/14/2024).  Post-operatively, patient did very well. She did have esophogram which was without leak. No reports of trouble swallowing this AM. Advancement of patient's diet and ambulation were well-tolerated. The remainder of patient's hospital course was essentially unremarkable, and discharge planning was initiated accordingly with patient safely able to be discharged home with appropriate discharge instructions, pain control, and outpatient follow-up after all of her questions were answered to her expressed satisfaction.   Discharge Condition: Good   Physical Examination:  Constitutional: Well appearing female, NAD Pulmonary: Normal effort, no respiratory distress Gastrointestinal: Soft, minimal incisional soreness, non-distended, no rebound/guarding Skin: Laparoscopic incisions are CDI with dermabond, no erythema or drainage. Early ecchymosis.    Allergies as of 01/15/2024   No Known Allergies      Medication List     PAUSE taking these medications    omeprazole  40 MG capsule Wait to take this until your doctor or other care provider tells you to start again. Commonly known as: PRILOSEC  Take 40 mg by mouth daily.        TAKE these medications    acetaminophen  500 MG tablet Commonly known as: TYLENOL  Take 500 mg by mouth every 8 (eight) hours as needed for moderate pain (pain score 4-6).   aspirin  EC 81 MG tablet Take 1 tablet (81 mg total) by mouth daily. Swallow whole.   atorvastatin  40 MG tablet Commonly known as: LIPITOR Take 1 tablet (40 mg total) by mouth daily.   cilostazol  50 MG tablet Commonly known as: PLETAL  Take 1 tablet (50 mg total) by mouth 2 (two) times daily.   oxyCODONE 5 MG immediate release tablet Commonly known as: Oxy IR/ROXICODONE Take 1 tablet (5 mg total) by mouth every 6 (six) hours as needed for severe pain (pain score 7-10) or breakthrough pain.   triamterene -hydrochlorothiazide 37.5-25 MG tablet Commonly known as: MAXZIDE-25 Take 0.5 tablets by mouth daily.          Follow-up Information     Pabon, [place] F, MD. Schedule an appointment as soon as possible for a visit in 2 week(s).   Specialty: General Surgery Why: s/p paraesophageal hernia repair Contact information: [address] [place] [address] [phone]                  Time spent on discharge management including discussion of hospital course, clinical condition, outpatient instructions, prescriptions, and follow up with the patient and members of the medical team: >30 minutes  -- Arthea Platt , PA-C [facility]  01/15/2024, 9:30 AM [phone] M-F: 7am - 4pm

## 2024-01-16 ENCOUNTER — Encounter: Payer: Self-pay | Admitting: Surgery

## 2024-01-29 ENCOUNTER — Ambulatory Visit (INDEPENDENT_AMBULATORY_CARE_PROVIDER_SITE_OTHER): Admitting: Surgery

## 2024-01-29 VITALS — BP 131/82 | HR 80 | Temp 98.3°F | Ht 65.0 in | Wt 125.0 lb

## 2024-01-29 DIAGNOSIS — K449 Diaphragmatic hernia without obstruction or gangrene: Secondary | ICD-10-CM

## 2024-01-29 DIAGNOSIS — Z09 Encounter for follow-up examination after completed treatment for conditions other than malignant neoplasm: Secondary | ICD-10-CM

## 2024-01-29 NOTE — Patient Instructions (Signed)

## 2024-01-30 ENCOUNTER — Encounter: Payer: Self-pay | Admitting: Surgery

## 2024-01-30 NOTE — Progress Notes (Signed)
 Outpatient Surgical Follow Up    Meghan Moody is an 78 y.o. female.   Chief Complaint  Patient presents with   Routine Post Op    HPI: s/p rob repair giant paraesophageal H 2 weeks out, she is doing very well, no reflux, not on PPI , she is very happy and appreciative. Taking PO, no fevers or chills, no abd pain  Past Medical History:  Diagnosis Date   Aortic atherosclerosis    Arthritis    Chronic bronchitis (HCC)    Chronic Cameron ulcer    Chronic cough    Deafness in left ear    Former smoker    GERD (gastroesophageal reflux disease)    H/O hemorrhoidectomy    Hiatal hernia    Hyperlipidemia    Hypertension    Iron  deficiency anemia due to chronic blood loss    Long term current use of antithrombotics/antiplatelets (cilostazol )    Long-term use of aspirin  therapy    PAD (peripheral artery disease)    SOB (shortness of breath) on exertion     Past Surgical History:  Procedure Laterality Date   BREAST CYST ASPIRATION Bilateral    neg   BREAST EXCISIONAL BIOPSY Right 1978   neg   BUNIONECTOMY     CATARACT EXTRACTION W/PHACO Right 03/30/2019   Procedure: CATARACT EXTRACTION PHACO AND INTRAOCULAR LENS PLACEMENT (IOC) RIGHT, 1.23, 00:20.8;  Surgeon: Myrna Adine Anes, MD;  Location: [facility];  Service: Ophthalmology;  Laterality: Right;   CATARACT EXTRACTION W/PHACO Left 04/27/2019   Procedure: CATARACT EXTRACTION PHACO AND INTRAOCULAR LENS PLACEMENT (IOC) LEFT 1.46  00:20.3;  Surgeon: Myrna Adine Anes, MD;  Location: [facility];  Service: Ophthalmology;  Laterality: Left;   COLONOSCOPY WITH PROPOFOL  N/A 11/02/2019   Procedure: COLONOSCOPY WITH PROPOFOL ;  Surgeon: Unk Corinn Skiff, MD;  Location: [facility] ENDOSCOPY;  Service: Gastroenterology;  Laterality: N/A;   COLONOSCOPY WITH PROPOFOL  N/A 08/01/2021   Procedure: COLONOSCOPY WITH PROPOFOL ;  Surgeon: Unk Corinn Skiff, MD;  Location: [facility] ENDOSCOPY;  Service: Gastroenterology;  Laterality: N/A;    ESOPHAGOGASTRODUODENOSCOPY N/A 12/04/2023   Procedure: EGD (ESOPHAGOGASTRODUODENOSCOPY);  Surgeon: Unk Corinn Skiff, MD;  Location: [facility] ENDOSCOPY;  Service: Gastroenterology;  Laterality: N/A;   ESOPHAGOGASTRODUODENOSCOPY (EGD) WITH PROPOFOL  N/A 11/02/2019   Procedure: ESOPHAGOGASTRODUODENOSCOPY (EGD) WITH PROPOFOL ;  Surgeon: Unk Corinn Skiff, MD;  Location: [facility] END[facility]COPY;  Service: Gastroenterology;  Laterality: N/A;   ESOPHAGOGASTRODUODENOSCOPY (EGD) WITH PROPOFOL  N/A 08/01/2021   Procedure: ESOPHAGOGASTRODUODENOSCOPY (EGD) WITH PROPOFOL ;  Surgeon: Unk Corinn Skiff, MD;  Location: [facility] END[facility]COPY;  Service: Gastroenterology;  Laterality: N/A;  with Capsule placement   GIVENS CAPSULE STUDY N/A 08/01/2021   Procedure: GIVENS CAPSULE STUDY;  Surgeon: Unk Corinn Skiff, MD;  Location: [facility] ENDOSCOPY;  Service: Gastroenterology;  Laterality: N/A;   INSERTION OF MESH  01/14/2024   Procedure: INSERTION OF MESH;  Surgeon: Jordis Laneta FALCON, MD;  Location: [facility] ORS;  Service: General;;   TUBAL LIGATION     XI ROBOTIC ASSISTED PARAESOPHAGEAL HERNIA REPAIR N/A 01/14/2024   Procedure: REPAIR, HERNIA, PARAESOPHAGEAL, ROBOT-ASSISTED;  Surgeon: Jordis Laneta FALCON, MD;  Location: [facility] ORS;  Service: General;  Laterality: N/A;    Family History  Problem Relation Age of Onset   Coronary artery disease Mother    Heart disease Mother        Heart attack, 5 to 7 stents over 20 years passed 2020   Hypertension Mother    Stroke Father    Heart attack Father  Stroke passed 2005   Drug abuse Brother    Heart attack Brother        Stroke passed 2016   Stroke Brother    Heart attack Brother        Stroke Afib last event 6/23 currently in skilled nursing    Social History:  reports that she quit smoking about 17 years ago. Her smoking use included cigarettes. She started smoking about 57 years ago. She has a 12 pack-year smoking history. She has been exposed to tobacco smoke. She has never used smokeless  tobacco. She reports that she does not drink alcohol and does not use drugs.  Allergies: No Known Allergies  Medications reviewed.    ROS Full ROS performed and is otherwise negative other than what is stated in HPI   BP 131/82   Pulse 80   Temp 98.3 F (36.8 C) (Oral)   Ht 5' 5 (1.651 m)   Wt 125 lb (56.7 kg)   SpO2 94%   BMI 20.80 kg/m   Physical Exam NAD alert Abd: sof, nt, incisions c/d/I no infection or peritonitis  Assessment/Plan: Doing very well w/o complications Continue diet for another 2 weeks RTC in a couple of months  Laneta Luna, MD [facility] General Surgeon

## 2024-03-05 DIAGNOSIS — Z9889 Other specified postprocedural states: Secondary | ICD-10-CM | POA: Diagnosis not present

## 2024-03-05 DIAGNOSIS — D509 Iron deficiency anemia, unspecified: Secondary | ICD-10-CM | POA: Diagnosis not present

## 2024-03-05 DIAGNOSIS — Z862 Personal history of diseases of the blood and blood-forming organs and certain disorders involving the immune mechanism: Secondary | ICD-10-CM | POA: Diagnosis not present

## 2024-03-20 ENCOUNTER — Encounter: Payer: Self-pay | Admitting: Internal Medicine

## 2024-03-20 ENCOUNTER — Ambulatory Visit: Admitting: Internal Medicine

## 2024-03-20 DIAGNOSIS — J069 Acute upper respiratory infection, unspecified: Secondary | ICD-10-CM

## 2024-03-20 DIAGNOSIS — R300 Dysuria: Secondary | ICD-10-CM | POA: Diagnosis not present

## 2024-03-20 LAB — URINALYSIS, ROUTINE W REFLEX MICROSCOPIC
Bilirubin Urine: NEGATIVE
Hgb urine dipstick: NEGATIVE
Ketones, ur: NEGATIVE
Nitrite: POSITIVE — AB
Specific Gravity, Urine: 1.015 (ref 1.000–1.030)
Total Protein, Urine: NEGATIVE
Urine Glucose: NEGATIVE
Urobilinogen, UA: 1 (ref 0.0–1.0)
pH: 8.5 — AB (ref 5.0–8.0)

## 2024-03-20 MED ORDER — SULFAMETHOXAZOLE-TRIMETHOPRIM 800-160 MG PO TABS: 1.0000 | ORAL_TABLET | Freq: Two times a day (BID) | ORAL | 0 refills | Status: DC

## 2024-03-20 NOTE — Assessment & Plan Note (Signed)
 Empiric treatment for UTI with Septra  given that today is Friday.  AZO for symptoms of pain

## 2024-03-20 NOTE — Progress Notes (Signed)
 Subjective:  Patient ID: Meghan Moody, female    DOB: 09-Mar-1946  Age: 78 y.o. MRN: [id number]  CC: The primary encounter diagnosis was Dysuria. A diagnosis of Viral URI was also pertinent to this visit.   HPI Meghan Moody presents for  Chief Complaint  Patient presents with   Dysuria    Burning, frequency, and incontinence x 2 days   1) Dysuria, frequency  and incontinence for the last 2 days  no hematuria . Voiding only small amounts.    2) Scratchy throat   started yesterday  . No fevers, sinus pain,  ear pain.  Lymph nodes in neck tender . Shopped at [employer],  went to a church dinner on Monday.    Outpatient Medications Prior to Visit  Medication Sig Dispense Refill   acetaminophen  (TYLENOL ) 500 MG tablet Take 500 mg by mouth every 8 (eight) hours as needed for moderate pain (pain score 4-6).     aspirin  EC 81 MG tablet Take 1 tablet (81 mg total) by mouth daily. Swallow whole.     atorvastatin  (LIPITOR) 40 MG tablet Take 1 tablet (40 mg total) by mouth daily. 90 tablet 3   cilostazol  (PLETAL ) 50 MG tablet Take 1 tablet (50 mg total) by mouth 2 (two) times daily. 180 tablet 3   triamterene -hydrochlorothiazide  (MAXZIDE -25) 37.5-25 MG tablet Take 0.5 tablets by mouth daily. 45 tablet 3   omeprazole  (PRILOSEC ) 40 MG capsule Take 40 mg by mouth daily. (Patient not taking: Reported on 03/20/2024)     No facility-administered medications prior to visit.    Review of Systems;  Patient denies headache, fevers, malaise, unintentional weight loss, skin rash, eye pain, sinus congestion and sinus pain, sore throat, dysphagia,  hemoptysis , cough, dyspnea, wheezing, chest pain, palpitations, orthopnea, edema, abdominal pain, nausea, melena, diarrhea, constipation, flank pain, dysuria, hematuria, urinary  Frequency, nocturia, numbness, tingling, seizures,  Focal weakness, Loss of consciousness,  Tremor, insomnia, depression, anxiety, and suicidal ideation.      Objective:   There were no vitals taken for this visit.  BP Readings from Last 3 Encounters:  01/29/24 131/82  01/15/24 127/73  01/09/24 135/83    Wt Readings from Last 3 Encounters:  01/29/24 125 lb (56.7 kg)  01/14/24 129 lb 11.2 oz (58.8 kg)  01/09/24 129 lb 11.2 oz (58.8 kg)    Physical Exam Vitals reviewed.  Constitutional:      General: She is not in acute distress.    Appearance: Normal appearance. She is normal weight. She is not ill-appearing, toxic-appearing or diaphoretic.  HENT:     Head: Normocephalic.  Eyes:     General: No scleral icterus.       Right eye: No discharge.        Left eye: No discharge.     Conjunctiva/sclera: Conjunctivae normal.  Musculoskeletal:        General: Normal range of motion.  Skin:    General: Skin is warm and dry.  Neurological:     General: No focal deficit present.     Mental Status: She is alert and oriented to person, place, and time. Mental status is at baseline.  Psychiatric:        Mood and Affect: Mood normal.        Behavior: Behavior normal.        Thought Content: Thought content normal.        Judgment: Judgment normal.     No results found for: HGBA1C  Lab Results  Component Value Date   CREATININE 0.93 01/15/2024   CREATININE 1.06 12/10/2023   CREATININE 1.25 (H) 09/26/2023    Lab Results  Component Value Date   WBC 12.9 (H) 01/15/2024   HGB 13.2 01/15/2024   HCT 40.8 01/15/2024   PLT 192 01/15/2024   GLUCOSE 110 (H) 01/15/2024   CHOL 151 08/29/2023   TRIG 80.0 08/29/2023   HDL 61.60 08/29/2023   LDLDIRECT 163.1 11/10/2012   LDLCALC 74 08/29/2023   ALT 20 08/29/2023   AST 24 08/29/2023   NA 139 01/15/2024   K 3.6 01/15/2024   CL 103 01/15/2024   CREATININE 0.93 01/15/2024   BUN 20 01/15/2024   CO2 26 01/15/2024   TSH 1.37 08/29/2023   MICROALBUR 1.1 12/10/2023    DG ESOPHAGUS W SINGLE CM (SOL OR THIN BA) Result Date: 01/14/2024 CLINICAL DATA:  78 year old female with a history of large,  symptomatic hiatal hernia s/p [place] off a GIA hernia repair with partial fundoplication on 01/14/2024 with Dr. Laneta Luna. Request for esophagram to evaluate for postoperative leak. EXAM: ESOPHAGUS/BARIUM SWALLOW/TABLET STUDY TECHNIQUE: Single contrast examination was performed using thin liquid barium. This exam was performed by [facility] PA-C, and was supervised and interpreted by Dr. KANDICE Banner. FLUOROSCOPY: Radiation Exposure Index (as provided by the fluoroscopic device): 19.0 mGy Kerma COMPARISON:  DG ESOPHAGUS W DOUBLE CM - 11/21/23 FINDINGS: Swallowing: Appears normal. No vestibular penetration or aspiration seen. Pharynx: Unremarkable. Esophageal motility: Within normal limits. Hiatal Hernia: Expected postoperative changes from hiatal hernia repair with partial fundoplication. Unable to fully visualize the GE junction, but only a trace amount of contrast was able to pass into the stomach after approximately 12 minutes. Patient was rolled in both LPO and RPO positions to assist in passage of contrast; however, after 15 minutes only a small amount still able to pass through the GE junction. Gastroesophageal reflux: None visualized. Other: Limited exam due to recent sedation. Entire procedure performed at approximately 26 degree table tilt. No gross contrast leak or extravasation noted during exam. IMPRESSION: Limited evaluation due to soft tissue swelling and trace amount of contrast passing through the GE junction; however, no gross contrast leak or extravasation. Recommend follow-up imaging for better visualization. Electronically Signed   By: Cordella Banner   On: 01/14/2024 14:04   DG Abd Portable 1V Result Date: 01/14/2024 CLINICAL DATA:  Repair of paraesophageal hernia EXAM: PORTABLE ABDOMEN - 1 VIEW COMPARISON:  Esophagram performed January 14, 2024 FINDINGS: The chest is not captured in the provided images. A small volume of contrast projects over the upper abdomen, likely within the  proximal stomach. Mild diffuse gaseous distention of the bowel. A moderate volume of stool material is present throughout the colon. A small volume of contrast agent projects over the left pelvis. IMPRESSION: 1. The chest is not captured on the provided images. 2. Nonspecific bowel gas pattern. 3. A small volume of contrast agent is detected in the proximal stomach and left pelvis. Electronically Signed   By: Maude Naegeli M.D.   On: 01/14/2024 13:53    Assessment & Plan:  .Dysuria Assessment & Plan: Empiric treatment for UTI with Septra  given that today is Friday.  AZO for symptoms of pain   Orders: -     Urine Culture -     Urinalysis, Routine w reflex microscopic -     Urine Microscopic  Viral URI Assessment & Plan: URI is most likely viral given the mild HEENT Symptoms (currently iwht  scratchy throat for the last 12 hours )b   And normal exam.   I have explained that in viral URIS, an antibiotic will not help the symptoms and will increase the risk of developing diarrhea.,  Continue oral and nasal decongestants,prn tylenol  650 mq 8 hrs for aches and pains,  mucinex and delsym if cough develops    Other orders -     Sulfamethoxazole -Trimethoprim ; Take 1 tablet by mouth 2 (two) times daily.  Dispense: 6 tablet; Refill: 0     I spent 34 minutes on the day of this face to face encounter reviewing patient's  most recent visit with cardiology,  nephrology,  and neurology,  prior relevant surgical and non surgical procedures, recent  labs and imaging studies, counseling on weight management,  reviewing the assessment and plan with patient, and post visit ordering and reviewing of  diagnostics and therapeutics with patient  .   Follow-up: No follow-ups on file.   Verneita LITTIE Kettering, MD

## 2024-03-20 NOTE — Assessment & Plan Note (Signed)
 URI is most likely viral given the mild HEENT Symptoms (currently iwht scratchy throat for the last 12 hours )b   And normal exam.   I have explained that in viral URIS, an antibiotic will not help the symptoms and will increase the risk of developing diarrhea.,  Continue oral and nasal decongestants,prn tylenol  650 mq 8 hrs for aches and pains,  mucinex and delsym if cough develops

## 2024-03-20 NOTE — Patient Instructions (Signed)
 I am treating  you for a  Urinary tract infection  With Septra  DS ,  .  please take it twice daily for 3 days with food.  You can use AZO   for the burning,  It will make your urine orange colored.   Please take a probiotic ( Align, Flora que or Culturelle) or eat  a serving of yogurt daily for  2-3 weeks  to prevent a serious antibiotic associated diarrhea  Called clostridium difficile colitis ( should also help prevent   vaginal yeast infection)     You have a viral  Syndrome .  The post nasal drip  and swollen lymph nodes in your throat are  causing your sore throat   Gargle with salt water as needed for the sore throat  Your cold remedy has benadryl  25 mg  in the nighttime dose which will help the drippy nose but will make you sleepy .  You can use allegra claritin or zyrtec for daytime drippy nose  The daytime medicine has phenylephrine  as the decongestant,. This can cause you to feel jittery and can raise your blood pressure for a little while so be aware.   .If you develop T > 100.4,  Green nasal discharge,  Or facial pain,  call the office

## 2024-03-23 ENCOUNTER — Ambulatory Visit: Payer: Self-pay | Admitting: Internal Medicine

## 2024-03-23 LAB — URINE CULTURE
MICRO NUMBER:: 17318823
SPECIMEN QUALITY:: ADEQUATE

## 2024-04-18 ENCOUNTER — Encounter: Payer: Self-pay | Admitting: Oncology

## 2024-04-21 ENCOUNTER — Encounter: Payer: Self-pay | Admitting: Oncology

## 2024-04-27 ENCOUNTER — Telehealth: Payer: Self-pay | Admitting: Oncology

## 2024-04-27 NOTE — Telephone Encounter (Signed)
 Pt called to r/s lab for 1/26 - pt confirmed new time - [facility]

## 2024-05-08 ENCOUNTER — Other Ambulatory Visit: Payer: Self-pay | Admitting: *Deleted

## 2024-05-08 ENCOUNTER — Inpatient Hospital Stay: Payer: Self-pay | Attending: Oncology

## 2024-05-08 DIAGNOSIS — D509 Iron deficiency anemia, unspecified: Secondary | ICD-10-CM

## 2024-05-08 LAB — IRON AND TIBC
Iron: 122 ug/dL (ref 28–170)
Saturation Ratios: 31 % (ref 10.4–31.8)
TIBC: 391 ug/dL (ref 250–450)
UIBC: 269 ug/dL

## 2024-05-08 LAB — CBC WITH DIFFERENTIAL/PLATELET
Abs Immature Granulocytes: 0.03 K/uL (ref 0.00–0.07)
Basophils Absolute: 0.1 K/uL (ref 0.0–0.1)
Basophils Relative: 2 %
Eosinophils Absolute: 0.2 K/uL (ref 0.0–0.5)
Eosinophils Relative: 2 %
HCT: 46.7 % — ABNORMAL HIGH (ref 36.0–46.0)
Hemoglobin: 15 g/dL (ref 12.0–15.0)
Immature Granulocytes: 0 %
Lymphocytes Relative: 17 %
Lymphs Abs: 1.2 K/uL (ref 0.7–4.0)
MCH: 30.8 pg (ref 26.0–34.0)
MCHC: 32.1 g/dL (ref 30.0–36.0)
MCV: 95.9 fL (ref 80.0–100.0)
Monocytes Absolute: 0.6 K/uL (ref 0.1–1.0)
Monocytes Relative: 8 %
Neutro Abs: 5.1 K/uL (ref 1.7–7.7)
Neutrophils Relative %: 71 %
Platelets: 212 K/uL (ref 150–400)
RBC: 4.87 MIL/uL (ref 3.87–5.11)
RDW: 14.9 % (ref 11.5–15.5)
WBC: 7.2 K/uL (ref 4.0–10.5)
nRBC: 0 % (ref 0.0–0.2)

## 2024-05-08 LAB — FERRITIN: Ferritin: 79 ng/mL (ref 11–307)

## 2024-05-11 ENCOUNTER — Ambulatory Visit: Admitting: Surgery

## 2024-05-11 ENCOUNTER — Other Ambulatory Visit

## 2024-05-11 ENCOUNTER — Inpatient Hospital Stay: Payer: Self-pay

## 2024-05-12 ENCOUNTER — Inpatient Hospital Stay: Admitting: Oncology

## 2024-05-12 ENCOUNTER — Inpatient Hospital Stay

## 2024-05-18 ENCOUNTER — Ambulatory Visit: Admitting: Surgery

## 2024-05-20 ENCOUNTER — Ambulatory Visit: Admitting: Surgery

## 2024-05-20 ENCOUNTER — Encounter: Payer: Self-pay | Admitting: Surgery

## 2024-05-20 VITALS — BP 110/75 | HR 90 | Ht 65.0 in | Wt 130.0 lb

## 2024-05-20 DIAGNOSIS — K449 Diaphragmatic hernia without obstruction or gangrene: Secondary | ICD-10-CM

## 2024-05-20 DIAGNOSIS — Z09 Encounter for follow-up examination after completed treatment for conditions other than malignant neoplasm: Secondary | ICD-10-CM

## 2024-05-20 NOTE — Patient Instructions (Signed)
Please call our office if you have any questions or concerns.  

## 2024-05-20 NOTE — Progress Notes (Unsigned)
 Outpatient Surgical Follow Up  05/20/2024  Meghan Moody is an 79 y.o. female.   Chief Complaint  Patient presents with   Follow-up    HPI: ***  Past Medical History:  Diagnosis Date   Aortic atherosclerosis    Arthritis    Chronic bronchitis (HCC)    Chronic Cameron ulcer    Chronic cough    Deafness in left ear    Former smoker    GERD (gastroesophageal reflux disease)    H/O hemorrhoidectomy    Hiatal hernia    Hyperlipidemia    Hypertension    Iron  deficiency anemia due to chronic blood loss    Long term current use of antithrombotics/antiplatelets (cilostazol )    Long-term use of aspirin  therapy    PAD (peripheral artery disease)    SOB (shortness of breath) on exertion     Past Surgical History:  Procedure Laterality Date   BREAST CYST ASPIRATION Bilateral    neg   BREAST EXCISIONAL BIOPSY Right 1978   neg   BUNIONECTOMY     CATARACT EXTRACTION W/PHACO Right 03/30/2019   Procedure: CATARACT EXTRACTION PHACO AND INTRAOCULAR LENS PLACEMENT (IOC) RIGHT, 1.23, 00:20.8;  Surgeon: Myrna Adine Anes, MD;  Location: [facility];  Service: Ophthalmology;  Laterality: Right;   CATARACT EXTRACTION W/PHACO Left 04/27/2019   Procedure: CATARACT EXTRACTION PHACO AND INTRAOCULAR LENS PLACEMENT (IOC) LEFT 1.46  00:20.3;  Surgeon: Myrna Adine Anes, MD;  Location: [facility];  Service: Ophthalmology;  Laterality: Left;   COLONOSCOPY WITH PROPOFOL  N/A 11/02/2019   Procedure: COLONOSCOPY WITH PROPOFOL ;  Surgeon: Unk Corinn Skiff, MD;  Location: [facility] ENDOSCOPY;  Service: Gastroenterology;  Laterality: N/A;   COLONOSCOPY WITH PROPOFOL  N/A 08/01/2021   Procedure: COLONOSCOPY WITH PROPOFOL ;  Surgeon: Unk Corinn Skiff, MD;  Location: [facility] ENDOSCOPY;  Service: Gastroenterology;  Laterality: N/A;   ESOPHAGOGASTRODUODENOSCOPY N/A 12/04/2023   Procedure: EGD (ESOPHAGOGASTRODUODENOSCOPY);  Surgeon: Unk Corinn Skiff, MD;  Location: [facility]  ENDOSCOPY;  Service: Gastroenterology;  Laterality: N/A;   ESOPHAGOGASTRODUODENOSCOPY (EGD) WITH PROPOFOL  N/A 11/02/2019   Procedure: ESOPHAGOGASTRODUODENOSCOPY (EGD) WITH PROPOFOL ;  Surgeon: Unk Corinn Skiff, MD;  Location: [facility] ENDOSCOPY;  Service: Gastroenterology;  Laterality: N/A;   ESOPHAGOGASTRODUODENOSCOPY (EGD) WITH PROPOFOL  N/A 08/01/2021   Procedure: ESOPHAGOGASTRODUODENOSCOPY (EGD) WITH PROPOFOL ;  Surgeon: Unk Corinn Skiff, MD;  Location: [facility] ENDOSCOPY;  Service: Gastroenterology;  Laterality: N/A;  with Capsule placement   GIVENS CAPSULE STUDY N/A 08/01/2021   Procedure: GIVENS CAPSULE STUDY;  Surgeon: Unk Corinn Skiff, MD;  Location: [facility] ENDOSCOPY;  Service: Gastroenterology;  Laterality: N/A;   INSERTION OF MESH  01/14/2024   Procedure: INSERTION OF MESH;  Surgeon: Jordis Laneta FALCON, MD;  Location: [facility] ORS;  Service: General;;   TUBAL LIGATION     XI ROBOTIC ASSISTED PARAESOPHAGEAL HERNIA REPAIR N/A 01/14/2024   Procedure: REPAIR, HERNIA, PARAESOPHAGEAL, ROBOT-ASSISTED;  Surgeon: Jordis Laneta FALCON, MD;  Location: [facility] ORS;  Service: General;  Laterality: N/A;    Family History  Problem Relation Age of Onset   Coronary artery disease Mother    Heart disease Mother        Heart attack, 5 to 7 stents over 20 years passed 2020   Hypertension Mother    Stroke Father    Heart attack Father        Stroke passed 2005   Drug abuse Brother    Heart attack Brother        Stroke passed 2016   Stroke Brother    Heart attack Brother  Stroke Afib last event 6/23 currently in skilled nursing    Social History:  reports that she quit smoking about 18 years ago. Her smoking use included cigarettes. She started smoking about 58 years ago. She has a 12 pack-year smoking history. She has been exposed to tobacco smoke. She has never used smokeless tobacco. She reports that she does not drink alcohol and does not use drugs.  Allergies: Allergies[1]  Medications  reviewed.    ROS Full ROS performed and is otherwise negative other than what is stated in HPI   BP 110/75   Pulse 90   Ht 5' 5 (1.651 m)   Wt 130 lb (59 kg)   SpO2 98%   BMI 21.63 kg/m   Physical Exam     Assessment/Plan: I personally spent a total of 20 minutes in the care of the patient today including performing a medically appropriate exam/evaluation, counseling and educating, placing orders, referring and communicating with other health care professionals, documenting clinical information in the EHR, independently interpreting and reviewing images studies and coordinating care.   Laneta Luna, MD FACS General Surgeon     [1] No Known Allergies

## 2024-08-10 ENCOUNTER — Inpatient Hospital Stay

## 2024-08-12 ENCOUNTER — Inpatient Hospital Stay

## 2024-08-12 ENCOUNTER — Inpatient Hospital Stay: Admitting: Oncology

## 2024-08-13 ENCOUNTER — Inpatient Hospital Stay: Admitting: Oncology
